# Patient Record
Sex: Female | Born: 1985 | Race: White | Hispanic: No | Smoking: Current every day smoker
Health system: Southern US, Community
[De-identification: ages and names within clinical notes are randomized; demographics above are authoritative.]

## PROBLEM LIST (undated history)

## (undated) ENCOUNTER — Inpatient Hospital Stay: Payer: Self-pay

## (undated) DIAGNOSIS — R51 Headache: Secondary | ICD-10-CM

## (undated) DIAGNOSIS — M797 Fibromyalgia: Secondary | ICD-10-CM

## (undated) DIAGNOSIS — M1990 Unspecified osteoarthritis, unspecified site: Secondary | ICD-10-CM

## (undated) DIAGNOSIS — F988 Other specified behavioral and emotional disorders with onset usually occurring in childhood and adolescence: Secondary | ICD-10-CM

## (undated) DIAGNOSIS — K219 Gastro-esophageal reflux disease without esophagitis: Secondary | ICD-10-CM

## (undated) DIAGNOSIS — J45909 Unspecified asthma, uncomplicated: Secondary | ICD-10-CM

## (undated) DIAGNOSIS — R519 Headache, unspecified: Secondary | ICD-10-CM

## (undated) DIAGNOSIS — F419 Anxiety disorder, unspecified: Secondary | ICD-10-CM

## (undated) DIAGNOSIS — T7840XA Allergy, unspecified, initial encounter: Secondary | ICD-10-CM

## (undated) HISTORY — PX: ESOPHAGOGASTRODUODENOSCOPY: SHX1529

## (undated) HISTORY — PX: WISDOM TOOTH EXTRACTION: SHX21

## (undated) HISTORY — DX: Unspecified asthma, uncomplicated: J45.909

## (undated) HISTORY — PX: SMALL INTESTINE SURGERY: SHX150

## (undated) HISTORY — PX: COLON SURGERY: SHX602

## (undated) HISTORY — DX: Anxiety disorder, unspecified: F41.9

## (undated) HISTORY — PX: TUBAL LIGATION: SHX77

## (undated) HISTORY — DX: Allergy, unspecified, initial encounter: T78.40XA

---

## 2017-06-10 NOTE — L&D Delivery Note (Addendum)
Date of delivery: 06/08/18 Estimated Date of Delivery: 06/01/18 Patient's last menstrual period was 08/25/2017. EGA: [redacted]w[redacted]d  Delivery Note At 12:14 AM a viable female was delivered via Vaginal, Spontaneous (Presentation: cephalic; right occiput transverse).  APGAR: 8, 9; weight pending while skin-to-skin.   Placenta status: spontaneous, intact.  Cord: 3vv with the following complications: none .  Cord pH: not collected  Anesthesia:  epidural Episiotomy: None Lacerations: 1st degree, hemostatic Suture Repair: none Est. Blood Loss (mL):  59cc  Mom presented to L&D with Induction of labor for past due date.  She was given cytotec, AROM'd for meconium, antibiotics for GBS+, then augmented with pitocin.  epidual placed.  Progressed to complete, second stage: 1hour labor down and then active 2nd stage 30 minutes.  Baby was delivered by RN while I was in another delivery.  Per her report, delivery of fetal head followed quickly by the rest of the body.  Baby placed on mom's chest, and attended to by peds. Cord was then clamped and cut by FOB.  I returned for the remainder of the procedure.  Placenta spontaneously delivered, intact.   IV pitocin given for hemorrhage prophylaxis.  Superficial 1st degree was not repaired as it was superficial and hemostatic.   We sang happy birthday to baby [place].   Mom to postpartum.  Baby to Couplet care / Skin to Skin.  Shina Wass C Dariah Mcsorley 06/08/2018, 1:06 AM

## 2017-10-27 LAB — OB RESULTS CONSOLE HIV ANTIBODY (ROUTINE TESTING): HIV: NONREACTIVE

## 2017-10-28 LAB — OB RESULTS CONSOLE RUBELLA ANTIBODY, IGM: Rubella: IMMUNE

## 2017-10-28 LAB — OB RESULTS CONSOLE VARICELLA ZOSTER ANTIBODY, IGG: Varicella: IMMUNE

## 2017-10-28 LAB — OB RESULTS CONSOLE ABO/RH: RH Type: POSITIVE

## 2017-10-28 LAB — OB RESULTS CONSOLE RPR: RPR: NONREACTIVE

## 2017-10-28 LAB — OB RESULTS CONSOLE HEPATITIS B SURFACE ANTIGEN: HEP B S AG: NEGATIVE

## 2017-10-28 LAB — OB RESULTS CONSOLE ANTIBODY SCREEN: Antibody Screen: NEGATIVE

## 2017-10-29 LAB — OB RESULTS CONSOLE GC/CHLAMYDIA
Chlamydia: NEGATIVE
GC PROBE AMP, GENITAL: NEGATIVE

## 2017-11-17 DIAGNOSIS — O3680X1 Pregnancy with inconclusive fetal viability, fetus 1: Secondary | ICD-10-CM | POA: Insufficient documentation

## 2018-03-02 LAB — OB RESULTS CONSOLE HIV ANTIBODY (ROUTINE TESTING): HIV: NONREACTIVE

## 2018-03-03 LAB — OB RESULTS CONSOLE RPR: RPR: NONREACTIVE

## 2018-05-12 LAB — OB RESULTS CONSOLE GC/CHLAMYDIA
Chlamydia: NEGATIVE
Gonorrhea: NEGATIVE

## 2018-05-13 LAB — OB RESULTS CONSOLE GBS: GBS: POSITIVE

## 2018-05-25 ENCOUNTER — Other Ambulatory Visit: Payer: Self-pay

## 2018-05-25 ENCOUNTER — Observation Stay
Admission: EM | Admit: 2018-05-25 | Discharge: 2018-05-25 | Disposition: A | Discharge status: Home / Self Care | Payer: Medicaid Other | Attending: Obstetrics and Gynecology | Admitting: Obstetrics and Gynecology

## 2018-05-25 DIAGNOSIS — Z3A39 39 weeks gestation of pregnancy: Secondary | ICD-10-CM | POA: Insufficient documentation

## 2018-05-25 DIAGNOSIS — O471 False labor at or after 37 completed weeks of gestation: Principal | ICD-10-CM | POA: Insufficient documentation

## 2018-05-25 MED ORDER — ACETAMINOPHEN 325 MG PO TABS: 650.0000 mg | ORAL_TABLET | ORAL | Status: DC | PRN

## 2018-05-25 MED ORDER — CALCIUM CARBONATE ANTACID 500 MG PO CHEW: 2.0000 | CHEWABLE_TABLET | ORAL | Status: DC | PRN

## 2018-05-25 MED ORDER — DOCUSATE SODIUM 100 MG PO CAPS: 100.0000 mg | ORAL_CAPSULE | Freq: Every day | ORAL | Status: DC

## 2018-05-25 MED ORDER — ZOLPIDEM TARTRATE 5 MG PO TABS: 5.0000 mg | ORAL_TABLET | Freq: Every evening | ORAL | Status: DC | PRN

## 2018-05-25 MED ORDER — PRENATAL MULTIVITAMIN CH: 1.0000 | ORAL_TABLET | Freq: Every day | ORAL | Status: DC

## 2018-05-25 NOTE — Final Progress Note (Signed)
TRIAGE VISIT with NST   Megan Bennett is a 32 y.o. G1P0. She is at 39+0 wks with EDC of 06/01/18 by LMP.12 wk ultrasound presenting with signs of labor.  Indication: Contractions  S: Resting comfortably. Intermittent CTX, no VB. Active fetal movement. Concerned about labor and discharge/fluid from vagina  O:  BP 125/81 (BP Location: Left Arm)   Pulse 98   Temp 98.3 F (36.8 C) (Oral)   Resp 16   Ht 5\' 7"  (1.702 m)   Wt 116.1 kg   BMI 40.10 kg/m  No results found for this or any previous visit (from the past 48 hour(s)).   Gen: NAD, AAOx3      Abd: FNTTP      Ext: Non-tender, Nonedmeatous    NST/FHT: 135, mod var, +accels, no decels TOCO: quiet EVO:JJKKXFGH: 1 Effacement (%): 40, 50 Cervical Position: Middle Station: -3 Exam by:: [employer] RN  NST: Reactive. See FHT above for particulars.  A/P:  32 y.o. G1P0  At   Labor: not present.   R/o ROM: SSE negative x 3. Wet prep neg for clue cells. Normal amount of white discharge.   Fetal Wellbeing: NST reactive Reassuring Cat 1 tracing.  D/c home stable, precautions reviewed, follow-up as scheduled.

## 2018-06-05 ENCOUNTER — Other Ambulatory Visit: Payer: Self-pay

## 2018-06-05 ENCOUNTER — Inpatient Hospital Stay
Admission: EM | Admit: 2018-06-05 | Discharge: 2018-06-05 | Disposition: A | Discharge status: Home / Self Care | Payer: Medicaid Other | Attending: Obstetrics and Gynecology | Admitting: Obstetrics and Gynecology

## 2018-06-05 DIAGNOSIS — O368130 Decreased fetal movements, third trimester, not applicable or unspecified: Secondary | ICD-10-CM | POA: Insufficient documentation

## 2018-06-05 DIAGNOSIS — Z3A41 41 weeks gestation of pregnancy: Secondary | ICD-10-CM | POA: Insufficient documentation

## 2018-06-05 DIAGNOSIS — O368190 Decreased fetal movements, unspecified trimester, not applicable or unspecified: Secondary | ICD-10-CM

## 2018-06-05 NOTE — OB Triage Note (Signed)
Pt presented to L/D with reported decreased fetal movement since the beginning of the week. No bleeding or leaking of fluid noted. Intermittent, sharp pelvic pain noted that has been present for two weeks. Last intercourse 05/30/18. Vitals stable. Will continue to monitor.

## 2018-06-05 NOTE — Final Progress Note (Signed)
Triage Visit with NST    SHAKIARA LUKIC is a 32 y.o. G1P0. She is at 41+0wks Indication: Decreased fetal movement  S: Resting comfortably. no CTX, no VB.  - Patient is now feeling baby  Move well but <10 movements in 1 hr Induction is scheduled for Sunday morning  :  BP 131/70   Pulse 78   Temp 98.4 F (36.9 C) (Oral)   Resp 18   Ht 5\' 7"  (1.702 m)   Wt 116.6 kg   LMP  (LMP Unknown)   BMI 40.25 kg/m  No results found for this or any previous visit (from the past 84 hour(s)).   Gen: NAD, AAOx3      Abd: FNTTP      Ext: Non-tender, Nonedmeatous    FHT: 135, moderate variability, +accels x2, no decels TOCO: quiet SVE: 3/60/-3, mid position and soft  Bedside AFI: 37.4MO, cephalic   A/P:  32 y.o. L0B8 at 41 wks with concerns for decreased fetal movement, now resolved.   Labor: not present.   Fetal Wellbeing: NST is Reassuring reactive tracing   D/c home stable, precautions reviewed, follow-up as scheduled.

## 2018-06-05 NOTE — Discharge Instructions (Signed)

## 2018-06-07 ENCOUNTER — Encounter: Payer: Self-pay | Admitting: *Deleted

## 2018-06-07 ENCOUNTER — Inpatient Hospital Stay
Admission: EM | Admit: 2018-06-07 | Discharge: 2018-06-09 | DRG: 807 | Disposition: A | Discharge status: Home / Self Care | Payer: Medicaid Other | Attending: Obstetrics & Gynecology | Admitting: Obstetrics & Gynecology

## 2018-06-07 ENCOUNTER — Inpatient Hospital Stay: Payer: Medicaid Other | Admitting: Anesthesiology

## 2018-06-07 ENCOUNTER — Other Ambulatory Visit: Payer: Self-pay

## 2018-06-07 DIAGNOSIS — O134 Gestational [pregnancy-induced] hypertension without significant proteinuria, complicating childbirth: Secondary | ICD-10-CM | POA: Diagnosis present

## 2018-06-07 DIAGNOSIS — O99334 Smoking (tobacco) complicating childbirth: Secondary | ICD-10-CM | POA: Diagnosis present

## 2018-06-07 DIAGNOSIS — O99214 Obesity complicating childbirth: Secondary | ICD-10-CM | POA: Diagnosis present

## 2018-06-07 DIAGNOSIS — F17210 Nicotine dependence, cigarettes, uncomplicated: Secondary | ICD-10-CM | POA: Diagnosis present

## 2018-06-07 DIAGNOSIS — Z3A40 40 weeks gestation of pregnancy: Secondary | ICD-10-CM | POA: Diagnosis not present

## 2018-06-07 DIAGNOSIS — O480 Post-term pregnancy: Secondary | ICD-10-CM | POA: Diagnosis present

## 2018-06-07 DIAGNOSIS — O99824 Streptococcus B carrier state complicating childbirth: Secondary | ICD-10-CM | POA: Diagnosis present

## 2018-06-07 HISTORY — DX: Fibromyalgia: M79.7

## 2018-06-07 LAB — COMPREHENSIVE METABOLIC PANEL
ALT: 15 U/L (ref 0–44)
AST: 18 U/L (ref 15–41)
Albumin: 2.9 g/dL — ABNORMAL LOW (ref 3.5–5.0)
Alkaline Phosphatase: 112 U/L (ref 38–126)
Anion gap: 8 (ref 5–15)
BUN: 8 mg/dL (ref 6–20)
CHLORIDE: 108 mmol/L (ref 98–111)
CO2: 18 mmol/L — AB (ref 22–32)
Calcium: 9.3 mg/dL (ref 8.9–10.3)
Creatinine, Ser: 0.49 mg/dL (ref 0.44–1.00)
GFR calc Af Amer: >60 mL/min (ref >60)
GFR calc non Af Amer: >60 mL/min (ref >60)
Glucose, Bld: 86 mg/dL (ref 70–99)
Potassium: 3.9 mmol/L (ref 3.5–5.1)
Sodium: 134 mmol/L — ABNORMAL LOW (ref 135–145)
Total Bilirubin: 0.7 mg/dL (ref 0.3–1.2)
Total Protein: 6.3 g/dL — ABNORMAL LOW (ref 6.5–8.1)

## 2018-06-07 LAB — PROTEIN / CREATININE RATIO, URINE
Creatinine, Urine: 16 mg/dL
Total Protein, Urine: <6 mg/dL

## 2018-06-07 LAB — CBC
HCT: 42.1 % (ref 36.0–46.0)
Hemoglobin: 14.5 g/dL (ref 12.0–15.0)
MCH: 33.5 pg (ref 26.0–34.0)
MCHC: 34.4 g/dL (ref 30.0–36.0)
MCV: 97.2 fL (ref 80.0–100.0)
PLATELETS: 289 10*3/uL (ref 150–400)
RBC: 4.33 MIL/uL (ref 3.87–5.11)
RDW: 12.7 % (ref 11.5–15.5)
WBC: 17.6 10*3/uL — AB (ref 4.0–10.5)
nRBC: 0 % (ref 0.0–0.2)

## 2018-06-07 LAB — TYPE AND SCREEN
ABO/RH(D): O POS
Antibody Screen: NEGATIVE

## 2018-06-07 MED ORDER — PHENYLEPHRINE 40 MCG/ML (10ML) SYRINGE FOR IV PUSH (FOR BLOOD PRESSURE SUPPORT): 80.0000 ug | PREFILLED_SYRINGE | INTRAVENOUS | Status: DC | PRN

## 2018-06-07 MED ORDER — ACETAMINOPHEN 325 MG PO TABS: 650.0000 mg | ORAL_TABLET | ORAL | Status: DC | PRN

## 2018-06-07 MED ORDER — MISOPROSTOL 25 MCG QUARTER TABLET
25.0000 ug | ORAL_TABLET | Freq: Once | ORAL | Status: AC
  Administered 2018-06-07: 25 ug via VAGINAL
  Filled 2018-06-07: qty 1

## 2018-06-07 MED ORDER — DIPHENHYDRAMINE HCL 50 MG/ML IJ SOLN: 12.5000 mg | INTRAMUSCULAR | Status: DC | PRN

## 2018-06-07 MED ORDER — LACTATED RINGERS IV SOLN
500.0000 mL | Freq: Once | INTRAVENOUS | Status: AC
  Administered 2018-06-07: 500 mL via INTRAVENOUS

## 2018-06-07 MED ORDER — OXYTOCIN 40 UNITS IN LACTATED RINGERS INFUSION - SIMPLE MED: 2.5000 [IU]/h | INTRAVENOUS | Status: DC

## 2018-06-07 MED ORDER — AMMONIA AROMATIC IN INHA
RESPIRATORY_TRACT | Status: AC
  Filled 2018-06-07: qty 10

## 2018-06-07 MED ORDER — OXYTOCIN BOLUS FROM INFUSION
500.0000 mL | Freq: Once | INTRAVENOUS | Status: AC
  Administered 2018-06-08: 500 mL via INTRAVENOUS

## 2018-06-07 MED ORDER — MISOPROSTOL 25 MCG QUARTER TABLET
25.0000 ug | ORAL_TABLET | Freq: Once | ORAL | Status: AC
  Administered 2018-06-07: 25 ug via BUCCAL
  Filled 2018-06-07: qty 1

## 2018-06-07 MED ORDER — ONDANSETRON HCL 4 MG/2ML IJ SOLN: 4.0000 mg | Freq: Four times a day (QID) | INTRAMUSCULAR | Status: DC | PRN

## 2018-06-07 MED ORDER — EPHEDRINE 5 MG/ML INJ: 10.0000 mg | INTRAVENOUS | Status: DC | PRN

## 2018-06-07 MED ORDER — LIDOCAINE HCL (PF) 1 % IJ SOLN
INTRAMUSCULAR | Status: AC
  Filled 2018-06-07: qty 30

## 2018-06-07 MED ORDER — OXYTOCIN 40 UNITS IN LACTATED RINGERS INFUSION - SIMPLE MED
1.0000 m[IU]/min | INTRAVENOUS | Status: DC
  Administered 2018-06-07: 1 m[IU]/min via INTRAVENOUS

## 2018-06-07 MED ORDER — MISOPROSTOL 200 MCG PO TABS
ORAL_TABLET | ORAL | Status: AC
  Administered 2018-06-07: 25 ug via BUCCAL
  Filled 2018-06-07: qty 4

## 2018-06-07 MED ORDER — LIDOCAINE HCL (PF) 1 % IJ SOLN: 30.0000 mL | INTRAMUSCULAR | Status: DC | PRN

## 2018-06-07 MED ORDER — FENTANYL 2.5 MCG/ML W/ROPIVACAINE 0.15% IN NS 100 ML EPIDURAL (ARMC)
EPIDURAL | Status: AC
  Filled 2018-06-07: qty 100

## 2018-06-07 MED ORDER — BUPIVACAINE HCL (PF) 0.25 % IJ SOLN
INTRAMUSCULAR | Status: DC | PRN
  Administered 2018-06-07: 5 mL via EPIDURAL

## 2018-06-07 MED ORDER — OXYTOCIN 10 UNIT/ML IJ SOLN
INTRAMUSCULAR | Status: AC
  Filled 2018-06-07: qty 2

## 2018-06-07 MED ORDER — CEFAZOLIN SODIUM-DEXTROSE 2-4 GM/100ML-% IV SOLN
2.0000 g | Freq: Four times a day (QID) | INTRAVENOUS | Status: DC
  Administered 2018-06-07 (×2): 2 g via INTRAVENOUS
  Filled 2018-06-07 (×5): qty 100

## 2018-06-07 MED ORDER — FENTANYL 2.5 MCG/ML W/ROPIVACAINE 0.15% IN NS 100 ML EPIDURAL (ARMC)
12.0000 mL/h | EPIDURAL | Status: DC
  Administered 2018-06-07: 12 mL/h via EPIDURAL

## 2018-06-07 MED ORDER — OXYTOCIN 40 UNITS IN LACTATED RINGERS INFUSION - SIMPLE MED
INTRAVENOUS | Status: AC
  Administered 2018-06-07: 1 m[IU]/min via INTRAVENOUS
  Filled 2018-06-07: qty 1000

## 2018-06-07 MED ORDER — LACTATED RINGERS IV SOLN: 500.0000 mL | INTRAVENOUS | Status: DC | PRN

## 2018-06-07 MED ORDER — TERBUTALINE SULFATE 1 MG/ML IJ SOLN: 0.2500 mg | Freq: Once | INTRAMUSCULAR | Status: DC | PRN

## 2018-06-07 MED ORDER — SOD CITRATE-CITRIC ACID 500-334 MG/5ML PO SOLN: 30.0000 mL | ORAL | Status: DC | PRN

## 2018-06-07 MED ORDER — LACTATED RINGERS IV SOLN
INTRAVENOUS | Status: DC
  Administered 2018-06-07 (×3): via INTRAVENOUS

## 2018-06-07 NOTE — Progress Notes (Signed)
Intrapartum progress note:  S: comfortable after epidural, not feeling pressure or contractions  O: BP 121/75 (BP Location: Right Arm)   Pulse 63   Temp 98.6 F (37 C) (Oral)   Resp 18   Ht 5\' 7"  (1.702 m)   Wt 116.6 kg   LMP 08/25/2017   SpO2 97%   BMI 40.25 kg/m   SVE: 10/100/0 FHT: 155 mod + accels no decels TOCO: q54min, pit at 10  A/P: 32yo G1P0 @ 40+6 with IOL for past due date  1. IUP: category 1 2. GHTN no s/sx preeclampsia 3. IOL: s/p cytotec, AROM and pitocin.  Complete.  Started pushing with minimal movement.  Will labor down with peanut ball.    ----- Larey Days, MD Attending Obstetrician and Gynecologist [facility], Department of [facility]

## 2018-06-07 NOTE — Anesthesia Procedure Notes (Signed)
Epidural Patient location during procedure: OB  Staffing Anesthesiologist: Bryna Razavi, MD Performed: anesthesiologist   Preanesthetic Checklist Completed: patient identified, site marked, surgical consent, pre-op evaluation, timeout performed, IV checked, risks and benefits discussed and monitors and equipment checked  Epidural Patient position: sitting Prep: ChloraPrep Patient monitoring: heart rate, continuous pulse ox and blood pressure Approach: midline Location: L4-L5 Injection technique: LOR saline  Needle:  Needle type: Tuohy  Needle gauge: 18 G Needle length: 9 cm and 9 Catheter type: closed end flexible Catheter size: 20 Guage Test dose: negative and 1.5% lidocaine with Epi 1:200 K  Assessment Sensory level: T10 Events: blood not aspirated, injection not painful, no injection resistance, negative IV test and no paresthesia  Additional Notes   Patient tolerated the insertion well without complications.Reason for block:procedure for pain     

## 2018-06-07 NOTE — H&P (Addendum)
OB History & Physical   History of Present Illness:  Chief Complaint:   HPI:  Megan Bennett is a 32 y.o. G1P0 female at [redacted]w[redacted]d dated by LMP consistent with 13wk [place]. Patient's last menstrual period was 08/25/2017. Estimated Date of Delivery: 06/01/18  She presents to L&D for scheduled induction of labor for past due date  +FM, rare CTX, no LOF, no VB  Pregnancy Issues: 1. Smoker 2. GBS+ 3. Obesity, BMI 36  Maternal Medical History:   Past Medical History:  Diagnosis Date  . Fibromyalgia     Past Surgical History:  Procedure Laterality Date  . WISDOM TOOTH EXTRACTION Bilateral     Allergies  Allergen Reactions  . Amoxicillin Rash  . Wellbutrin [Bupropion] Other (See Comments)    shakey    Prior to Admission medications   Medication Sig Start Date End Date Taking? Authorizing Provider  omeprazole (PRILOSEC) 10 MG capsule Take 10 mg by mouth daily.   Yes [provider]  Prenatal Vit-Fe Fumarate-FA (MULTIVITAMIN-PRENATAL) 27-0.8 MG TABS tablet Take 1 tablet by mouth daily at 12 noon.   Yes [provider]     Prenatal care site: [facility]   Social History: She  reports that she has been smoking cigarettes. She has been smoking about 0.25 packs per day. She has never used smokeless tobacco. She reports current alcohol use of about 1.0 standard drinks of alcohol per week. She reports previous drug use.  Family History: no GYN cancers  Review of Systems: A full review of systems was performed and negative except as noted in the HPI.     Physical Exam:  Vital Signs: BP (!) 151/67 (BP Location: Left Arm)   Pulse 65   Temp 98.1 F (36.7 C) (Oral)   Resp 20   LMP 08/25/2017  General: no acute distress.  HEENT: normocephalic, atraumatic Heart: regular rate & rhythm.  No murmurs/rubs/gallops Lungs: clear to auscultation bilaterally, normal respiratory effort Abdomen: soft, gravid, non-tender;  EFW: 7lb 12oz Pelvic:    External: Normal external female genitalia  Cervix: 3/60/-3 on admission   Extremities: non-tender, symmetric, mild edema bilaterally.  DTRs: 2+  Neurologic: Alert & oriented x 3.    Results for orders placed or performed during the hospital encounter of 06/07/18 (from the past 24 hour(s))  CBC     Status: Abnormal   Collection Time: 06/07/18  9:25 AM  Result Value Ref Range   WBC 17.6 (H) 4.0 - 10.5 K/uL   RBC 4.33 3.87 - 5.11 MIL/uL   Hemoglobin 14.5 12.0 - 15.0 g/dL   HCT 42.1 36.0 - 46.0 %   MCV 97.2 80.0 - 100.0 fL   MCH 33.5 26.0 - 34.0 pg   MCHC 34.4 30.0 - 36.0 g/dL   RDW 12.7 11.5 - 15.5 %   Platelets 289 150 - 400 K/uL   nRBC 0.0 0.0 - 0.2 %  Comprehensive metabolic panel     Status: Abnormal   Collection Time: 06/07/18  9:25 AM  Result Value Ref Range   Sodium 134 (L) 135 - 145 mmol/L   Potassium 3.9 3.5 - 5.1 mmol/L   Chloride 108 98 - 111 mmol/L   CO2 18 (L) 22 - 32 mmol/L   Glucose, Bld 86 70 - 99 mg/dL   BUN 8 6 - 20 mg/dL   Creatinine, Ser 0.49 0.44 - 1.00 mg/dL   Calcium 9.3 8.9 - 10.3 mg/dL   Total Protein 6.3 (L) 6.5 - 8.1  g/dL   Albumin 2.9 (L) 3.5 - 5.0 g/dL   AST 18 15 - 41 U/L   ALT 15 0 - 44 U/L   Alkaline Phosphatase 112 38 - 126 U/L   Total Bilirubin 0.7 0.3 - 1.2 mg/dL   GFR calc non Af Amer >60 >60 mL/min   GFR calc Af Amer >60 >60 mL/min   Anion gap 8 5 - 15  Protein / creatinine ratio, urine     Status: None   Collection Time: 06/07/18  9:28 AM  Result Value Ref Range   Creatinine, Urine 16 mg/dL   Total Protein, Urine <6 mg/dL   Protein Creatinine Ratio        0.00 - 0.15 mg/mg[Cre]  Type and screen     Status: None   Collection Time: 06/07/18 10:28 AM  Result Value Ref Range   ABO/RH(D) O POS    Antibody Screen NEG    Sample Expiration      06/10/2018 Performed at [facility], [address] [place], [place] [address]     Pertinent Results:  Prenatal Labs: Blood type/Rh O+  Antibody screen neg  Rubella Immune   Varicella Immune  RPR NR  HBsAg Neg  HIV NR  GC neg  Chlamydia neg  Genetic screening declined  1 hour GTT 65  3 hour GTT   GBS Positive 05/13/2018   FHT: 145 mod + accels no decels TOCO: occasional   Cephalic by leopolds   Assessment:  Megan Bennett is a 32 y.o. G1P0 female at [redacted]w[redacted]d with IOL for past due date.   Plan:  1. Admit to Labor & Delivery 2. CBC, T&S, Clrs, IVF 3. GBS  Positive, start ancef once recurrent contractions occur 4. Consents obtained. 5. Continuous efm/toco 6. Category 1 7. IOL: start with cytotec.  Will add pitocin , AROM prn. 8. Elevated BP: LFTs wnl, PLT wnl, P/C ratio unable to calculate.  Will trend BP for dx of gestational hypertension.  ----- Larey Days, MD Attending Obstetrician and Gynecologist [facility], Department of [facility]

## 2018-06-07 NOTE — Progress Notes (Signed)
Intrapartum progress note  S:  Starting to feel uncomfortable with contractions Denies: HA, visual changes, SOB, or RUQ/epigastric pain  O: BP (!) 151/67 (BP Location: Left Arm)   Pulse 65   Temp 98.1 F (36.7 C) (Oral)   Resp 20   LMP 08/25/2017   FHT: 140 mod + accels no decels TOCO: q2 min SVE: 4/80/-2 AROM for mec (thin)  A/P: 32yo G1P0 @ 40+6wks with IOL for past due dates and gestational hypertension  1. GHTN: no s/sx preeclampsia, has not met criteria.  Will monitor for worsening BPs or symptoms. 2. IUP: category 1 3. IOL: s/p cytotec x2, now with regular contractions. AROM'd for thin meconium. Will hold pitocin and see what her body does at this point.  If needed, will start pitocin for augmentation. 4. GBS+:  Allergic to amoxicillin, rash.  Will start ANCEF now.  ----- Larey Days, MD Attending Obstetrician and Gynecologist [facility], Department of [facility]

## 2018-06-07 NOTE — Anesthesia Preprocedure Evaluation (Signed)
Anesthesia Evaluation  Patient identified by MRN, date of birth, ID band Patient awake    Reviewed: Allergy & Precautions, NPO status , Patient's Chart, lab work & pertinent test results, reviewed documented beta blocker date and time   Airway Mallampati: III  TM Distance: >3 FB     Dental  (+) Chipped   Pulmonary Current Smoker,           Cardiovascular      Neuro/Psych  Neuromuscular disease    GI/Hepatic   Endo/Other  Morbid obesity  Renal/GU      Musculoskeletal  (+) Fibromyalgia -  Abdominal   Peds  Hematology   Anesthesia Other Findings   Reproductive/Obstetrics                             Anesthesia Physical Anesthesia Plan  ASA: III  Anesthesia Plan: Epidural   Post-op Pain Management:    Induction:   PONV Risk Score and Plan:   Airway Management Planned:   Additional Equipment:   Intra-op Plan:   Post-operative Plan:   Informed Consent: I have reviewed the patients History and Physical, chart, labs and discussed the procedure including the risks, benefits and alternatives for the proposed anesthesia with the patient or authorized representative who has indicated his/her understanding and acceptance.     Plan Discussed with: CRNA  Anesthesia Plan Comments:         Anesthesia Quick Evaluation

## 2018-06-08 LAB — CBC
HCT: 37.5 % (ref 36.0–46.0)
Hemoglobin: 12.6 g/dL (ref 12.0–15.0)
MCH: 33.4 pg (ref 26.0–34.0)
MCHC: 33.6 g/dL (ref 30.0–36.0)
MCV: 99.5 fL (ref 80.0–100.0)
NRBC: 0 % (ref 0.0–0.2)
Platelets: 258 10*3/uL (ref 150–400)
RBC: 3.77 MIL/uL — AB (ref 3.87–5.11)
RDW: 12.9 % (ref 11.5–15.5)
WBC: 26.4 10*3/uL — ABNORMAL HIGH (ref 4.0–10.5)

## 2018-06-08 MED ORDER — ONDANSETRON HCL 4 MG PO TABS: 4.0000 mg | ORAL_TABLET | ORAL | Status: DC | PRN

## 2018-06-08 MED ORDER — SIMETHICONE 80 MG PO CHEW: 80.0000 mg | CHEWABLE_TABLET | ORAL | Status: DC | PRN

## 2018-06-08 MED ORDER — DIPHENHYDRAMINE HCL 25 MG PO CAPS: 25.0000 mg | ORAL_CAPSULE | Freq: Four times a day (QID) | ORAL | Status: DC | PRN

## 2018-06-08 MED ORDER — WITCH HAZEL-GLYCERIN EX PADS
1.0000 "application " | MEDICATED_PAD | CUTANEOUS | Status: DC
  Administered 2018-06-08: 1 via TOPICAL
  Filled 2018-06-08: qty 100

## 2018-06-08 MED ORDER — ONDANSETRON HCL 4 MG/2ML IJ SOLN: 4.0000 mg | INTRAMUSCULAR | Status: DC | PRN

## 2018-06-08 MED ORDER — HYDROCORTISONE 2.5 % RE CREA
TOPICAL_CREAM | RECTAL | Status: DC
  Administered 2018-06-08: 23:00:00 via TOPICAL
  Filled 2018-06-08 (×3): qty 28.35

## 2018-06-08 MED ORDER — IBUPROFEN 600 MG PO TABS
600.0000 mg | ORAL_TABLET | Freq: Four times a day (QID) | ORAL | Status: DC
  Administered 2018-06-08 – 2018-06-09 (×5): 600 mg via ORAL
  Filled 2018-06-08 (×5): qty 1

## 2018-06-08 MED ORDER — BENZOCAINE-MENTHOL 20-0.5 % EX AERO
1.0000 "application " | INHALATION_SPRAY | CUTANEOUS | Status: DC | PRN
  Administered 2018-06-08: 1 via TOPICAL
  Filled 2018-06-08: qty 56

## 2018-06-08 MED ORDER — COCONUT OIL OIL
1.0000 "application " | TOPICAL_OIL | Status: DC | PRN
  Administered 2018-06-08: 1 via TOPICAL
  Filled 2018-06-08: qty 120

## 2018-06-08 MED ORDER — DOCUSATE SODIUM 100 MG PO CAPS
100.0000 mg | ORAL_CAPSULE | Freq: Two times a day (BID) | ORAL | Status: DC
  Administered 2018-06-08: 100 mg via ORAL
  Filled 2018-06-08: qty 1

## 2018-06-08 MED ORDER — PRENATAL MULTIVITAMIN CH
1.0000 | ORAL_TABLET | Freq: Every day | ORAL | Status: DC
  Administered 2018-06-08: 1 via ORAL
  Filled 2018-06-08: qty 1

## 2018-06-08 MED ORDER — ACETAMINOPHEN 500 MG PO TABS
1000.0000 mg | ORAL_TABLET | Freq: Four times a day (QID) | ORAL | Status: DC | PRN
  Administered 2018-06-08 (×2): 1000 mg via ORAL
  Filled 2018-06-08 (×2): qty 2

## 2018-06-08 NOTE — Lactation Note (Signed)
This note was copied from a baby's chart. Lactation Consultation Note  Patient Name: Megan Bennett CVELF'Y Date: 06/08/2018 Reason for consult: Initial assessment;Mother's request   Maternal Data Has patient been taught Hand Expression?: Yes Does the patient have breastfeeding experience prior to this delivery?: No  Feeding Feeding Type: Breast Fed  LATCH Score Latch: Repeated attempts needed to sustain latch, nipple held in mouth throughout feeding, stimulation needed to elicit sucking reflex.  Audible Swallowing: A few with stimulation  Type of Nipple: Everted at rest and after stimulation  Comfort (Breast/Nipple): Soft / non-tender  Hold (Positioning): Full assist, staff holds infant at breast  LATCH Score: 6  Interventions Interventions: Breast feeding basics reviewed;Assisted with latch;Skin to skin;Hand express;Support pillows;Position options  Lactation Tools Discussed/Used     Consult Status Consult Status: PRN First time parents are eager to breastfeed baby. Parents have lots of family support to assist with nursing. Grandmother of baby breastfeed mother and aunt for over 38mos and was encouraging MOB while LC was in the room. [place] assisted the parents with feeding "Arlan Organ" on mom's left breast which is "more difficult" for baby to latch on according to parents.   LC observed that baby wasn't quite ready to feed upon entering the room so LC placed baby skin to skin with mother and did education on: feeding cues, skin to skin, clusterfeeding, signs of fullness, and support/resources after d/c.   After approx. 69min of skin to skin, baby had a huge stool and void, and then baby immediately latched on in the cradle position with mom sandwiching her breasts and LC assisting with baby's body's position. "Arlan Organ" opened her mouth wide and began nursing. Mom complained that suction was too strong so LC pulled down on baby's chin with finger to loosen grip.  Mom said this felt better. Baby stayed latched onto mom's breast for 64mins with a few audible swallows.   LC will f/u with family in the afternoon, but they didn't have any breastfeeding questions or concerns at the current time.   Marnee Spring 06/08/2018, 11:10 AM

## 2018-06-08 NOTE — Discharge Summary (Signed)
Obstetrical Discharge Summary  Patient Name: Megan Bennett DOB: 1985-08-15 MRN: [id number]  Date of Admission: 06/07/2018 Date of Delivery: 06/08/18 Delivered by: Larey Days, MD, and Debbe Odea, RN Date of Discharge: 06/09/2018  Primary OB:  [facility]  [id number] last menstrual period was 08/25/2017. EDC Estimated Date of Delivery: 06/01/18 Gestational Age at Delivery: [redacted]w[redacted]d   Antepartum complications:  1. Smoker 2. Obesity 3. GBS+  Admitting Diagnosis: induction of labor for past due date Secondary Diagnosis: Patient Active Problem List   Diagnosis Date Noted  . Indication for care in labor or delivery 05/25/2018    Augmentation: AROM, Pitocin and Cytotec Complications: None Intrapartum complications/course: Mom presented to L&D with Induction of labor for past due date.  She was given cytotec, AROM'd for meconium, antibiotics for GBS+, then augmented with pitocin.  epidual placed.  Progressed to complete, second stage: 1hour labor down and then active 2nd stage 30 minutes.  Baby was delivered by RN while I was in another delivery.  Per her report, delivery of fetal head followed quickly by the rest of the body.  Baby placed on mom's chest, and attended to by peds. Cord was then clamped and cut by FOB.  I returned for the remainder of the procedure.  Placenta spontaneously delivered, intact.   IV pitocin given for hemorrhage prophylaxis.  Superficial 1st degree was not repaired as it was superficial and hemostatic. Date of Delivery: 06/08/18 Delivered By: Vikki Ports Ward Delivery Type: spontaneous vaginal delivery Anesthesia: epidural Placenta: spontaneous Laceration: 1st degree Episiotomy: none Newborn Data: Live born female "Bella" Birth Weight:  7#4 APGAR: 8, 9   Newborn Delivery   Birth date/time:  06/08/2018 00:14:00 Delivery type:  Vaginal, Spontaneous      Postpartum Procedures: none  Post partum course:  Patient had an uncomplicated postpartum  course.  By time of discharge on PPD#1, her pain was controlled on oral pain medications; she had appropriate lochia and was ambulating, voiding without difficulty and tolerating regular diet.  She was deemed stable for discharge to home.     Discharge Physical Exam: 06/09/18 at 0750 BP 113/73 (BP Location: Left Arm)   Pulse (!) 55 Comment: nurse notified  Temp 98 F (36.7 C) (Oral)   Resp 20   Ht 5\' 7"  (1.702 m)   Wt 116.6 kg   LMP 08/25/2017   SpO2 98%   Breastfeeding Unknown   BMI 40.25 kg/m   General: NAD CV: RRR Pulm: CTABL, nl effort ABD: s/nd/nt, fundus firm and below the umbilicus Lochia: moderate DVT Evaluation: LE non-ttp, no evidence of DVT on exam.  Hemoglobin  Date Value Ref Range Status  06/08/2018 12.6 12.0 - 15.0 g/dL Final   HCT  Date Value Ref Range Status  06/08/2018 37.5 36.0 - 46.0 % Final     Disposition: stable, discharge to home. Baby Feeding: breastmilk Baby Disposition: home with mom  Rh Immune globulin given: n/a Rubella vaccine given: n/a Tdap vaccine given in AP or PP setting: AP Flu vaccine given in AP or PP setting: AP  Contraception: interval tubal  Prenatal Labs:  Blood type/Rh O+  Antibody screen neg  Rubella Immune  Varicella Immune  RPR NR  HBsAg Neg  HIV NR  GC neg  Chlamydia neg  Genetic screening declined  1 hour GTT 65  3 hour GTT   GBS Positive 05/13/2018     Plan:  Megan Bennett was discharged to home in good condition. Follow-up appointment with Dr. Leonides Schanz in 3 weeks to  schedule tubal ligation.  Discharge Medications: Allergies as of 06/09/2018      Reactions   Amoxicillin Rash   Wellbutrin [bupropion] Other (See Comments)   shakey      Medication List    TAKE these medications   acetaminophen 500 MG tablet Commonly known as:  TYLENOL Take 2 tablets (1,000 mg total) by mouth every 6 (six) hours as needed (for pain scale < 4).   docusate sodium 100 MG capsule Commonly known as:   COLACE Take 1 capsule (100 mg total) by mouth 2 (two) times daily.   ibuprofen 600 MG tablet Commonly known as:  ADVIL,MOTRIN Take 1 tablet (600 mg total) by mouth every 6 (six) hours.   multivitamin-prenatal 27-0.8 MG Tabs tablet Take 1 tablet by mouth daily at 12 noon.   omeprazole 10 MG capsule Commonly known as:  PRILOSEC Take 10 mg by mouth daily.       Follow-up Information    Ward, Honor Loh, MD Follow up in 2 week(s).   Specialty:  Obstetrics and Gynecology Why:  pre-op BTL consult Contact information: [place]  [address] [phone]           Signed: Francetta Found, CNM 06/09/2018 7:57 AM

## 2018-06-09 LAB — RPR: RPR Ser Ql: NONREACTIVE

## 2018-06-09 MED ORDER — DOCUSATE SODIUM 100 MG PO CAPS: 100.0000 mg | ORAL_CAPSULE | Freq: Two times a day (BID) | ORAL | 0 refills | Status: DC

## 2018-06-09 MED ORDER — ACETAMINOPHEN 500 MG PO TABS: 1000.0000 mg | ORAL_TABLET | Freq: Four times a day (QID) | ORAL | 0 refills | Status: DC | PRN

## 2018-06-09 MED ORDER — IBUPROFEN 600 MG PO TABS: 600.0000 mg | ORAL_TABLET | Freq: Four times a day (QID) | ORAL | 0 refills | Status: DC

## 2018-06-09 NOTE — Anesthesia Postprocedure Evaluation (Signed)
Anesthesia Post Note  Patient: Megan Bennett  Procedure(s) Performed: AN AD HOC LABOR EPIDURAL  Patient location during evaluation: Mother Baby Anesthesia Type: Epidural Level of consciousness: awake, awake and alert, oriented and patient cooperative Pain management: pain level controlled Vital Signs Assessment: post-procedure vital signs reviewed and stable Respiratory status: spontaneous breathing, nonlabored ventilation and respiratory function stable Cardiovascular status: stable Postop Assessment: no headache, no backache, able to ambulate, adequate PO intake, no apparent nausea or vomiting and patient able to bend at knees Anesthetic complications: no     Last Vitals:  Vitals:   06/08/18 1926 06/08/18 2311  BP: 112/70 113/73  Pulse: 72 (!) 55  Resp: (!) 22 20  Temp: 36.6 C 36.7 C  SpO2: 95% 98%    Last Pain:  Vitals:   06/08/18 2330  TempSrc:   PainSc: 4                  Antrell Tipler,  Amarrah Meinhart R

## 2018-06-09 NOTE — Progress Notes (Signed)
Pt discharged with infant.  Discharge instructions, prescriptions and follow up appointment given to and reviewed with pt. Pt verbalized understanding. Escorted out by auxillary. 

## 2018-06-09 NOTE — Plan of Care (Signed)
Vs stable; up ad lib; taking motrin and tylenol for pain control; has ice packs, dermplast, tucks and hydrocortisone cream for perineum; tolerating regular diet

## 2018-07-01 ENCOUNTER — Other Ambulatory Visit: Payer: Self-pay

## 2018-07-01 ENCOUNTER — Encounter
Admission: RE | Admit: 2018-07-01 | Discharge: 2018-07-01 | Disposition: A | Discharge status: Home / Self Care | Payer: Medicaid Other | Source: Ambulatory Visit | Attending: Obstetrics & Gynecology | Admitting: Obstetrics & Gynecology

## 2018-07-01 HISTORY — DX: Gastro-esophageal reflux disease without esophagitis: K21.9

## 2018-07-01 HISTORY — DX: Other specified behavioral and emotional disorders with onset usually occurring in childhood and adolescence: F98.8

## 2018-07-01 HISTORY — DX: Unspecified osteoarthritis, unspecified site: M19.90

## 2018-07-01 HISTORY — DX: Headache, unspecified: R51.9

## 2018-07-01 HISTORY — DX: Headache: R51

## 2018-07-01 NOTE — Patient Instructions (Signed)
Your procedure is scheduled on: 07-06-18 Report to Same Day Surgery 2nd floor medical mall [facility] Entrance-take elevator on left to 2nd floor.  Check in with surgery information desk.) To find out your arrival time please call [phone] between 1PM - 3PM on 07-03-18  Remember: Instructions that are not followed completely may result in serious medical risk, up to and including death, or upon the discretion of your surgeon and anesthesiologist your surgery may need to be rescheduled.    _x___ 1. Do not eat food after midnight the night before your procedure. You may drink clear liquids up to 2 hours before you are scheduled to arrive at the hospital for your procedure.  Do not drink clear liquids within 2 hours of your scheduled arrival to the hospital.  Clear liquids include  --Water or Apple juice without pulp  --Clear carbohydrate beverage such as ClearFast or Gatorade  --Black Coffee or Clear Tea (No milk, no creamers, do not add anything to the coffee or Tea   ____Ensure clear carbohydrate drink on the way to the hospital for bariatric patients  ____Ensure clear carbohydrate drink 3 hours before surgery for Dr Dwyane Luo patients if physician instructed.   No gum chewing or hard candies.     __x__ 2. No Alcohol for 24 hours before or after surgery.   __x__3. No Smoking or e-cigarettes for 24 prior to surgery.  Do not use any chewable tobacco products for at least 6 hour prior to surgery   ____  4. Bring all medications with you on the day of surgery if instructed.    __x__ 5. Notify your doctor if there is any change in your medical condition     (cold, fever, infections).    x___6. On the morning of surgery brush your teeth with toothpaste and water.  You may rinse your mouth with mouth wash if you wish.  Do not swallow any toothpaste or mouthwash.   Do not wear jewelry, make-up, hairpins, clips or nail polish.  Do not wear lotions, powders, or perfumes. You may wear  deodorant.  Do not shave 48 hours prior to surgery. Men may shave face and neck.  Do not bring valuables to the hospital.    [facility] is not responsible for any belongings or valuables.               Contacts, dentures or bridgework may not be worn into surgery.  Leave your suitcase in the car. After surgery it may be brought to your room.  For patients admitted to the hospital, discharge time is determined by your treatment team.  _  Patients discharged the day of surgery will not be allowed to drive home.  You will need someone to drive you home and stay with you the night of your procedure.    Please read over the following fact sheets that you were given:   [facility] Preparing for Surgery  _x___ TAKE THE FOLLOWING MEDICATION THE MORNING OF SURGERY WITH A SMALL SIP OF WATER. These include:  1. PRILOSEC  2. TAKE AN EXTRA PRILOSEC THE NIGHT BEFORE SURGERY  3.  4.  5.  6.  ____Fleets enema or Magnesium Citrate as directed.   ____ Use CHG Soap or sage wipes as directed on instruction sheet   ____ Use inhalers on the day of surgery and bring to hospital day of surgery  ____ Stop Metformin and Janumet 2 days prior to surgery.    ____ Take 1/2 of  usual insulin dose the night before surgery and none on the morning surgery.   ____ Follow recommendations from Cardiologist, Pulmonologist or PCP regarding stopping Aspirin, Coumadin, Plavix ,Eliquis, Effient, or Pradaxa, and Pletal.  X____Stop Anti-inflammatories such as Advil, Aleve, Ibuprofen, Motrin, Naproxen, Naprosyn, Goodies powders or aspirin products NOW-OK to take Tylenol    ____ Stop supplements until after surgery.     ____ Bring C-Pap to the hospital.

## 2018-07-06 ENCOUNTER — Ambulatory Visit
Admission: RE | Admit: 2018-07-06 | Payer: Medicaid Other | Source: Home / Self Care | Admitting: Obstetrics & Gynecology

## 2018-07-06 ENCOUNTER — Encounter: Admission: RE | Payer: Self-pay | Source: Home / Self Care

## 2018-07-06 SURGERY — LIGATION, FALLOPIAN TUBE, LAPAROSCOPIC
Anesthesia: General | Laterality: Bilateral

## 2018-07-06 MED ORDER — LACTATED RINGERS IV SOLN: INTRAVENOUS | Status: DC

## 2018-07-13 ENCOUNTER — Encounter
Admission: RE | Disposition: A | Discharge status: Home / Self Care | Payer: Self-pay | Source: Home / Self Care | Attending: Obstetrics & Gynecology

## 2018-07-13 ENCOUNTER — Ambulatory Visit
Admission: RE | Admit: 2018-07-13 | Discharge: 2018-07-13 | Disposition: A | Discharge status: Home / Self Care | Payer: Medicaid Other | Attending: Obstetrics & Gynecology | Admitting: Obstetrics & Gynecology

## 2018-07-13 SURGERY — LIGATION, FALLOPIAN TUBE, LAPAROSCOPIC
Anesthesia: General | Laterality: Bilateral

## 2018-07-13 MED ORDER — LACTATED RINGERS IV SOLN: INTRAVENOUS | Status: DC

## 2018-07-13 SURGICAL SUPPLY — 36 items
BAG URINE DRAINAGE (UROLOGICAL SUPPLIES) ×3 IMPLANT
BLADE SURG SZ11 CARB STEEL (BLADE) ×3 IMPLANT
CATH FOLEY 2WAY  5CC 16FR (CATHETERS) ×2
CATH URTH 16FR FL 2W BLN LF (CATHETERS) ×1 IMPLANT
CHLORAPREP W/TINT 26ML (MISCELLANEOUS) ×3 IMPLANT
COVER WAND RF STERILE (DRAPES) ×3 IMPLANT
DERMABOND ADVANCED (GAUZE/BANDAGES/DRESSINGS) ×2
DERMABOND ADVANCED .7 DNX12 (GAUZE/BANDAGES/DRESSINGS) ×1 IMPLANT
DRAPE LEGGINS SURG 28X43 STRL (DRAPES) ×3 IMPLANT
DRAPE UNDER BUTTOCK W/FLU (DRAPES) ×3 IMPLANT
ELECT REM PT RETURN 9FT ADLT (ELECTROSURGICAL) ×3
ELECTRODE REM PT RTRN 9FT ADLT (ELECTROSURGICAL) ×1 IMPLANT
GLOVE PI ORTHOPRO 6.5 (GLOVE) ×2
GLOVE PI ORTHOPRO STRL 6.5 (GLOVE) ×1 IMPLANT
GLOVE SURG SYN 6.5 ES PF (GLOVE) ×3 IMPLANT
GOWN STRL REUS W/ TWL LRG LVL3 (GOWN DISPOSABLE) ×1 IMPLANT
GOWN STRL REUS W/TWL LRG LVL3 (GOWN DISPOSABLE) ×2
IRRIGATION STRYKERFLOW (MISCELLANEOUS) ×1 IMPLANT
IRRIGATOR STRYKERFLOW (MISCELLANEOUS) ×3
KIT PINK PAD W/HEAD ARE REST (MISCELLANEOUS) ×3
KIT PINK PAD W/HEAD ARM REST (MISCELLANEOUS) ×1 IMPLANT
KIT TURNOVER CYSTO (KITS) ×6 IMPLANT
LABEL OR SOLS (LABEL) ×3 IMPLANT
LIGASURE VESSEL 5MM BLUNT TIP (ELECTROSURGICAL) IMPLANT
NS IRRIG 500ML POUR BTL (IV SOLUTION) ×3 IMPLANT
PACK LAP CHOLECYSTECTOMY (MISCELLANEOUS) ×3 IMPLANT
PAD OB MATERNITY 4.3X12.25 (PERSONAL CARE ITEMS) ×3 IMPLANT
PAD PREP 24X41 OB/GYN DISP (PERSONAL CARE ITEMS) ×6 IMPLANT
SET TUBE SMOKE EVAC HIGH FLOW (TUBING) ×3 IMPLANT
SLEEVE ENDOPATH XCEL 5M (ENDOMECHANICALS) ×3 IMPLANT
SUT MNCRL 4-0 (SUTURE) ×2
SUT MNCRL 4-0 27XMFL (SUTURE) ×1
SUT VIC AB 2-0 UR6 27 (SUTURE) ×3 IMPLANT
SUTURE MNCRL 4-0 27XMF (SUTURE) ×1 IMPLANT
TROCAR 5M 150ML BLDLS (TROCAR) ×3 IMPLANT
TROCAR XCEL NON-BLD 5MMX100MML (ENDOMECHANICALS) ×3 IMPLANT

## 2018-08-03 ENCOUNTER — Ambulatory Visit: Payer: Medicaid Other | Admitting: Anesthesiology

## 2018-08-03 ENCOUNTER — Encounter
Admission: RE | Disposition: A | Discharge status: Home / Self Care | Payer: Self-pay | Source: Home / Self Care | Attending: Obstetrics & Gynecology

## 2018-08-03 ENCOUNTER — Ambulatory Visit
Admission: RE | Admit: 2018-08-03 | Discharge: 2018-08-03 | Disposition: A | Discharge status: Home / Self Care | Payer: Medicaid Other | Source: Home / Self Care | Attending: Obstetrics & Gynecology | Admitting: Obstetrics & Gynecology

## 2018-08-03 ENCOUNTER — Encounter: Payer: Self-pay | Admitting: *Deleted

## 2018-08-03 ENCOUNTER — Other Ambulatory Visit: Payer: Self-pay

## 2018-08-03 DIAGNOSIS — F17210 Nicotine dependence, cigarettes, uncomplicated: Secondary | ICD-10-CM | POA: Insufficient documentation

## 2018-08-03 DIAGNOSIS — K219 Gastro-esophageal reflux disease without esophagitis: Secondary | ICD-10-CM

## 2018-08-03 DIAGNOSIS — Z302 Encounter for sterilization: Secondary | ICD-10-CM

## 2018-08-03 HISTORY — PX: LAPAROSCOPIC TUBAL LIGATION: SHX1937

## 2018-08-03 LAB — POCT PREGNANCY, URINE: Preg Test, Ur: NEGATIVE

## 2018-08-03 LAB — BASIC METABOLIC PANEL
Anion gap: 7 (ref 5–15)
BUN: 8 mg/dL (ref 6–20)
CO2: 19 mmol/L — ABNORMAL LOW (ref 22–32)
Calcium: 9.1 mg/dL (ref 8.9–10.3)
Chloride: 112 mmol/L — ABNORMAL HIGH (ref 98–111)
Creatinine, Ser: 0.63 mg/dL (ref 0.44–1.00)
GFR calc Af Amer: >60 mL/min (ref >60)
GFR calc non Af Amer: >60 mL/min (ref >60)
Glucose, Bld: 89 mg/dL (ref 70–99)
POTASSIUM: 3.6 mmol/L (ref 3.5–5.1)
Sodium: 138 mmol/L (ref 135–145)

## 2018-08-03 LAB — TYPE AND SCREEN
ABO/RH(D): O POS
ANTIBODY SCREEN: NEGATIVE

## 2018-08-03 LAB — CBC
HCT: 41.4 % (ref 36.0–46.0)
HEMOGLOBIN: 14.3 g/dL (ref 12.0–15.0)
MCH: 32.8 pg (ref 26.0–34.0)
MCHC: 34.5 g/dL (ref 30.0–36.0)
MCV: 95 fL (ref 80.0–100.0)
NRBC: 0 % (ref 0.0–0.2)
Platelets: 307 10*3/uL (ref 150–400)
RBC: 4.36 MIL/uL (ref 3.87–5.11)
RDW: 12 % (ref 11.5–15.5)
WBC: 9.5 10*3/uL (ref 4.0–10.5)

## 2018-08-03 SURGERY — LIGATION, FALLOPIAN TUBE, LAPAROSCOPIC
Anesthesia: General | Site: Abdomen | Laterality: Bilateral

## 2018-08-03 MED ORDER — IBUPROFEN 600 MG PO TABS
ORAL_TABLET | ORAL | Status: AC
  Administered 2018-08-03: 600 mg via ORAL
  Filled 2018-08-03: qty 1

## 2018-08-03 MED ORDER — SILVER NITRATE-POT NITRATE 75-25 % EX MISC
CUTANEOUS | Status: DC | PRN
  Administered 2018-08-03: 1

## 2018-08-03 MED ORDER — ACETAMINOPHEN 10 MG/ML IV SOLN
INTRAVENOUS | Status: DC | PRN
  Administered 2018-08-03: 1000 mg via INTRAVENOUS

## 2018-08-03 MED ORDER — FENTANYL CITRATE (PF) 100 MCG/2ML IJ SOLN: INTRAMUSCULAR | Status: DC | PRN

## 2018-08-03 MED ORDER — FENTANYL CITRATE (PF) 100 MCG/2ML IJ SOLN
25.0000 ug | INTRAMUSCULAR | Status: DC | PRN
  Administered 2018-08-03: 25 ug via INTRAVENOUS

## 2018-08-03 MED ORDER — KETOROLAC TROMETHAMINE 30 MG/ML IJ SOLN
INTRAMUSCULAR | Status: DC | PRN
  Administered 2018-08-03: 30 mg via INTRAVENOUS

## 2018-08-03 MED ORDER — LACTATED RINGERS IV SOLN
INTRAVENOUS | Status: DC
  Administered 2018-08-03 (×2): via INTRAVENOUS

## 2018-08-03 MED ORDER — MIDAZOLAM HCL 2 MG/2ML IJ SOLN
INTRAMUSCULAR | Status: DC | PRN
  Administered 2018-08-03: 2 mg via INTRAVENOUS

## 2018-08-03 MED ORDER — FENTANYL CITRATE (PF) 100 MCG/2ML IJ SOLN
INTRAMUSCULAR | Status: AC
  Filled 2018-08-03: qty 2

## 2018-08-03 MED ORDER — PROPOFOL 10 MG/ML IV BOLUS
INTRAVENOUS | Status: DC | PRN
  Administered 2018-08-03: 150 mg via INTRAVENOUS

## 2018-08-03 MED ORDER — PROPOFOL 10 MG/ML IV BOLUS
INTRAVENOUS | Status: AC
  Filled 2018-08-03: qty 20

## 2018-08-03 MED ORDER — IBUPROFEN 600 MG PO TABS: 600.0000 mg | ORAL_TABLET | Freq: Four times a day (QID) | ORAL | 0 refills | Status: DC

## 2018-08-03 MED ORDER — OXYCODONE-ACETAMINOPHEN 7.5-325 MG PO TABS
1.0000 | ORAL_TABLET | Freq: Once | ORAL | Status: AC
  Administered 2018-08-03: 1 via ORAL

## 2018-08-03 MED ORDER — ACETAMINOPHEN 10 MG/ML IV SOLN
INTRAVENOUS | Status: AC
  Filled 2018-08-03: qty 100

## 2018-08-03 MED ORDER — MIDAZOLAM HCL 2 MG/2ML IJ SOLN
INTRAMUSCULAR | Status: AC
  Filled 2018-08-03: qty 2

## 2018-08-03 MED ORDER — SUGAMMADEX SODIUM 500 MG/5ML IV SOLN
INTRAVENOUS | Status: DC | PRN
  Administered 2018-08-03: 200 mg via INTRAVENOUS

## 2018-08-03 MED ORDER — ONDANSETRON HCL 4 MG/2ML IJ SOLN: 4.0000 mg | Freq: Once | INTRAMUSCULAR | Status: DC | PRN

## 2018-08-03 MED ORDER — FENTANYL CITRATE (PF) 100 MCG/2ML IJ SOLN
INTRAMUSCULAR | Status: DC | PRN
  Administered 2018-08-03 (×2): 50 ug via INTRAVENOUS

## 2018-08-03 MED ORDER — SUCCINYLCHOLINE CHLORIDE 20 MG/ML IJ SOLN
INTRAMUSCULAR | Status: DC | PRN
  Administered 2018-08-03: 100 mg via INTRAVENOUS

## 2018-08-03 MED ORDER — BUPIVACAINE HCL (PF) 0.5 % IJ SOLN
INTRAMUSCULAR | Status: AC
  Filled 2018-08-03: qty 30

## 2018-08-03 MED ORDER — OXYCODONE-ACETAMINOPHEN 7.5-325 MG PO TABS
ORAL_TABLET | ORAL | Status: AC
  Filled 2018-08-03: qty 1

## 2018-08-03 MED ORDER — ROCURONIUM BROMIDE 100 MG/10ML IV SOLN
INTRAVENOUS | Status: DC | PRN
  Administered 2018-08-03: 30 mg via INTRAVENOUS

## 2018-08-03 MED ORDER — ONDANSETRON HCL 4 MG/2ML IJ SOLN
INTRAMUSCULAR | Status: DC | PRN
  Administered 2018-08-03: 4 mg via INTRAVENOUS

## 2018-08-03 MED ORDER — DEXAMETHASONE SODIUM PHOSPHATE 10 MG/ML IJ SOLN
INTRAMUSCULAR | Status: DC | PRN
  Administered 2018-08-03: 4 mg via INTRAVENOUS

## 2018-08-03 MED ORDER — OXYCODONE HCL 5 MG PO TABS: 5.0000 mg | ORAL_TABLET | ORAL | 0 refills | Status: DC | PRN

## 2018-08-03 MED ORDER — IBUPROFEN 600 MG PO TABS
600.0000 mg | ORAL_TABLET | Freq: Once | ORAL | Status: AC
  Administered 2018-08-03: 600 mg via ORAL
  Filled 2018-08-03: qty 1

## 2018-08-03 MED ORDER — LIDOCAINE HCL (CARDIAC) PF 100 MG/5ML IV SOSY: PREFILLED_SYRINGE | INTRAVENOUS | Status: DC | PRN

## 2018-08-03 SURGICAL SUPPLY — 37 items
BAG URINE DRAINAGE (UROLOGICAL SUPPLIES) ×3 IMPLANT
BLADE SURG SZ11 CARB STEEL (BLADE) ×3 IMPLANT
CATH FOLEY 2WAY  5CC 16FR (CATHETERS) ×2
CATH URTH 16FR FL 2W BLN LF (CATHETERS) ×1 IMPLANT
CHLORAPREP W/TINT 26ML (MISCELLANEOUS) ×3 IMPLANT
COVER WAND RF STERILE (DRAPES) ×1 IMPLANT
DERMABOND ADVANCED (GAUZE/BANDAGES/DRESSINGS) ×2
DERMABOND ADVANCED .7 DNX12 (GAUZE/BANDAGES/DRESSINGS) ×1 IMPLANT
DRAPE LEGGINS SURG 28X43 STRL (DRAPES) ×3 IMPLANT
DRAPE UNDER BUTTOCK W/FLU (DRAPES) ×3 IMPLANT
ELECT REM PT RETURN 9FT ADLT (ELECTROSURGICAL) ×3
ELECTRODE REM PT RTRN 9FT ADLT (ELECTROSURGICAL) ×1 IMPLANT
GLOVE PI ORTHOPRO 6.5 (GLOVE) ×2
GLOVE PI ORTHOPRO STRL 6.5 (GLOVE) ×1 IMPLANT
GLOVE SURG SYN 6.5 ES PF (GLOVE) ×3 IMPLANT
GLOVE SURG SYN 6.5 PF PI (GLOVE) ×1 IMPLANT
GOWN STRL REUS W/ TWL LRG LVL3 (GOWN DISPOSABLE) ×1 IMPLANT
GOWN STRL REUS W/TWL LRG LVL3 (GOWN DISPOSABLE) ×2
IRRIGATION STRYKERFLOW (MISCELLANEOUS) ×1 IMPLANT
IRRIGATOR STRYKERFLOW (MISCELLANEOUS)
KIT PINK PAD W/HEAD ARE REST (MISCELLANEOUS) ×3
KIT PINK PAD W/HEAD ARM REST (MISCELLANEOUS) ×1 IMPLANT
KIT TURNOVER CYSTO (KITS) ×6 IMPLANT
LABEL OR SOLS (LABEL) ×3 IMPLANT
LIGASURE VESSEL 5MM BLUNT TIP (ELECTROSURGICAL) IMPLANT
NS IRRIG 500ML POUR BTL (IV SOLUTION) ×3 IMPLANT
PACK LAP CHOLECYSTECTOMY (MISCELLANEOUS) ×3 IMPLANT
PAD OB MATERNITY 4.3X12.25 (PERSONAL CARE ITEMS) ×3 IMPLANT
PAD PREP 24X41 OB/GYN DISP (PERSONAL CARE ITEMS) ×6 IMPLANT
SET TUBE SMOKE EVAC HIGH FLOW (TUBING) ×1 IMPLANT
SLEEVE ENDOPATH XCEL 5M (ENDOMECHANICALS) ×3 IMPLANT
SUT MNCRL 4-0 (SUTURE) ×2
SUT MNCRL 4-0 27XMFL (SUTURE) ×1
SUT VIC AB 2-0 UR6 27 (SUTURE) ×3 IMPLANT
SUTURE MNCRL 4-0 27XMF (SUTURE) ×1 IMPLANT
TROCAR 5M 150ML BLDLS (TROCAR) ×1 IMPLANT
TROCAR XCEL NON-BLD 5MMX100MML (ENDOMECHANICALS) ×3 IMPLANT

## 2018-08-03 NOTE — Anesthesia Procedure Notes (Signed)
Procedure Name: Intubation Date/Time: 08/03/2018 2:01 PM Performed by: Justus Memory, CRNA Pre-anesthesia Checklist: Patient identified, Patient being monitored, Timeout performed, Emergency Drugs available and Suction available Patient Re-evaluated:Patient Re-evaluated prior to induction Oxygen Delivery Method: Circle system utilized Preoxygenation: Pre-oxygenation with 100% oxygen Induction Type: IV induction Ventilation: Mask ventilation without difficulty Laryngoscope Size: Mac and 3 Grade View: Grade I Tube type: Oral Tube size: 7.0 mm Number of attempts: 1 Airway Equipment and Method: Stylet Placement Confirmation: ETT inserted through vocal cords under direct vision,  positive ETCO2 and breath sounds checked- equal and bilateral Secured at: 21 cm Tube secured with: Tape Dental Injury: Teeth and Oropharynx as per pre-operative assessment

## 2018-08-03 NOTE — Anesthesia Post-op Follow-up Note (Signed)
Anesthesia QCDR form completed.        

## 2018-08-03 NOTE — Discharge Instructions (Signed)
Discharge instructions:  Call office if you have any of the following: fever >101 F, chills, shortness of breath, excessive vaginal bleeding, incision drainage or problems, leg pain or redness, or any other concerns.   No driving on narcotics, or until you are sure you can slam on the brakes.   You may feel some pain in your upper right abdomen/rib and right shoulder.  This is from the gas in the abdomen for surgery. This will subside over time, please be patient!  Take 600mg  Ibuprofen and 1000mg  Tylenol around the clock, every 6 hours for at least the first 3-5 days.  After this you can take as needed.  This will help decrease inflammation and promote healing.  The narcotics you'll take just as needed, as they just trick your brain into thinking its not in pain.    Please don't limit yourself in terms of routine activity.  You will be able to do most things, although they may take longer to do or be a little painful.  You can do it!  Don't be a hero, but don't be a wimp either!    Laparoscopic Tubal Ligation, Care After Refer to this sheet in the next few weeks. These instructions provide you with information about caring for yourself after your procedure. Your health care provider may also give you more specific instructions. Your treatment has been planned according to current medical practices, but problems sometimes occur. Call your health care provider if you have any problems or questions after your procedure. What can I expect after the procedure? After the procedure, it is common to have:  A sore throat.  Discomfort in your shoulder.  Mild discomfort or cramping in your abdomen.  Gas pains.  Pain or soreness in the area where the surgical cut (incision) was made.  A bloated feeling.  Tiredness.  Nausea.  Vomiting. Follow these instructions at home: Medicines  Take over-the-counter and prescription medicines only as told by your health care provider.  Do not take  aspirin because it can cause bleeding.  Do not drive or operate heavy machinery while taking prescription pain medicine. Activity  Rest for the rest of the day.  Return to your normal activities as told by your health care provider. Ask your health care provider what activities are safe for you. Incision care      Follow instructions from your health care provider about how to take care of your incision. Make sure you: ? Wash your hands with soap and water before you change your bandage (dressing). If soap and water are not available, use hand sanitizer. ? Change your dressing as told by your health care provider. ? Leave stitches (sutures) in place. They may need to stay in place for 2 weeks or longer.  Check your incision area every day for signs of infection. Check for: ? More redness, swelling, or pain. ? More fluid or blood. ? Warmth. ? Pus or a bad smell. Other Instructions  Do not take baths, swim, or use a hot tub until your health care provider approves. You may take showers.  Keep all follow-up visits as told by your health care provider. This is important.  Have someone help you with your daily household tasks for the first few days. Contact a health care provider if:  You have more redness, swelling, or pain around your incision.  Your incision feels warm to the touch.  You have pus or a bad smell coming from your incision.  The edges  of your incision break open after the sutures have been removed.  Your pain does not improve after 2-3 days.  You have a rash.  You repeatedly become dizzy or light-headed.  Your pain medicine is not helping.  You are constipated. Get help right away if:  You have a fever.  You faint.  You have increasing pain in your abdomen.  You have severe pain in one or both of your shoulders.  You have fluid or blood coming from your sutures or from your vagina.  You have shortness of breath or difficulty breathing.  You  have chest pain or leg pain.  You have ongoing nausea, vomiting, or diarrhea. This information is not intended to replace advice given to you by your health care provider. Make sure you discuss any questions you have with your health care provider. Document Released: 12/14/2004 Document Revised: 01/21/2017 Document Reviewed: 05/07/2015 Elsevier Interactive Patient Education  2019 [place]   1) The drugs that you were given will stay in your system until tomorrow so for the next 24 hours you should not:  A) Drive an automobile B) Make any legal decisions C) Drink any alcoholic beverage   2) You may resume regular meals tomorrow.  Today it is better to start with liquids and gradually work up to solid foods.  You may eat anything you prefer, but it is better to start with liquids, then soup and crackers, and gradually work up to solid foods.   3) Please notify your doctor immediately if you have any unusual bleeding, trouble breathing, redness and pain at the surgery site, drainage, fever, or pain not relieved by medication.    4) Additional Instructions:        Please contact your physician with any problems or Same Day Surgery at [phone], Monday through Friday 6 am to 4 pm, or [place] at [facility] number at [phone].

## 2018-08-03 NOTE — Anesthesia Postprocedure Evaluation (Signed)
Anesthesia Post Note  Patient: Megan Bennett  Procedure(s) Performed: LAPAROSCOPIC TUBAL LIGATION (Bilateral Abdomen)  Patient location during evaluation: PACU Anesthesia Type: General Level of consciousness: awake and alert Pain management: pain level controlled Vital Signs Assessment: post-procedure vital signs reviewed and stable Respiratory status: spontaneous breathing, nonlabored ventilation, respiratory function stable and patient connected to nasal cannula oxygen Cardiovascular status: blood pressure returned to baseline and stable Postop Assessment: no apparent nausea or vomiting Anesthetic complications: no     Last Vitals:  Vitals:   08/03/18 1645 08/03/18 1722  BP: 126/70 (!) 141/71  Pulse: (!) 47   Resp: 18 19  Temp: (!) 36.4 C   SpO2: 99% 96%    Last Pain:  Vitals:   08/03/18 1722  TempSrc:   PainSc: [place]

## 2018-08-03 NOTE — Transfer of Care (Signed)
Immediate Anesthesia Transfer of Care Note  Patient: Megan Bennett  Procedure(s) Performed: LAPAROSCOPIC TUBAL LIGATION (Bilateral Abdomen)  Patient Location: PACU  Anesthesia Type:General  Level of Consciousness: sedated  Airway & Oxygen Therapy: Patient Spontanous Breathing and Patient connected to face mask oxygen  Post-op Assessment: Report given to RN and Post -op Vital signs reviewed and stable  Post vital signs: Reviewed and stable  Last Vitals:  Vitals Value Taken Time  BP 126/74 08/03/2018  3:04 PM  Temp    Pulse 65 08/03/2018  3:06 PM  Resp 15 08/03/2018  3:06 PM  SpO2 97 % 08/03/2018  3:06 PM  Vitals shown include unvalidated device data.  Last Pain:  Vitals:   08/03/18 1152  TempSrc: Tympanic  PainSc: 0-No pain         Complications: No apparent anesthesia complications

## 2018-08-03 NOTE — Anesthesia Preprocedure Evaluation (Addendum)
Anesthesia Evaluation  Patient identified by MRN, date of birth, ID band Patient awake    Reviewed: Allergy & Precautions, NPO status , Patient's Chart, lab work & pertinent test results, reviewed documented beta blocker date and time   Airway Mallampati: III  TM Distance: >3 FB     Dental  (+) Chipped   Pulmonary Current Smoker,           Cardiovascular      Neuro/Psych  Headaches, PSYCHIATRIC DISORDERS Anxiety  Neuromuscular disease    GI/Hepatic GERD  Controlled,  Endo/Other    Renal/GU      Musculoskeletal  (+) Arthritis , Fibromyalgia -  Abdominal   Peds  Hematology   Anesthesia Other Findings Obese. ADD.  Reproductive/Obstetrics                            Anesthesia Physical Anesthesia Plan  ASA: III  Anesthesia Plan: General   Post-op Pain Management:    Induction: Intravenous  PONV Risk Score and Plan:   Airway Management Planned: Oral ETT  Additional Equipment:   Intra-op Plan:   Post-operative Plan:   Informed Consent: I have reviewed the patients History and Physical, chart, labs and discussed the procedure including the risks, benefits and alternatives for the proposed anesthesia with the patient or authorized representative who has indicated his/her understanding and acceptance.       Plan Discussed with: CRNA  Anesthesia Plan Comments:         Anesthesia Quick Evaluation

## 2018-08-03 NOTE — H&P (Signed)
Preoperative History and Physical  Megan Bennett is a 33 y.o. G1P1001 here for surgical management of sterilization.   No significant preoperative concerns.  Proposed surgery: laparoscopic bilateral tubal ligation  Past Medical History:  Diagnosis Date  . ADD (attention deficit disorder)   . Arthritis   . Fibromyalgia   . GERD (gastroesophageal reflux disease)   . Headache    MIGRAINES   Past Surgical History:  Procedure Laterality Date  . ESOPHAGOGASTRODUODENOSCOPY    . WISDOM TOOTH EXTRACTION Bilateral    OB History  Gravida Para Term Preterm AB Living  1 1 1     1   SAB TAB Ectopic Multiple Live Births        0 1    # Outcome Date GA Lbr Len/2nd Weight Sex Delivery Anes PTL Lv  1 Term 06/08/18 [redacted]w[redacted]d 07:45 / 02:29 3340 g F Vag-Spont EPI  LIV  Patient denies any other pertinent gynecologic issues.   No current facility-administered medications on file prior to encounter.    Current Outpatient Medications on File Prior to Encounter  Medication Sig Dispense Refill  . ibuprofen (ADVIL,MOTRIN) 200 MG tablet Take 400-600 mg by mouth every 6 (six) hours as needed for moderate pain.    . Prenatal Vit-Fe Fumarate-FA (PREPLUS) 27-1 MG TABS Take 1 tablet by mouth daily.    Marland Kitchen acetaminophen (TYLENOL) 500 MG tablet Take 2 tablets (1,000 mg total) by mouth every 6 (six) hours as needed (for pain scale < 4). (Patient not taking: Reported on 06/24/2018) 30 tablet 0  . docusate sodium (COLACE) 100 MG capsule Take 1 capsule (100 mg total) by mouth 2 (two) times daily. (Patient not taking: Reported on 07/27/2018) 10 capsule 0  . ibuprofen (ADVIL,MOTRIN) 600 MG tablet Take 1 tablet (600 mg total) by mouth every 6 (six) hours. (Patient not taking: Reported on 07/27/2018) 30 tablet 0  . omeprazole (PRILOSEC) 20 MG capsule Take 20 mg by mouth daily before breakfast.     Allergies  Allergen Reactions  . Amoxicillin Rash    Did it involve swelling of the face/tongue/throat, SOB, or low BP?  No Did it involve sudden or severe rash/hives, skin peeling, or any reaction on the inside of your mouth or nose? No Did you need to seek medical attention at a hospital or doctor's office? No When did it last happen?14 + years If all above answers are "NO", may proceed with cephalosporin use.,   . Wellbutrin [Bupropion] Other (See Comments)    shakey    Social History:   reports that she has been smoking cigarettes. She has a 5.00 pack-year smoking history. She has never used smokeless tobacco. She reports current alcohol use of about 1.0 standard drinks of alcohol per week. She reports that she does not use drugs.  History reviewed. No pertinent family history.  Review of Systems: Noncontributory  PHYSICAL EXAM: Blood pressure 111/68, pulse 83, temperature 97.7 F (36.5 C), temperature source Tympanic, resp. rate 18, height 5\' 7"  (1.702 m), weight 103.6 kg, SpO2 99 %, currently breastfeeding. General appearance - alert, well appearing, and in no distress Chest - clear to auscultation, no wheezes, rales or rhonchi, symmetric air entry Heart - normal rate and regular rhythm Abdomen - soft, nontender, nondistended, no masses or organomegaly Pelvic - examination not indicated Extremities - peripheral pulses normal, no pedal edema, no clubbing or cyanosis  Labs: Results for orders placed or performed during the hospital encounter of 08/03/18 (from the past 336 hour(s))  Pregnancy,  urine POC   Collection Time: 08/03/18 11:48 AM  Result Value Ref Range   Preg Test, Ur NEGATIVE NEGATIVE  CBC   Collection Time: 08/03/18 11:49 AM  Result Value Ref Range   WBC 9.5 4.0 - 10.5 K/uL   RBC 4.36 3.87 - 5.11 MIL/uL   Hemoglobin 14.3 12.0 - 15.0 g/dL   HCT 41.4 36.0 - 46.0 %   MCV 95.0 80.0 - 100.0 fL   MCH 32.8 26.0 - 34.0 pg   MCHC 34.5 30.0 - 36.0 g/dL   RDW 12.0 11.5 - 15.5 %   Platelets 307 150 - 400 K/uL   nRBC 0.0 0.0 - 0.2 %  Basic metabolic panel   Collection Time:  08/03/18 11:49 AM  Result Value Ref Range   Sodium 138 135 - 145 mmol/L   Potassium 3.6 3.5 - 5.1 mmol/L   Chloride 112 (H) 98 - 111 mmol/L   CO2 19 (L) 22 - 32 mmol/L   Glucose, Bld 89 70 - 99 mg/dL   BUN 8 6 - 20 mg/dL   Creatinine, Ser 0.63 0.44 - 1.00 mg/dL   Calcium 9.1 8.9 - 10.3 mg/dL   GFR calc non Af Amer >60 >60 mL/min   GFR calc Af Amer >60 >60 mL/min   Anion gap 7 5 - 15  Type and screen [place]   Collection Time: 08/03/18 11:49 AM  Result Value Ref Range   ABO/RH(D) O POS    Antibody Screen NEG    Sample Expiration      08/06/2018 Performed at [facility], [address], [place], [place] [address]     Imaging Studies: No results found.  Assessment: Encounter for permanent sterilization  Plan: Patient will undergo surgical management with laparoscopic bilateral tubal ligation   The risks of surgery were discussed in detail with the patient including but not limited to: bleeding which may require transfusion or reoperation; infection which may require antibiotics; injury to surrounding organs which may involve bowel, bladder, ureters ; need for additional procedures including laparoscopy or laparotomy; thromboembolic phenomenon, surgical site problems and other postoperative/anesthesia complications. Likelihood of success in alleviating the patient's condition was discussed. Routine postoperative instructions will be reviewed with the patient and her family in detail after surgery.  The patient concurred with the proposed plan, giving informed written consent for the surgery.  Patient has been NPO since last night she will remain NPO for procedure.  Anesthesia and OR aware.  Preoperative prophylactic antibiotics and SCDs ordered on call to the OR.  To OR when ready.  ----- Larey Days, MD Attending Obstetrician and Gynecologist [facility], Department of [facility]  08/03/2018 1:25 PM

## 2018-08-03 NOTE — Op Note (Signed)
08/03/2018  PATIENT:  Megan Bennett  33 y.o. female  PRE-OPERATIVE DIAGNOSIS:  desired permanent sterilization  POST-OPERATIVE DIAGNOSIS:  desired permanent sterilization  PROCEDURE:  Procedure(s): LAPAROSCOPIC TUBAL LIGATION (Bilateral)  SURGEON:  Surgeon(s) and Role:    * Annamaria Salah, Honor Loh, MD - Primary  ANESTHESIA: GET  EBL:  Total I/O In: 1280 [P.O.:280; I.V.:1000] Out: 605 [Urine:600; Blood:5]  DRAINS: foley to gravity (removed postop)  SPECIMEN: portion of left tube and portion of right tube  FINDINGS: normal appearing pelvic organs, sigmoid bowel adherent to left pelvic side wall.  DISPOSITION OF SPECIMEN:  To pathology  COUNTS: correct x2  COMPLICATIONS: none apparent  PATIENT DISPOSITION:  VS stable to PACU   Indication for surgery: Patient had presented with request for permanent sterilization.  Long term hormonal and non-hormonal options were discussed, and patient requested surgical intervention.  Discussion of surgical options were also reviewed, including clips, fulguration, and partial salpingectomy, as well as their limitations of possible failure, ectopic pregnancy, retention of fimbriated ends which could become cancerous, and post-tubal pain syndrome. She understands that reversal would not be possible after this surgery, and requests to proceed.   Procedure: The patient was brought to the OR and identified as Megan Bennett.  She was given general anesthesia via endotracheal route, positioned in the dorsal lithotomy position and prepped and draped in the usual sterile fashion.  A surgical time-out was called. A foley catheter was placed.  A speculum was placed in the vagina and the cervix was visualized, grasped with a single tooth tenaculum and a uterine manipulator was placed in the uterus.  After a change of gloves, the attention was turned to the abdomen. A periumbilical incision was made, and using the visiport method, a 60mm trochar was  inserted. The opening pressure was 43mmHg. Pneumoperitoneum was created to 27mmHg.  Brief survey of the abdomen was performed and appeared normal.  A 56mm trochar was placed suprapubic area under visualization.    The Ligasure was inserted and used to seal and divide the mesosalpinx from the fimbriated end to near the cornua of both the left and right fallopian tubes.  The tubes were each grasped and retracted from the abdomen.  The pneumoperitoneum was deflated then recreated and all operative sites were hemostatic.    The pneumoperitoneum was deflated and trochars removed.  The skin incisions were reapproximated with 4-0 monocryl.  The skin was then closed with sugical glue.    The patient tolerated the procedure, the sponge, needle, and instrument counts were correct x2, and the patient was brought to PACU extubated, and in a stable condition.  I was present for, and performed this procedure in its entirety.  ----- Larey Days, MD Attending Obstetrician and Gynecologist [facility], Department of [facility]

## 2018-08-04 ENCOUNTER — Emergency Department: Payer: Medicaid Other

## 2018-08-04 ENCOUNTER — Encounter
Admission: EM | Disposition: A | Discharge status: Home / Self Care | Payer: Self-pay | Source: Home / Self Care | Attending: Obstetrics & Gynecology

## 2018-08-04 ENCOUNTER — Encounter: Payer: Self-pay | Admitting: Emergency Medicine

## 2018-08-04 ENCOUNTER — Other Ambulatory Visit: Payer: Self-pay

## 2018-08-04 ENCOUNTER — Inpatient Hospital Stay
Admission: EM | Admit: 2018-08-04 | Discharge: 2018-08-12 | DRG: 907 | Disposition: A | Discharge status: Home / Self Care | Payer: Medicaid Other | Attending: Obstetrics & Gynecology | Admitting: Obstetrics & Gynecology

## 2018-08-04 ENCOUNTER — Observation Stay: Payer: Medicaid Other | Admitting: Certified Registered"

## 2018-08-04 DIAGNOSIS — K567 Ileus, unspecified: Secondary | ICD-10-CM | POA: Diagnosis present

## 2018-08-04 DIAGNOSIS — E669 Obesity, unspecified: Secondary | ICD-10-CM | POA: Diagnosis present

## 2018-08-04 DIAGNOSIS — R109 Unspecified abdominal pain: Secondary | ICD-10-CM

## 2018-08-04 DIAGNOSIS — K631 Perforation of intestine (nontraumatic): Secondary | ICD-10-CM | POA: Diagnosis present

## 2018-08-04 DIAGNOSIS — R6510 Systemic inflammatory response syndrome (SIRS) of non-infectious origin without acute organ dysfunction: Secondary | ICD-10-CM | POA: Diagnosis present

## 2018-08-04 DIAGNOSIS — Z6833 Body mass index (BMI) 33.0-33.9, adult: Secondary | ICD-10-CM

## 2018-08-04 DIAGNOSIS — K219 Gastro-esophageal reflux disease without esophagitis: Secondary | ICD-10-CM | POA: Diagnosis present

## 2018-08-04 DIAGNOSIS — R0609 Other forms of dyspnea: Secondary | ICD-10-CM | POA: Diagnosis not present

## 2018-08-04 DIAGNOSIS — E876 Hypokalemia: Secondary | ICD-10-CM | POA: Diagnosis not present

## 2018-08-04 DIAGNOSIS — R001 Bradycardia, unspecified: Secondary | ICD-10-CM | POA: Diagnosis not present

## 2018-08-04 DIAGNOSIS — I10 Essential (primary) hypertension: Secondary | ICD-10-CM | POA: Diagnosis present

## 2018-08-04 DIAGNOSIS — Z5331 Laparoscopic surgical procedure converted to open procedure: Secondary | ICD-10-CM

## 2018-08-04 DIAGNOSIS — A0472 Enterocolitis due to Clostridium difficile, not specified as recurrent: Secondary | ICD-10-CM | POA: Diagnosis present

## 2018-08-04 DIAGNOSIS — M797 Fibromyalgia: Secondary | ICD-10-CM | POA: Diagnosis present

## 2018-08-04 DIAGNOSIS — F17210 Nicotine dependence, cigarettes, uncomplicated: Secondary | ICD-10-CM | POA: Diagnosis present

## 2018-08-04 DIAGNOSIS — K9171 Accidental puncture and laceration of a digestive system organ or structure during a digestive system procedure: Principal | ICD-10-CM | POA: Diagnosis present

## 2018-08-04 DIAGNOSIS — R0602 Shortness of breath: Secondary | ICD-10-CM

## 2018-08-04 DIAGNOSIS — R05 Cough: Secondary | ICD-10-CM | POA: Diagnosis not present

## 2018-08-04 DIAGNOSIS — Z302 Encounter for sterilization: Secondary | ICD-10-CM | POA: Diagnosis not present

## 2018-08-04 DIAGNOSIS — IMO0002 Reserved for concepts with insufficient information to code with codable children: Secondary | ICD-10-CM | POA: Diagnosis present

## 2018-08-04 DIAGNOSIS — K9172 Accidental puncture and laceration of a digestive system organ or structure during other procedure: Secondary | ICD-10-CM | POA: Diagnosis present

## 2018-08-04 DIAGNOSIS — T819XXA Unspecified complication of procedure, initial encounter: Secondary | ICD-10-CM | POA: Diagnosis present

## 2018-08-04 DIAGNOSIS — Z9889 Other specified postprocedural states: Secondary | ICD-10-CM

## 2018-08-04 HISTORY — PX: LAPAROSCOPY: SHX197

## 2018-08-04 HISTORY — PX: SMALL BOWEL REPAIR: SHX6447

## 2018-08-04 LAB — CBC
HCT: 47.7 % — ABNORMAL HIGH (ref 36.0–46.0)
Hemoglobin: 16.2 g/dL — ABNORMAL HIGH (ref 12.0–15.0)
MCH: 33.1 pg (ref 26.0–34.0)
MCHC: 34 g/dL (ref 30.0–36.0)
MCV: 97.5 fL (ref 80.0–100.0)
Platelets: 330 10*3/uL (ref 150–400)
RBC: 4.89 MIL/uL (ref 3.87–5.11)
RDW: 12.3 % (ref 11.5–15.5)
WBC: 17.9 10*3/uL — ABNORMAL HIGH (ref 4.0–10.5)
nRBC: 0 % (ref 0.0–0.2)

## 2018-08-04 LAB — COMPREHENSIVE METABOLIC PANEL
ALT: 34 U/L (ref 0–44)
AST: 30 U/L (ref 15–41)
Albumin: 3.6 g/dL (ref 3.5–5.0)
Alkaline Phosphatase: 72 U/L (ref 38–126)
Anion gap: 12 (ref 5–15)
BUN: 13 mg/dL (ref 6–20)
CHLORIDE: 104 mmol/L (ref 98–111)
CO2: 18 mmol/L — ABNORMAL LOW (ref 22–32)
Calcium: 8.5 mg/dL — ABNORMAL LOW (ref 8.9–10.3)
Creatinine, Ser: 0.87 mg/dL (ref 0.44–1.00)
GFR calc Af Amer: >60 mL/min (ref >60)
Glucose, Bld: 330 mg/dL — ABNORMAL HIGH (ref 70–99)
Potassium: 3.4 mmol/L — ABNORMAL LOW (ref 3.5–5.1)
Sodium: 134 mmol/L — ABNORMAL LOW (ref 135–145)
Total Bilirubin: 1.2 mg/dL (ref 0.3–1.2)
Total Protein: 6.7 g/dL (ref 6.5–8.1)

## 2018-08-04 LAB — LIPASE, BLOOD: Lipase: 19 U/L (ref 11–51)

## 2018-08-04 SURGERY — LAPAROSCOPY, DIAGNOSTIC
Anesthesia: General | Site: Abdomen

## 2018-08-04 MED ORDER — SUGAMMADEX SODIUM 500 MG/5ML IV SOLN
INTRAVENOUS | Status: DC | PRN
  Administered 2018-08-04: 200 mg via INTRAVENOUS

## 2018-08-04 MED ORDER — HYDROMORPHONE HCL 1 MG/ML IJ SOLN
INTRAMUSCULAR | Status: DC | PRN
  Administered 2018-08-04 (×2): 0.5 mg via INTRAVENOUS

## 2018-08-04 MED ORDER — HYDROMORPHONE HCL 1 MG/ML IJ SOLN
INTRAMUSCULAR | Status: AC
  Filled 2018-08-04: qty 1

## 2018-08-04 MED ORDER — KETOROLAC TROMETHAMINE 15 MG/ML IJ SOLN
15.0000 mg | Freq: Four times a day (QID) | INTRAMUSCULAR | Status: DC
  Administered 2018-08-05 – 2018-08-06 (×8): 15 mg via INTRAVENOUS
  Filled 2018-08-04 (×10): qty 1

## 2018-08-04 MED ORDER — MORPHINE SULFATE (PF) 4 MG/ML IV SOLN: 1.0000 mg | INTRAVENOUS | Status: DC | PRN

## 2018-08-04 MED ORDER — SODIUM CHLORIDE 0.9 % IV SOLN
INTRAVENOUS | Status: DC
  Administered 2018-08-04 – 2018-08-09 (×5): via INTRAVENOUS

## 2018-08-04 MED ORDER — ACETAMINOPHEN 650 MG RE SUPP
650.0000 mg | RECTAL | Status: DC | PRN
  Filled 2018-08-04: qty 1

## 2018-08-04 MED ORDER — ONDANSETRON HCL 4 MG/2ML IJ SOLN
4.0000 mg | Freq: Four times a day (QID) | INTRAMUSCULAR | Status: DC | PRN
  Administered 2018-08-07 – 2018-08-08 (×5): 4 mg via INTRAVENOUS
  Filled 2018-08-04 (×5): qty 2

## 2018-08-04 MED ORDER — BUPIVACAINE LIPOSOME 1.3 % IJ SUSP
INTRAMUSCULAR | Status: DC | PRN
  Administered 2018-08-04: 50 mL

## 2018-08-04 MED ORDER — BUPIVACAINE HCL (PF) 0.5 % IJ SOLN
INTRAMUSCULAR | Status: AC
  Filled 2018-08-04: qty 30

## 2018-08-04 MED ORDER — MORPHINE SULFATE (PF) 4 MG/ML IV SOLN: 4.0000 mg | Freq: Once | INTRAVENOUS | Status: DC

## 2018-08-04 MED ORDER — ESMOLOL HCL 100 MG/10ML IV SOLN
INTRAVENOUS | Status: DC | PRN
  Administered 2018-08-04: 20 mg via INTRAVENOUS
  Administered 2018-08-04: 10 mg via INTRAVENOUS

## 2018-08-04 MED ORDER — KETOROLAC TROMETHAMINE 15 MG/ML IJ SOLN
15.0000 mg | Freq: Four times a day (QID) | INTRAMUSCULAR | Status: DC
  Filled 2018-08-04: qty 1

## 2018-08-04 MED ORDER — ACETAMINOPHEN 10 MG/ML IV SOLN
INTRAVENOUS | Status: DC | PRN
  Administered 2018-08-04: 1000 mg via INTRAVENOUS

## 2018-08-04 MED ORDER — SODIUM CHLORIDE 0.9 % IV SOLN
1.0000 g | Freq: Once | INTRAVENOUS | Status: AC
  Administered 2018-08-04: 1 g via INTRAVENOUS
  Filled 2018-08-04: qty 10

## 2018-08-04 MED ORDER — SODIUM CHLORIDE 0.9 % IV BOLUS
1000.0000 mL | Freq: Once | INTRAVENOUS | Status: AC
  Administered 2018-08-04: 1000 mL via INTRAVENOUS

## 2018-08-04 MED ORDER — FENTANYL CITRATE (PF) 100 MCG/2ML IJ SOLN
INTRAMUSCULAR | Status: DC | PRN
  Administered 2018-08-04 (×4): 50 ug via INTRAVENOUS

## 2018-08-04 MED ORDER — GLYCOPYRROLATE 0.2 MG/ML IJ SOLN
INTRAMUSCULAR | Status: DC | PRN
  Administered 2018-08-04: 0.2 mg via INTRAVENOUS

## 2018-08-04 MED ORDER — SODIUM CHLORIDE 0.9 % IV SOLN
INTRAVENOUS | Status: DC | PRN
  Administered 2018-08-04: 15:00:00 via INTRAVENOUS

## 2018-08-04 MED ORDER — KETOROLAC TROMETHAMINE 30 MG/ML IJ SOLN
INTRAMUSCULAR | Status: AC
  Administered 2018-08-04: 15 mg via INTRAVENOUS
  Filled 2018-08-04: qty 1

## 2018-08-04 MED ORDER — MIDAZOLAM HCL 2 MG/2ML IJ SOLN
INTRAMUSCULAR | Status: AC
  Filled 2018-08-04: qty 2

## 2018-08-04 MED ORDER — MIDAZOLAM HCL 2 MG/2ML IJ SOLN
INTRAMUSCULAR | Status: DC | PRN
  Administered 2018-08-04: 1 mg via INTRAVENOUS

## 2018-08-04 MED ORDER — ENOXAPARIN SODIUM 40 MG/0.4ML ~~LOC~~ SOLN
40.0000 mg | SUBCUTANEOUS | Status: DC
  Administered 2018-08-05 – 2018-08-11 (×7): 40 mg via SUBCUTANEOUS
  Filled 2018-08-04 (×8): qty 0.4

## 2018-08-04 MED ORDER — SODIUM CHLORIDE 0.9 % IV SOLN
2.0000 g | INTRAVENOUS | Status: DC
  Administered 2018-08-05 – 2018-08-08 (×4): 2 g via INTRAVENOUS
  Filled 2018-08-04: qty 2
  Filled 2018-08-04 (×3): qty 20
  Filled 2018-08-04 (×2): qty 2

## 2018-08-04 MED ORDER — METOPROLOL TARTRATE 5 MG/5ML IV SOLN
INTRAVENOUS | Status: DC | PRN
  Administered 2018-08-04: 1 mg via INTRAVENOUS
  Administered 2018-08-04: 2 mg via INTRAVENOUS

## 2018-08-04 MED ORDER — METRONIDAZOLE IN NACL 5-0.79 MG/ML-% IV SOLN
500.0000 mg | Freq: Three times a day (TID) | INTRAVENOUS | Status: DC
  Administered 2018-08-04 – 2018-08-09 (×14): 500 mg via INTRAVENOUS
  Filled 2018-08-04 (×15): qty 100

## 2018-08-04 MED ORDER — ACETAMINOPHEN 325 MG PO TABS: 650.0000 mg | ORAL_TABLET | ORAL | Status: DC | PRN

## 2018-08-04 MED ORDER — FENTANYL CITRATE (PF) 250 MCG/5ML IJ SOLN
INTRAMUSCULAR | Status: AC
  Filled 2018-08-04: qty 5

## 2018-08-04 MED ORDER — PROPOFOL 10 MG/ML IV BOLUS
INTRAVENOUS | Status: AC
  Filled 2018-08-04: qty 20

## 2018-08-04 MED ORDER — LIDOCAINE HCL (CARDIAC) PF 100 MG/5ML IV SOSY
PREFILLED_SYRINGE | INTRAVENOUS | Status: DC | PRN
  Administered 2018-08-04: 100 mg via INTRAVENOUS

## 2018-08-04 MED ORDER — FENTANYL CITRATE (PF) 100 MCG/2ML IJ SOLN: 25.0000 ug | INTRAMUSCULAR | Status: DC | PRN

## 2018-08-04 MED ORDER — LACTATED RINGERS IV SOLN
INTRAVENOUS | Status: DC
  Administered 2018-08-04 – 2018-08-06 (×3): via INTRAVENOUS

## 2018-08-04 MED ORDER — SUCCINYLCHOLINE CHLORIDE 20 MG/ML IJ SOLN
INTRAMUSCULAR | Status: DC | PRN
  Administered 2018-08-04: 120 mg via INTRAVENOUS

## 2018-08-04 MED ORDER — MORPHINE SULFATE (PF) 4 MG/ML IV SOLN
4.0000 mg | INTRAVENOUS | Status: DC | PRN
  Administered 2018-08-04: 4 mg via INTRAVENOUS
  Filled 2018-08-04: qty 1

## 2018-08-04 MED ORDER — ONDANSETRON HCL 4 MG/2ML IJ SOLN
INTRAMUSCULAR | Status: DC | PRN
  Administered 2018-08-04: 4 mg via INTRAVENOUS

## 2018-08-04 MED ORDER — ONDANSETRON HCL 4 MG/2ML IJ SOLN
4.0000 mg | Freq: Once | INTRAMUSCULAR | Status: AC
  Administered 2018-08-04: 4 mg via INTRAVENOUS
  Filled 2018-08-04: qty 2

## 2018-08-04 MED ORDER — DEXMEDETOMIDINE HCL 200 MCG/2ML IV SOLN
INTRAVENOUS | Status: DC | PRN
  Administered 2018-08-04: 8 ug via INTRAVENOUS

## 2018-08-04 MED ORDER — PROPOFOL 10 MG/ML IV BOLUS
INTRAVENOUS | Status: DC | PRN
  Administered 2018-08-04: 200 mg via INTRAVENOUS

## 2018-08-04 MED ORDER — KETOROLAC TROMETHAMINE 30 MG/ML IJ SOLN
30.0000 mg | Freq: Four times a day (QID) | INTRAMUSCULAR | Status: DC
  Administered 2018-08-04: 15 mg via INTRAVENOUS
  Filled 2018-08-04: qty 1

## 2018-08-04 MED ORDER — SODIUM CHLORIDE 0.9 % IV SOLN
INTRAVENOUS | Status: DC | PRN
  Administered 2018-08-04: 30 ug/min via INTRAVENOUS

## 2018-08-04 MED ORDER — SODIUM CHLORIDE 0.9% FLUSH: 3.0000 mL | Freq: Once | INTRAVENOUS | Status: DC

## 2018-08-04 MED ORDER — PHENYLEPHRINE HCL 10 MG/ML IJ SOLN
INTRAMUSCULAR | Status: DC | PRN
  Administered 2018-08-04 (×4): 100 ug via INTRAVENOUS
  Administered 2018-08-04: 200 ug via INTRAVENOUS
  Administered 2018-08-04 (×3): 100 ug via INTRAVENOUS

## 2018-08-04 MED ORDER — ACETAMINOPHEN 10 MG/ML IV SOLN
INTRAVENOUS | Status: AC
  Filled 2018-08-04: qty 100

## 2018-08-04 MED ORDER — HYDROMORPHONE HCL 1 MG/ML IJ SOLN
0.2000 mg | INTRAMUSCULAR | Status: DC | PRN
  Administered 2018-08-04: 0.6 mg via INTRAVENOUS
  Filled 2018-08-04: qty 1

## 2018-08-04 MED ORDER — DEXAMETHASONE SODIUM PHOSPHATE 10 MG/ML IJ SOLN
INTRAMUSCULAR | Status: DC | PRN
  Administered 2018-08-04: 10 mg via INTRAVENOUS

## 2018-08-04 MED ORDER — METRONIDAZOLE IN NACL 5-0.79 MG/ML-% IV SOLN
500.0000 mg | Freq: Once | INTRAVENOUS | Status: AC
  Administered 2018-08-04: 500 mg via INTRAVENOUS
  Filled 2018-08-04: qty 100

## 2018-08-04 MED ORDER — ROCURONIUM BROMIDE 100 MG/10ML IV SOLN
INTRAVENOUS | Status: DC | PRN
  Administered 2018-08-04: 50 mg via INTRAVENOUS
  Administered 2018-08-04: 10 mg via INTRAVENOUS

## 2018-08-04 MED ORDER — BUPIVACAINE LIPOSOME 1.3 % IJ SUSP
INTRAMUSCULAR | Status: AC
  Filled 2018-08-04: qty 20

## 2018-08-04 MED ORDER — ONDANSETRON HCL 4 MG/2ML IJ SOLN: 4.0000 mg | Freq: Once | INTRAMUSCULAR | Status: DC | PRN

## 2018-08-04 SURGICAL SUPPLY — 64 items
BAG URINE DRAINAGE (UROLOGICAL SUPPLIES) ×4 IMPLANT
BLADE SURG SZ11 CARB STEEL (BLADE) ×4 IMPLANT
CANISTER SUCT 1200ML W/VALVE (MISCELLANEOUS) ×4 IMPLANT
CATH FOLEY 2WAY  5CC 16FR (CATHETERS) ×2
CATH URTH 16FR FL 2W BLN LF (CATHETERS) ×2 IMPLANT
CHLORAPREP W/TINT 26ML (MISCELLANEOUS) ×4 IMPLANT
CNTNR SPEC 2.5X3XGRAD LEK (MISCELLANEOUS) ×2
CONT SPEC 4OZ STER OR WHT (MISCELLANEOUS) ×2
CONTAINER SPEC 2.5X3XGRAD LEK (MISCELLANEOUS) IMPLANT
COVER WAND RF STERILE (DRAPES) ×4 IMPLANT
DEFOGGER SCOPE WARMER CLEARIFY (MISCELLANEOUS) ×4 IMPLANT
DERMABOND ADVANCED (GAUZE/BANDAGES/DRESSINGS) ×4
DERMABOND ADVANCED .7 DNX12 (GAUZE/BANDAGES/DRESSINGS) ×2 IMPLANT
DRAPE LEGGINS SURG 28X43 STRL (DRAPES) ×4 IMPLANT
DRAPE UNDER BUTTOCK W/FLU (DRAPES) ×4 IMPLANT
GLOVE BIO SURGEON STRL SZ 6.5 (GLOVE) ×2 IMPLANT
GLOVE BIO SURGEONS STRL SZ 6.5 (GLOVE) ×2
GLOVE INDICATOR 7.0 STRL GRN (GLOVE) ×2 IMPLANT
GLOVE PI ORTHOPRO 6.5 (GLOVE) ×4
GLOVE PI ORTHOPRO STRL 6.5 (GLOVE) ×2 IMPLANT
GLOVE SURG SYN 6.5 ES PF (GLOVE) ×12 IMPLANT
GLOVE SURG SYN 6.5 PF PI (GLOVE) ×4 IMPLANT
GOWN STRL REUS W/ TWL LRG LVL3 (GOWN DISPOSABLE) ×4 IMPLANT
GOWN STRL REUS W/TWL LRG LVL3 (GOWN DISPOSABLE) ×4
GRADUATE 1200CC STRL 31836 (MISCELLANEOUS) ×2 IMPLANT
GRASPER SUT TROCAR 14GX15 (MISCELLANEOUS) ×4 IMPLANT
HANDLE YANKAUER SUCT BULB TIP (MISCELLANEOUS) ×2 IMPLANT
IRRIGATION STRYKERFLOW (MISCELLANEOUS) IMPLANT
IRRIGATOR STRYKERFLOW (MISCELLANEOUS)
IV NS 1000ML (IV SOLUTION)
IV NS 1000ML BAXH (IV SOLUTION) IMPLANT
KIT PINK PAD W/HEAD ARE REST (MISCELLANEOUS) ×4
KIT PINK PAD W/HEAD ARM REST (MISCELLANEOUS) ×2 IMPLANT
KIT TURNOVER CYSTO (KITS) ×4 IMPLANT
L-HOOK LAP DISP 36CM (ELECTROSURGICAL) ×4
LABEL OR SOLS (LABEL) IMPLANT
LHOOK LAP DISP 36CM (ELECTROSURGICAL) ×2 IMPLANT
LIGASURE VESSEL 5MM BLUNT TIP (ELECTROSURGICAL) IMPLANT
MANIPULATOR UTERINE 4.5 ZUMI (MISCELLANEOUS) ×4 IMPLANT
NEEDLE HYPO 22GX1.5 SAFETY (NEEDLE) ×4 IMPLANT
NS IRRIG 500ML POUR BTL (IV SOLUTION) ×4 IMPLANT
PACK LAP CHOLECYSTECTOMY (MISCELLANEOUS) ×4 IMPLANT
PAD OB MATERNITY 4.3X12.25 (PERSONAL CARE ITEMS) ×4 IMPLANT
PAD PREP 24X41 OB/GYN DISP (PERSONAL CARE ITEMS) ×4 IMPLANT
PENCIL ELECTRO HAND CTR (MISCELLANEOUS) ×4 IMPLANT
POUCH SPECIMEN RETRIEVAL 10MM (ENDOMECHANICALS) IMPLANT
RELOAD LINEAR CUT PROX 55 BLUE (ENDOMECHANICALS) ×20 IMPLANT
RELOAD STAPLE 55 3.8 BLU REG (ENDOMECHANICALS) IMPLANT
RETRACTOR WOUND ALXS 18CM MED (MISCELLANEOUS) IMPLANT
RTRCTR WOUND ALEXIS O 18CM MED (MISCELLANEOUS) ×4
SCISSORS METZENBAUM CVD 33 (INSTRUMENTS) ×4 IMPLANT
SET TUBE SMOKE EVAC HIGH FLOW (TUBING) ×4 IMPLANT
SLEEVE ENDOPATH XCEL 5M (ENDOMECHANICALS) ×10 IMPLANT
SPONGE LAP 18X18 RF (DISPOSABLE) ×2 IMPLANT
STAPLER PROXIMATE 55 BLUE (STAPLE) ×4 IMPLANT
SUT MNCRL AB 4-0 PS2 18 (SUTURE) ×4 IMPLANT
SUT SILK 3-0 (SUTURE) ×2 IMPLANT
SUT VIC AB 2-0 UR6 27 (SUTURE) ×4 IMPLANT
SUT VIC AB 3-0 SH 27 (SUTURE) ×4
SUT VIC AB 3-0 SH 27X BRD (SUTURE) IMPLANT
SUT VICRYL+ 3-0 36IN CT-1 (SUTURE) ×4 IMPLANT
SYR 10ML LL (SYRINGE) ×4 IMPLANT
TOWEL OR 17X26 4PK STRL BLUE (TOWEL DISPOSABLE) ×2 IMPLANT
TROCAR XCEL NON-BLD 5MMX100MML (ENDOMECHANICALS) ×4 IMPLANT

## 2018-08-04 NOTE — Anesthesia Preprocedure Evaluation (Signed)
Anesthesia Evaluation  Patient identified by MRN, date of birth, ID band Patient awake    Reviewed: Allergy & Precautions, NPO status , Patient's Chart, lab work & pertinent test results, reviewed documented beta blocker date and time   Airway Mallampati: III  TM Distance: >3 FB     Dental  (+) Chipped   Pulmonary Current Smoker,    Pulmonary exam normal        Cardiovascular Normal cardiovascular exam     Neuro/Psych  Headaches, PSYCHIATRIC DISORDERS Anxiety  Neuromuscular disease    GI/Hepatic GERD  Controlled,  Endo/Other    Renal/GU      Musculoskeletal  (+) Arthritis , Fibromyalgia -  Abdominal Normal abdominal exam  (+)   Peds  Hematology   Anesthesia Other Findings Obese. ADD.  Reproductive/Obstetrics                             Anesthesia Physical  Anesthesia Plan  ASA: III  Anesthesia Plan: General   Post-op Pain Management:    Induction: Intravenous, Rapid sequence and Cricoid pressure planned  PONV Risk Score and Plan:   Airway Management Planned: Oral ETT  Additional Equipment:   Intra-op Plan:   Post-operative Plan: Extubation in OR and Possible Post-op intubation/ventilation  Informed Consent: I have reviewed the patients History and Physical, chart, labs and discussed the procedure including the risks, benefits and alternatives for the proposed anesthesia with the patient or authorized representative who has indicated his/her understanding and acceptance.       Plan Discussed with: CRNA and Surgeon  Anesthesia Plan Comments:         Anesthesia Quick Evaluation

## 2018-08-04 NOTE — Op Note (Addendum)
[facility] PROCEDURE DATE: 08/04/2018  PATIENT:  Megan Bennett  33 y.o. female  PRE-OPERATIVE DIAGNOSIS:  Post op pain, suspicion for perforated bowel  POST-OPERATIVE DIAGNOSIS:  perforated bowel  PROCEDURE:  Procedure(s): LAPAROSCOPY DIAGNOSTIC (N/A) EXPLORATORY LAPAROTOMY SMALL BOWEL RESECTION (N/A)  SURGEON:  Surgeon(s) and Role:    * Ward, Honor Loh, MD - Primary    Fredirick Maudlin, MD  ANESTHESIA:  General via ET  I/O  Total I/O  - see flow sheet In: - 1200 crystalloid,  Out: 50 [Urine:50], 25cc blood.   FINDINGS:   1. No fecal matter from sigmoid colon 2. inflammed but intact appendix 3. Purulent serosanguinous fluid in pelvis 4. Un-injured uterus, ovaries, sigmoid, and other pelvic structures 5. Succus fluid in the upper abdomen with inflammatory rind on liver surfaces 6. Normal appearing gall bladder 7. 49mm through-and-through trochar injury to mid-ileum. 8. No other injury to length of small bowel. 9. Air-filled cecum without perforation   SPECIMEN:  1. Intraabdominal fluid for culture 2. Small bowel segment  COMPLICATIONS: none apparent  DISPOSITION: vital signs stable to PACU   Indication for Surgery: 33 y.o. G1P1001 who had a seemingly uncomplicated laparoscopic bilateral tubal ligation yesterday presented back to the ED this morning with worsening abdominal pain and distension, diaphoresis, shortness of breath, tachycardia, and leukocytosis.  She had a larger-than-expected amount of free air beneath the diaphragm on chest x-ray and suspicion was high for bowel injury from surgery.  After discussion with general surgery, we decided to forgo a CT scan and bring the patient to the OR for a diagnostic laparoscopy.    Risks of surgery were discussed with the patient including but not limited to: bleeding which may require transfusion or reoperation; infection which may require antibiotics; injury to bowel, bladder, ureters or other  surrounding organs; need for additional procedures including laparotomy, blood clot, incisional problems and other postoperative/anesthesia complications. Written informed consent was obtained.    She was given antibiotics in the ED: cephalosporin and metronidazole   PROCEDURE IN DETAIL:  The patient had sequential compression devices applied to her lower extremities while in the preoperative area.  She was then taken to the operating room where general anesthesia was administered in rapid sequence.  She was placed in the dorsal lithotomy position, and was prepped and draped in a sterile manner.  A Foley catheter was inserted into her bladder and attached to constant drainage. An OG was placed and over 300cc fluid was extracted from the stomach.  The abdomen was soft, minimally tympanic, and non-distended at time of surgery.  After a surgical timeout was performed, attention was turned to the abdomen where an umbilical incision was made with the scalpel. A 35mm trochar was inserted in the umbilical incision using a visiport method. Opening pressure was 52mmHg, and the abdomen was insufflated to 2mmHg carbon dioxide gas and adequate pneumoperitoneum was obtained.  A survey of the patient's pelvis revealed the findings as mentioned above. Due to the initial concern of a ruptured appendix,  Two 1mm ports were inserted on the left under visualization.  When it was determined that the appendix was intact and reactionary, further exploration was performed, and in trendelenberg was when the pooling of green fluid was seen around the liver, confirming a small bowel injury.    At this time, conversion to an exploratory laparotomy was done, with a midline incision to and through the fascia and peritoneum with the bovie, and an alexis retractor was placed. Suction  of intraabdominal fluid was performed and then the small bowel was run from the terminal ileum to the ligament of Treitz.  The defect was noted in the  mid-ileum.  The mesentery was divided and GIA stapler with standard blue load was used to ligate and transect the adjacent bowel; the injured segment was removed with cautery.  The bowel loops were aligned, with correct orientation of the mesentery, and a  side-by-side, functional end-to-end anastamosis was performed, creating a patent lumen, again with the GIA stapler.   3-0 silk was used to secure bleeding ends and to reinforce the anastomosis.    4 liters of warm saline was used for irrigation until the fluid was clear.    The surgeons changed their gowns and the operative field was changed to a clean closure.  With new instruments, the fascia was closed with looped PDS in running stiches from opposing apices and secured in the midline.  20cc Exparel and 30cc marcaine were injected into the fascia.  The subcutaneous tissues were copiously irrigated and reapproximated with 3-0 vicryl.  4-0 monocryl was used to close the skin at the laparotomy and laparoscopic incisions. The incisions were covered in glue.  complications.    The patient tolerated the procedures well.  All instruments, needles, and sponge counts were correct x 2. The patient was taken to the recovery room in stable condition.   ---- Larey Days, MD Attending Obstetrician and [facility]

## 2018-08-04 NOTE — ED Notes (Signed)
Report given to stephen from OR in same day surgery. Annie Main to contact this RN when OR is ready for pt to be transported.

## 2018-08-04 NOTE — ED Triage Notes (Signed)
PT had tubal ligation yesterday. States vomiting, pain, and SOB that started last night. PT called surgeon this am and was told to follow up in ED. PT appears, HR 150 in triage.

## 2018-08-04 NOTE — ED Provider Notes (Signed)
[facility] Emergency Department Provider Note    First MD Initiated Contact with Patient 08/04/18 1153     (approximate)  I have reviewed the triage vital signs and the nursing notes.   HISTORY  Chief Complaint No chief complaint on file.    HPI SULY VUKELICH is a 33 y.o. female below listed past medical history presents the ER 1 day post outpatient laparoscopic tubal ligation presenting for evaluation of chest pain shortness of breath nausea vomiting and abdominal pain.  States she denies any keep anything down.  Does feel weak and short of breath.  Denies any history of blood clots.  Has been diagnosed with bronchitis previously denies any cough.  Is the pain is mild to moderate.    Past Medical History:  Diagnosis Date  . ADD (attention deficit disorder)   . Arthritis   . Fibromyalgia   . GERD (gastroesophageal reflux disease)   . Headache    MIGRAINES   No family history on file. Past Surgical History:  Procedure Laterality Date  . ESOPHAGOGASTRODUODENOSCOPY    . LAPAROSCOPIC TUBAL LIGATION Bilateral 08/03/2018   Procedure: LAPAROSCOPIC TUBAL LIGATION;  Surgeon: Ward, Honor Loh, MD;  Location: [facility] ORS;  Service: Gynecology;  Laterality: Bilateral;  . WISDOM TOOTH EXTRACTION Bilateral    Patient Active Problem List   Diagnosis Date Noted  . Indication for care in labor or delivery 05/25/2018      Prior to Admission medications   Medication Sig Start Date End Date Taking? Authorizing Provider  Prenatal Vit-Fe Fumarate-FA (PREPLUS) 27-1 MG TABS Take 1 tablet by mouth daily. 06/11/18  Yes [provider]  acetaminophen (TYLENOL) 500 MG tablet Take 2 tablets (1,000 mg total) by mouth every 6 (six) hours as needed (for pain scale < 4). Patient not taking: Reported on 06/24/2018 06/09/18   McVey, Murray Hodgkins, CNM  docusate sodium (COLACE) 100 MG capsule Take 1 capsule (100 mg total) by mouth 2 (two) times daily. Patient not  taking: Reported on 07/27/2018 06/09/18   McVey, Murray Hodgkins, CNM  ibuprofen (ADVIL,MOTRIN) 600 MG tablet Take 1 tablet (600 mg total) by mouth every 6 (six) hours. 08/03/18   Ward, Honor Loh, MD  omeprazole (PRILOSEC) 20 MG capsule Take 20 mg by mouth daily before breakfast.    [provider]  oxyCODONE (ROXICODONE) 5 MG immediate release tablet Take 1 tablet (5 mg total) by mouth every 4 (four) hours as needed for moderate pain. Patient not taking: Reported on 08/04/2018 08/03/18 08/03/19  Ward, Honor Loh, MD    Allergies Amoxicillin and Wellbutrin [bupropion]    Social History Social History   Tobacco Use  . Smoking status: Current Every Day Smoker    Packs/day: 0.25    Years: 20.00    Pack years: 5.00    Types: Cigarettes  . Smokeless tobacco: Never Used  Substance Use Topics  . Alcohol use: Yes    Alcohol/week: 1.0 standard drinks    Types: 1 Glasses of wine per week    Comment: OCC  . Drug use: Never    Review of Systems Patient denies headaches, rhinorrhea, blurry vision, numbness, shortness of breath, chest pain, edema, cough, abdominal pain, nausea, vomiting, diarrhea, dysuria, fevers, rashes or hallucinations unless otherwise stated above in HPI. ____________________________________________   PHYSICAL EXAM:  VITAL SIGNS: Vitals:   08/04/18 1200 08/04/18 1215  BP: 106/74   Pulse: (!) 110 (!) 131  Resp:  (!) 51  Temp:    SpO2:  94% 94%    Constitutional: Alert and oriented. Uncomfortable appearing Eyes: Conjunctivae are normal.  Head: Atraumatic. Nose: No congestion/rhinnorhea. Mouth/Throat: Mucous membranes are moist.   Neck: No stridor. Painless ROM.  Cardiovascular: Normal rate, regular rhythm. Grossly normal heart sounds.  Good peripheral circulation. Respiratory: Normal respiratory effort.  No retractions. Lungs CTAB. Gastrointestinal: Soft with mild ttp throughout. No distention. No abdominal bruits. No CVA tenderness. Genitourinary:    Musculoskeletal: No lower extremity tenderness nor edema.  No joint effusions. Neurologic:  Normal speech and language. No gross focal neurologic deficits are appreciated. No facial droop Skin:  Skin is warm, dry and intact. No rash noted. Psychiatric: Mood and affect are anxious. Speech and behavior are normal.  ____________________________________________   LABS (all labs ordered are listed, but only abnormal results are displayed)  Results for orders placed or performed during the hospital encounter of 08/04/18 (from the past 24 hour(s))  Lipase, blood     Status: None   Collection Time: 08/04/18 11:41 AM  Result Value Ref Range   Lipase 19 11 - 51 U/L  Comprehensive metabolic panel     Status: Abnormal   Collection Time: 08/04/18 11:41 AM  Result Value Ref Range   Sodium 134 (L) 135 - 145 mmol/L   Potassium 3.4 (L) 3.5 - 5.1 mmol/L   Chloride 104 98 - 111 mmol/L   CO2 18 (L) 22 - 32 mmol/L   Glucose, Bld 330 (H) 70 - 99 mg/dL   BUN 13 6 - 20 mg/dL   Creatinine, Ser 0.87 0.44 - 1.00 mg/dL   Calcium 8.5 (L) 8.9 - 10.3 mg/dL   Total Protein 6.7 6.5 - 8.1 g/dL   Albumin 3.6 3.5 - 5.0 g/dL   AST 30 15 - 41 U/L   ALT 34 0 - 44 U/L   Alkaline Phosphatase 72 38 - 126 U/L   Total Bilirubin 1.2 0.3 - 1.2 mg/dL   GFR calc non Af Amer >60 >60 mL/min   GFR calc Af Amer >60 >60 mL/min   Anion gap 12 5 - 15  CBC     Status: Abnormal   Collection Time: 08/04/18 11:41 AM  Result Value Ref Range   WBC 17.9 (H) 4.0 - 10.5 K/uL   RBC 4.89 3.87 - 5.11 MIL/uL   Hemoglobin 16.2 (H) 12.0 - 15.0 g/dL   HCT 47.7 (H) 36.0 - 46.0 %   MCV 97.5 80.0 - 100.0 fL   MCH 33.1 26.0 - 34.0 pg   MCHC 34.0 30.0 - 36.0 g/dL   RDW 12.3 11.5 - 15.5 %   Platelets 330 150 - 400 K/uL   nRBC 0.0 0.0 - 0.2 %   ____________________________________________  EKG My review and personal interpretation at Time: 11:27 Indication: abd pain  Rate: 140  Rhythm: sinus Axis: limb lead reversal Other: nonspecific st  abn, no stemi, sinus tachycardia, abnml ekg ____________________________________________  RADIOLOGY  I personally reviewed all radiographic images ordered to evaluate for the above acute complaints and reviewed radiology reports and findings.  These findings were personally discussed with the patient.  Please see medical record for radiology report.  ____________________________________________   PROCEDURES  Procedure(s) performed:  .Critical Care Performed by: Merlyn Lot, MD Authorized by: Merlyn Lot, MD   Critical care provider statement:    Critical care time (minutes):  30   Critical care time was exclusive of:  Separately billable procedures and treating other patients   Critical care was necessary to treat or prevent  imminent or life-threatening deterioration of the following conditions: sIRS, bowel perforation, acute abdomen.   Critical care was time spent personally by me on the following activities:  Development of treatment plan with patient or surrogate, discussions with consultants, evaluation of patient's response to treatment, examination of patient, obtaining history from patient or surrogate, ordering and performing treatments and interventions, ordering and review of laboratory studies, ordering and review of radiographic studies, pulse oximetry, re-evaluation of patient's condition and review of old charts      Critical Care performed: yes ____________________________________________   INITIAL IMPRESSION / [place] / ED COURSE  Pertinent labs & imaging results that were available during my care of the patient were reviewed by me and considered in my medical decision making (see chart for details).   DDX: Perforation, SBO, PE, pneumonia, sepsis, colitis  [place] B Therrien is a 33 y.o. who presents to the ED with acute abdominal pain shortness of breath tachycardia and symptoms as described above.  Patient very uncomfortable  appearing.  Will start IV fluids.  Blood will be sent for the by differential.  Will order stat chest x-ray.  Clinical Course as of Aug 05 1311  Tue Aug 04, 2018  1225 There is quite a fair amount of free air under the diaphragm.  Would expect some postoperatively but patient is tachycardic with white count.  Have lower suspicion for PE.  We will continue with IV fluids pain medication will consult with OB/GYN and gen surg.   [PR]  1660 Will give dose of IV antibiotics due her tachycardia abdominal pain and concern for possible perforation.  May simply be postop pain however I have consulted with Dr. Leonides Schanz who agrees to evaluate patient at bedside.   [PR]  1308 Patient evaluated by Dr. Leonides Schanz.  Plan to take to the OR for diagnostic lap due to concern for possible perforation.  Patient received IV antibiotics.  Will give additional IV fluids and IV pain medication.   [PR]    Clinical Course User Index [PR] Merlyn Lot, MD     As part of my medical decision making, I reviewed the following data within the [facility] notes reviewed and incorporated, Labs reviewed, notes from prior ED visits and Person Controlled Substance Database   ____________________________________________   FINAL CLINICAL IMPRESSION(S) / ED DIAGNOSES  Final diagnoses:  Acute abdominal pain  SIRS (systemic inflammatory response syndrome) ([place])      NEW MEDICATIONS STARTED DURING THIS VISIT:  New Prescriptions   No medications on file     Note:  This document was prepared using Dragon voice recognition software and may include unintentional dictation errors.    Merlyn Lot, MD 08/04/18 [id number]

## 2018-08-04 NOTE — Progress Notes (Signed)
Postop check:  S:  Patient awake, alert, feels "so much better", asking for PO drink and when she can eat.  No flatus, no pain currently.  Just finished pumping.    O: BP 106/76 (BP Location: Right Arm)   Pulse 88   Temp 97.7 F (36.5 C) (Oral)   Resp 18   Ht 5\' 8"  (1.727 m)   Wt 100 kg   SpO2 95%   BMI 33.51 kg/m     Intake/Output Summary (Last 24 hours) at 08/04/2018 2128 Last data filed at 08/04/2018 1930 Gross per 24 hour  Intake 2701.19 ml  Output 600 ml  Net 2101.19 ml    Respiratory normal, no effort, nasal cannula in place, O2 @ 2L Abdomen:  Appropriately tender, incision intact, covered in surgical glue.  Soft, No tympany, no distention. Foley: in place, amber urine within.  A/P: 33yo s/p ex lap with small bowel resection POD 0  1. Keep foley until lighter yellow and greater output.  Hopefully will remove in AM. 2. IV fluids turned off during pumping due to position of tubing- restart @ 175 and keep overnight.   3. Check CBC and CMP now, replete as needed, repeat in AM. 4. Continue antibiotics: rocephin and flagyl.   5. Incentive spirometer 6. Advance diet slowly.  May have SIPs of clears now - but if any fullness or belching, must stop.   7. Continue IV pain meds as needed, scheduled toradol and tylenol.  ----- Larey Days, MD Attending Obstetrician and Gynecologist [facility], Department of [facility]

## 2018-08-04 NOTE — Transfer of Care (Signed)
Immediate Anesthesia Transfer of Care Note  Patient: Megan Bennett  Procedure(s) Performed: LAPAROSCOPY DIAGNOSTIC (N/A ) SMALL BOWEL REPAIR (N/A Abdomen)  Patient Location: PACU  Anesthesia Type:General  Level of Consciousness: sedated  Airway & Oxygen Therapy: Patient Spontanous Breathing and Patient connected to face mask oxygen  Post-op Assessment: Report given to RN and Post -op Vital signs reviewed and stable  Post vital signs: Reviewed and stable  Last Vitals:  Vitals Value Taken Time  BP 112/72 08/04/2018  5:23 PM  Temp 36.6 C 08/04/2018  5:23 PM  Pulse 115 08/04/2018  5:26 PM  Resp 11 08/04/2018  5:26 PM  SpO2 91 % 08/04/2018  5:26 PM  Vitals shown include unvalidated device data.  Last Pain:  Vitals:   08/04/18 1723  TempSrc:   PainSc: 0-No pain         Complications: No apparent anesthesia complications

## 2018-08-04 NOTE — Consult Note (Signed)
Preoperative History and Physical  Megan Bennett is a 33 y.o. G1P1001 here for surgical management of postop complication.     She had an uneventful laparoscopic bilateral tubal ligation yesterday and overnight developed severe pain, abdominal distention, nausea and persistent vomiting, tachycardia, tachypnea, and sweating with chills.  No fever. She called the office this morning and was instructed to proceed to ED.    Proposed surgery: diagnostic / exploratory laparoscopy  Past Medical History:  Diagnosis Date  . ADD (attention deficit disorder)   . Arthritis   . Fibromyalgia   . GERD (gastroesophageal reflux disease)   . Headache    MIGRAINES   Past Surgical History:  Procedure Laterality Date  . ESOPHAGOGASTRODUODENOSCOPY    . LAPAROSCOPIC TUBAL LIGATION Bilateral 08/03/2018   Procedure: LAPAROSCOPIC TUBAL LIGATION;  Surgeon: Dannis Deroche, Honor Loh, MD;  Location: [facility] ORS;  Service: Gynecology;  Laterality: Bilateral;  . WISDOM TOOTH EXTRACTION Bilateral    OB History  Gravida Para Term Preterm AB Living  1 1 1     1   SAB TAB Ectopic Multiple Live Births        0 1    # Outcome Date GA Lbr Len/2nd Weight Sex Delivery Anes PTL Lv  1 Term 06/08/18 [redacted]w[redacted]d 07:45 / 02:29 3340 g F Vag-Spont EPI  LIV  Patient denies any other pertinent gynecologic issues.   No current facility-administered medications on file prior to encounter.    Current Outpatient Medications on File Prior to Encounter  Medication Sig Dispense Refill  . Prenatal Vit-Fe Fumarate-FA (PREPLUS) 27-1 MG TABS Take 1 tablet by mouth daily.    Marland Kitchen acetaminophen (TYLENOL) 500 MG tablet Take 2 tablets (1,000 mg total) by mouth every 6 (six) hours as needed (for pain scale < 4). (Patient not taking: Reported on 06/24/2018) 30 tablet 0  . docusate sodium (COLACE) 100 MG capsule Take 1 capsule (100 mg total) by mouth 2 (two) times daily. (Patient not taking: Reported on 07/27/2018) 10 capsule 0  . ibuprofen (ADVIL,MOTRIN)  600 MG tablet Take 1 tablet (600 mg total) by mouth every 6 (six) hours. 31 tablet 0  . omeprazole (PRILOSEC) 20 MG capsule Take 20 mg by mouth daily before breakfast.    . oxyCODONE (ROXICODONE) 5 MG immediate release tablet Take 1 tablet (5 mg total) by mouth every 4 (four) hours as needed for moderate pain. (Patient not taking: Reported on 08/04/2018) 20 tablet 0   Allergies  Allergen Reactions  . Amoxicillin Rash    Did it involve swelling of the face/tongue/throat, SOB, or low BP? No Did it involve sudden or severe rash/hives, skin peeling, or any reaction on the inside of your mouth or nose? No Did you need to seek medical attention at a hospital or doctor's office? No When did it last happen?14 + years If all above answers are "NO", may proceed with cephalosporin use.,   . Wellbutrin [Bupropion] Other (See Comments)    shakey    Social History:   reports that she has been smoking cigarettes. She has a 5.00 pack-year smoking history. She has never used smokeless tobacco. She reports current alcohol use of about 1.0 standard drinks of alcohol per week. She reports that she does not use drugs.  No family history on file.  Review of Systems: Noncontributory  PHYSICAL EXAM: Blood pressure 106/74, pulse (!) 131, temperature 98.3 F (36.8 C), temperature source Oral, resp. rate (!) 51, height 5\' 8"  (1.727 m), weight 100 kg, SpO2 94 %,  currently breastfeeding. General appearance - alert, well appearing, and in no distress Chest - clear to auscultation, no wheezes, rales or rhonchi, symmetric air entry Heart - normal rate and regular rhythm Abdomen - soft, nontender, nondistended, no masses or organomegaly Pelvic - examination not indicated Extremities - peripheral pulses normal, no pedal edema, no clubbing or cyanosis  Labs: Results for orders placed or performed during the hospital encounter of 08/04/18 (from the past 336 hour(s))  Lipase, blood   Collection Time: 08/04/18  11:41 AM  Result Value Ref Range   Lipase 19 11 - 51 U/L  Comprehensive metabolic panel   Collection Time: 08/04/18 11:41 AM  Result Value Ref Range   Sodium 134 (L) 135 - 145 mmol/L   Potassium 3.4 (L) 3.5 - 5.1 mmol/L   Chloride 104 98 - 111 mmol/L   CO2 18 (L) 22 - 32 mmol/L   Glucose, Bld 330 (H) 70 - 99 mg/dL   BUN 13 6 - 20 mg/dL   Creatinine, Ser 0.87 0.44 - 1.00 mg/dL   Calcium 8.5 (L) 8.9 - 10.3 mg/dL   Total Protein 6.7 6.5 - 8.1 g/dL   Albumin 3.6 3.5 - 5.0 g/dL   AST 30 15 - 41 U/L   ALT 34 0 - 44 U/L   Alkaline Phosphatase 72 38 - 126 U/L   Total Bilirubin 1.2 0.3 - 1.2 mg/dL   GFR calc non Af Amer >60 >60 mL/min   GFR calc Af Amer >60 >60 mL/min   Anion gap 12 5 - 15  CBC   Collection Time: 08/04/18 11:41 AM  Result Value Ref Range   WBC 17.9 (H) 4.0 - 10.5 K/uL   RBC 4.89 3.87 - 5.11 MIL/uL   Hemoglobin 16.2 (H) 12.0 - 15.0 g/dL   HCT 47.7 (H) 36.0 - 46.0 %   MCV 97.5 80.0 - 100.0 fL   MCH 33.1 26.0 - 34.0 pg   MCHC 34.0 30.0 - 36.0 g/dL   RDW 12.3 11.5 - 15.5 %   Platelets 330 150 - 400 K/uL   nRBC 0.0 0.0 - 0.2 %  Results for orders placed or performed during the hospital encounter of 08/03/18 (from the past 336 hour(s))  Pregnancy, urine POC   Collection Time: 08/03/18 11:48 AM  Result Value Ref Range   Preg Test, Ur NEGATIVE NEGATIVE  CBC   Collection Time: 08/03/18 11:49 AM  Result Value Ref Range   WBC 9.5 4.0 - 10.5 K/uL   RBC 4.36 3.87 - 5.11 MIL/uL   Hemoglobin 14.3 12.0 - 15.0 g/dL   HCT 41.4 36.0 - 46.0 %   MCV 95.0 80.0 - 100.0 fL   MCH 32.8 26.0 - 34.0 pg   MCHC 34.5 30.0 - 36.0 g/dL   RDW 12.0 11.5 - 15.5 %   Platelets 307 150 - 400 K/uL   nRBC 0.0 0.0 - 0.2 %  Basic metabolic panel   Collection Time: 08/03/18 11:49 AM  Result Value Ref Range   Sodium 138 135 - 145 mmol/L   Potassium 3.6 3.5 - 5.1 mmol/L   Chloride 112 (H) 98 - 111 mmol/L   CO2 19 (L) 22 - 32 mmol/L   Glucose, Bld 89 70 - 99 mg/dL   BUN 8 6 - 20 mg/dL    Creatinine, Ser 0.63 0.44 - 1.00 mg/dL   Calcium 9.1 8.9 - 10.3 mg/dL   GFR calc non Af Amer >60 >60 mL/min   GFR calc Af Amer >60 >  60 mL/min   Anion gap 7 5 - 15  Type and screen [place]   Collection Time: 08/03/18 11:49 AM  Result Value Ref Range   ABO/RH(D) O POS    Antibody Screen NEG    Sample Expiration      08/06/2018 Performed at [facility], [address] [place], [place] [address]     Imaging Studies: Dg Chest Portable 1 View  Result Date: 08/04/2018 CLINICAL DATA:  Tubal ligation yesterday. Vomiting. Pain. Shortness of breath. EXAM: PORTABLE CHEST 1 VIEW COMPARISON:  No prior. FINDINGS: Mediastinum and hilar structures normal. Bibasilar atelectasis. Free intraperitoneal air consistent with recent surgery. Clinical correlation suggested. Degenerative change thoracic spine. IMPRESSION: 1.  Bibasilar atelectasis. 2. Free intraperitoneal air consistent with recent surgery. Clinical correlation suggested. Electronically Signed   By: Marcello Moores  Register   On: 08/04/2018 12:38    Assessment: Patient Active Problem List   Diagnosis Date Noted  . Indication for care in labor or delivery 05/25/2018    Plan:  Discussed with Dr. Celine Ahr, from general surgery who is on call.  She is available to explore with me and to help with bowel perforation should one be identified.   Patient will undergo surgical management with diagnostic laparoscopy.   The risks of surgery were discussed in detail with the patient including but not limited to: bleeding which may require transfusion or reoperation; infection which may require antibiotics; injury to surrounding organs which may involve bowel, bladder, ureters ; need for additional procedures including laparoscopy or laparotomy; thromboembolic phenomenon, surgical site problems and other postoperative/anesthesia complications. Likelihood of success in alleviating the patient's condition was discussed. Routine  postoperative instructions will be reviewed with the patient and her family in detail after surgery.  The patient concurred with the proposed plan, giving informed written consent for the surgery.  Patient has been NPO since last night she will remain NPO for procedure.  Anesthesia and OR aware.  Preoperative prophylactic antibiotics and SCDs ordered on call to the OR.  To OR when ready.  ----- Larey Days, MD Attending Obstetrician and Gynecologist [facility], Department of [facility]  08/04/2018 1:34 PM

## 2018-08-04 NOTE — Progress Notes (Signed)
I strongly suggested that the pt call Dr. Guido Sander office immediately concerning her pain and the abnormal bleeding. She stated she would.

## 2018-08-04 NOTE — Anesthesia Post-op Follow-up Note (Signed)
Anesthesia QCDR form completed.        

## 2018-08-04 NOTE — Anesthesia Procedure Notes (Signed)
Procedure Name: Intubation Date/Time: 08/04/2018 2:43 PM Performed by: Kelton Pillar, CRNA Pre-anesthesia Checklist: Patient identified, Emergency Drugs available, Suction available and Patient being monitored Patient Re-evaluated:Patient Re-evaluated prior to induction Oxygen Delivery Method: Circle system utilized Preoxygenation: Pre-oxygenation with 100% oxygen Induction Type: IV induction, Rapid sequence and Cricoid Pressure applied Laryngoscope Size: McGraph and 3 Grade View: Grade I Tube type: Oral Tube size: 6.5 mm Number of attempts: 1 Airway Equipment and Method: Stylet Placement Confirmation: ETT inserted through vocal cords under direct vision,  positive ETCO2,  CO2 detector and breath sounds checked- equal and bilateral Secured at: 22 cm Tube secured with: Tape Dental Injury: Teeth and Oropharynx as per pre-operative assessment

## 2018-08-04 NOTE — ED Notes (Signed)
Pt clothing removed, all personal items sent with husband. Pt transported to OR by Same day surgery staff member

## 2018-08-05 ENCOUNTER — Encounter: Payer: Self-pay | Admitting: Obstetrics & Gynecology

## 2018-08-05 ENCOUNTER — Inpatient Hospital Stay: Payer: Medicaid Other

## 2018-08-05 LAB — COMPREHENSIVE METABOLIC PANEL
ALT: 24 U/L (ref 0–44)
AST: 20 U/L (ref 15–41)
Albumin: 2.6 g/dL — ABNORMAL LOW (ref 3.5–5.0)
Alkaline Phosphatase: 59 U/L (ref 38–126)
Anion gap: 6 (ref 5–15)
BUN: 16 mg/dL (ref 6–20)
CO2: 21 mmol/L — ABNORMAL LOW (ref 22–32)
Calcium: 7.4 mg/dL — ABNORMAL LOW (ref 8.9–10.3)
Chloride: 112 mmol/L — ABNORMAL HIGH (ref 98–111)
Creatinine, Ser: 0.84 mg/dL (ref 0.44–1.00)
GFR calc Af Amer: >60 mL/min (ref >60)
GFR calc non Af Amer: >60 mL/min (ref >60)
Glucose, Bld: 177 mg/dL — ABNORMAL HIGH (ref 70–99)
Potassium: 3.9 mmol/L (ref 3.5–5.1)
Sodium: 139 mmol/L (ref 135–145)
TOTAL PROTEIN: 5.1 g/dL — AB (ref 6.5–8.1)
Total Bilirubin: 0.3 mg/dL (ref 0.3–1.2)

## 2018-08-05 LAB — CBC
HEMATOCRIT: 38.3 % (ref 36.0–46.0)
Hemoglobin: 12.7 g/dL (ref 12.0–15.0)
MCH: 32.6 pg (ref 26.0–34.0)
MCHC: 33.2 g/dL (ref 30.0–36.0)
MCV: 98.2 fL (ref 80.0–100.0)
Platelets: 246 10*3/uL (ref 150–400)
RBC: 3.9 MIL/uL (ref 3.87–5.11)
RDW: 12.7 % (ref 11.5–15.5)
WBC: 19.4 10*3/uL — ABNORMAL HIGH (ref 4.0–10.5)
nRBC: 0 % (ref 0.0–0.2)

## 2018-08-05 LAB — SURGICAL PATHOLOGY

## 2018-08-05 MED ORDER — OXYCODONE HCL 5 MG PO TABS
5.0000 mg | ORAL_TABLET | ORAL | Status: DC | PRN
  Administered 2018-08-05 – 2018-08-12 (×5): 5 mg via ORAL
  Filled 2018-08-05 (×4): qty 1

## 2018-08-05 MED ORDER — BISACODYL 10 MG RE SUPP
10.0000 mg | Freq: Once | RECTAL | Status: AC
  Administered 2018-08-05: 10 mg via RECTAL
  Filled 2018-08-05: qty 1

## 2018-08-05 MED ORDER — ACETAMINOPHEN 500 MG PO TABS
1000.0000 mg | ORAL_TABLET | Freq: Four times a day (QID) | ORAL | Status: DC
  Administered 2018-08-05 – 2018-08-11 (×16): 1000 mg via ORAL
  Filled 2018-08-05 (×21): qty 2

## 2018-08-05 MED ORDER — OXYCODONE HCL 5 MG PO TABS
10.0000 mg | ORAL_TABLET | ORAL | Status: DC | PRN
  Administered 2018-08-05 – 2018-08-12 (×7): 10 mg via ORAL
  Filled 2018-08-05 (×8): qty 2

## 2018-08-05 MED ORDER — SODIUM CHLORIDE 0.9 % IV BOLUS
500.0000 mL | Freq: Once | INTRAVENOUS | Status: AC
  Administered 2018-08-05: 500 mL via INTRAVENOUS

## 2018-08-05 NOTE — Progress Notes (Deleted)
The only things I would change are "small bowel resection" rather than repair, and add the word "functional" to the anastomosis--"side-to-side, functional end-to-end" anastomosis.  Glad she's doing well!

## 2018-08-05 NOTE — Progress Notes (Addendum)
[place] Hospital Day(s): 1.   Post op day(s): 1 Day Post-Op.   Interval History: Patient seen and examined, no acute events or new complaints overnight. Patient reports incisional abdominal pain worse with movement and inspiration. No complaints of fever, chills, nausea, or emesis. She feels short of breath because pain makes it difficult to take a good breath. Tolerating sips of clears. No reports of flatus. Has not tried ambulating.     Review of Systems:  Constitutional: denies fever, chills  Respiratory: + shortness of breath  Gastrointestinal: + abdominal pain (incisional), denied N/V, or diarrhea/and bowel function as per interval history Integumentary: denies any other rashes or skin discolorations except surgical incisions   Vital signs in last 24 hours: [min-max] current  Temp:  [97.5 F (36.4 C)-98.5 F (36.9 C)] 97.5 F (36.4 C) (02/26 0319) Pulse Rate:  [82-143] 82 (02/26 0319) Resp:  [10-51] 20 (02/26 0319) BP: (104-121)/(68-85) 105/68 (02/26 0319) SpO2:  [91 %-100 %] 95 % (02/26 0319) Weight:  [100 kg] 100 kg (02/25 1412)     Height: 5\' 8"  (172.7 cm) Weight: 100 kg BMI (Calculated): 33.52   Intake/Output this shift:  No intake/output data recorded.     Physical Exam:  Constitutional: alert, cooperative and no distress  Respiratory: Taking short shallow breath, CTAB, on 3L [place] Cardiovascular: regular rate and sinus rhythm  Gastrointestinal: soft, incisional tenderness, and non-distended. No rebound/guarding Integumentary: Midline mini-laparotomy incision is CDI, there is surrounding ecchymosis, no erythema or drainage. Laparoscopic incisions are CDI, no erythema or drainage.   Labs:  CBC Latest Ref Rng & Units 08/05/2018 08/04/2018 08/03/2018  WBC 4.0 - 10.5 K/uL 19.4(H) 17.9(H) 9.5  Hemoglobin 12.0 - 15.0 g/dL 12.7 16.2(H) 14.3  Hematocrit 36.0 - 46.0 % 38.3 47.7(H) 41.4  Platelets 150 - 400 K/uL 246 330 307   CMP  Latest Ref Rng & Units 08/05/2018 08/04/2018 08/03/2018  Glucose 70 - 99 mg/dL 177(H) 330(H) 89  BUN 6 - 20 mg/dL 16 13 8   Creatinine 0.44 - 1.00 mg/dL 0.84 0.87 0.63  Sodium 135 - 145 mmol/L 139 134(L) 138  Potassium 3.5 - 5.1 mmol/L 3.9 3.4(L) 3.6  Chloride 98 - 111 mmol/L 112(H) 104 112(H)  CO2 22 - 32 mmol/L 21(L) 18(L) 19(L)  Calcium 8.9 - 10.3 mg/dL 7.4(L) 8.5(L) 9.1  Total Protein 6.5 - 8.1 g/dL 5.1(L) 6.7 -  Total Bilirubin 0.3 - 1.2 mg/dL 0.3 1.2 -  Alkaline Phos 38 - 126 U/L 59 72 -  AST 15 - 41 U/L 20 30 -  ALT 0 - 44 U/L 24 34 -     Imaging studies: No new pertinent imaging studies   Assessment/Plan:  33 y.o. female without significant return of bowel function otherwise going well 1 Day Post-Op s/p exploratory laparotomy, small bowel resection, and EEA for perforated small bowel 2 days s/p laparoscopic tubal ligation, complicated by pertinent comorbidities including history of fibromyalgia, GERD, obesity, and current tobacco abuse.   -  Continue clear liquid diet until significant bowel function returns.   -  IVF (LR) until tolerating full liquids,   - Discontinue foley as U/O and color improves  - Monitor abdominal exam, leukocytosis  - Ambulation encouraged  - Encouraged IS use hourly  - Further management per primary team  All of the above findings and recommendations were discussed with the patient, and the medical team, and all of patient's questions were answered to her expressed satisfaction.  -- Edison Simon,  PA-C [facility] 08/05/2018, 7:50 AM [phone] M-F: 7am - 4pm  I saw and evaluated the patient.  I agree with the above documentation, exam, and plan, which I have edited where appropriate. Fredirick Maudlin  5:03 PM

## 2018-08-06 LAB — CBC
HCT: 36.7 % (ref 36.0–46.0)
Hemoglobin: 11.5 g/dL — ABNORMAL LOW (ref 12.0–15.0)
MCH: 32.9 pg (ref 26.0–34.0)
MCHC: 31.3 g/dL (ref 30.0–36.0)
MCV: 104.9 fL — ABNORMAL HIGH (ref 80.0–100.0)
Platelets: 153 10*3/uL (ref 150–400)
RBC: 3.5 MIL/uL — ABNORMAL LOW (ref 3.87–5.11)
RDW: 12.7 % (ref 11.5–15.5)
WBC: 12.8 10*3/uL — ABNORMAL HIGH (ref 4.0–10.5)
nRBC: 0 % (ref 0.0–0.2)

## 2018-08-06 LAB — COMPREHENSIVE METABOLIC PANEL
ALT: 20 U/L (ref 0–44)
AST: 14 U/L — ABNORMAL LOW (ref 15–41)
Albumin: 2.5 g/dL — ABNORMAL LOW (ref 3.5–5.0)
Alkaline Phosphatase: 72 U/L (ref 38–126)
Anion gap: 6 (ref 5–15)
BUN: 9 mg/dL (ref 6–20)
CO2: 24 mmol/L (ref 22–32)
Calcium: 7.9 mg/dL — ABNORMAL LOW (ref 8.9–10.3)
Chloride: 108 mmol/L (ref 98–111)
Creatinine, Ser: 0.49 mg/dL (ref 0.44–1.00)
GFR calc Af Amer: >60 mL/min (ref >60)
GFR calc non Af Amer: >60 mL/min (ref >60)
Glucose, Bld: 101 mg/dL — ABNORMAL HIGH (ref 70–99)
Potassium: 3.2 mmol/L — ABNORMAL LOW (ref 3.5–5.1)
Sodium: 138 mmol/L (ref 135–145)
TOTAL PROTEIN: 5.3 g/dL — AB (ref 6.5–8.1)
Total Bilirubin: 0.5 mg/dL (ref 0.3–1.2)

## 2018-08-06 LAB — SURGICAL PATHOLOGY

## 2018-08-06 MED ORDER — IBUPROFEN 600 MG PO TABS
600.0000 mg | ORAL_TABLET | Freq: Four times a day (QID) | ORAL | Status: DC
  Administered 2018-08-08: 600 mg via ORAL
  Filled 2018-08-06 (×3): qty 1

## 2018-08-06 MED ORDER — PRENATAL MULTIVITAMIN CH
1.0000 | ORAL_TABLET | Freq: Every day | ORAL | Status: DC
  Administered 2018-08-06 – 2018-08-12 (×3): 1 via ORAL
  Filled 2018-08-06 (×5): qty 1

## 2018-08-06 NOTE — Progress Notes (Signed)
Obstetric and Gynecology  POD 1  Subjective  Patient doing well, complaints of pain, some belching, no flatus.  Foley still in, not been OOB yet.  Complains of needing to cough but doesn't want to try due to abdominal pain, says "hard to breathe" but admits she doesn't have lung pain, rib pain, but is purposely taking shallower breaths because of abdominal pain.  Denies CP, SOB, F/C, N/V/D, or leg pain.   Objective  Objective:  98.2 132/88  93  20  94% on 3L [place] General: NAD Cardiovascular: RRR, no murmurs Pulmonary: CTAB, normal respiratory effort Abdomen: appropriately tender, +BS, no guarding. Incision: developing ecchymosis surrounding, intact, no discharge/drainage, no bleeding. Extremities: No erythema or cords, no calf tenderness, with normal peripheral pulses.  Labs: Results for orders placed or performed during the hospital encounter of 08/04/18 (from the past 48 hour(s))  CBC     Status: Abnormal   Collection Time: 08/05/18  1:58 AM  Result Value Ref Range   WBC 19.4 (H) 4.0 - 10.5 K/uL   RBC 3.90 3.87 - 5.11 MIL/uL   Hemoglobin 12.7 12.0 - 15.0 g/dL   HCT 38.3 36.0 - 46.0 %   MCV 98.2 80.0 - 100.0 fL   MCH 32.6 26.0 - 34.0 pg   MCHC 33.2 30.0 - 36.0 g/dL   RDW 12.7 11.5 - 15.5 %   Platelets 246 150 - 400 K/uL   nRBC 0.0 0.0 - 0.2 %    Comment: Performed at [facility], [address] [place], [place] [address]  Comprehensive metabolic panel     Status: Abnormal   Collection Time: 08/05/18  1:58 AM  Result Value Ref Range   Sodium 139 135 - 145 mmol/L   Potassium 3.9 3.5 - 5.1 mmol/L   Chloride 112 (H) 98 - 111 mmol/L   CO2 21 (L) 22 - 32 mmol/L   Glucose, Bld 177 (H) 70 - 99 mg/dL   BUN 16 6 - 20 mg/dL   Creatinine, Ser 0.84 0.44 - 1.00 mg/dL   Calcium 7.4 (L) 8.9 - 10.3 mg/dL   Total Protein 5.1 (L) 6.5 - 8.1 g/dL   Albumin 2.6 (L) 3.5 - 5.0 g/dL   AST 20 15 - 41 U/L   ALT 24 0 - 44 U/L   Alkaline Phosphatase 59 38 - 126 U/L   Total  Bilirubin 0.3 0.3 - 1.2 mg/dL   GFR calc non Af Amer >60 >60 mL/min   GFR calc Af Amer >60 >60 mL/min   Anion gap 6 5 - 15    Comment: Performed at [facility], [address], [place],  [address]     Cultures: Still pending   Assessment   33 y.o. [place] Hospital Day: 2, POD 1 from dx lap, ex lap and small bowel resection.  Plan   1. Continue NS @ 125cc/hr 2. NPO, sips of clears.  If belching increases go back to NPO strictly. 3. Encourage ambulation 4. Continue abx, rising leukocytosis likely reactionary, recheck tomorrow. 5. Lactation support - breast pump at bedside 6. Incentive spriometry hourly while awake 7. Pain control - continue IV toradol, change to PO tylenol, oxycodone  ----- Larey Days, MD Attending Obstetrician and Gynecologist [facility], Department of [facility]

## 2018-08-06 NOTE — Progress Notes (Signed)
On morning assessment, pt complained that no one is helping her with the breastpump.  When asked last time she pumped, she stated yesterday with her husband.  Night shift RN reported helping patient pump last night during morning report.  Pt does not report any discomfort in her breast.  This RN offered to help her pump and pt declined, stating that she was too tired to ambulate or pump at this time.  RN encouraged her to let her know when she woke up, at that time we will pump than ambulate around the nurses station.  Pt verbally agreed to this plan.

## 2018-08-06 NOTE — Progress Notes (Signed)
Obstetric and Gynecology  POD 2  Subjective  AM: Patient doing well, complaints of exhaustion, tolerating PO intake, tolerating pain with PO meds, ambulating only to bathroom, voiding spontaneously.     PM: patient ambulated in hallways and had BM   Denies CP, SOB, F/C, N/V/D, or leg pain.   Objective  Objective:   Vitals:   08/06/18 1150 08/06/18 1309 08/06/18 1642 08/06/18 1956  BP: 121/78 124/68 121/68 131/89  Pulse: 70 80 77 85  Resp: 18 20 18 20   Temp: 98.3 F (36.8 C) 98.5 F (36.9 C) 98.3 F (36.8 C) 99.6 F (37.6 C)  TempSrc:  Oral Oral Oral  SpO2: 96% 98% 97% 95%  Weight:      Height:         Intake/Output Summary (Last 24 hours) at 08/06/2018 2058 Last data filed at 08/06/2018 1853 Gross per 24 hour  Intake 2883.44 ml  Output 927 ml  Net 1956.44 ml    General: NAD Cardiovascular: RRR, no murmurs Pulmonary: CTAB, normal respiratory effort Abdomen: Benign. Appropriately tender, +BS, no guarding.  Incision: marked, no spread of erythema or ecchymosis Extremities: No erythema or cords, no calf tenderness, with normal peripheral pulses.  Labs: Results for orders placed or performed during the hospital encounter of 08/04/18 (from the past 24 hour(s))  CBC     Status: Abnormal   Collection Time: 08/06/18  6:07 PM  Result Value Ref Range   WBC 12.8 (H) 4.0 - 10.5 K/uL   RBC 3.50 (L) 3.87 - 5.11 MIL/uL   Hemoglobin 11.5 (L) 12.0 - 15.0 g/dL   HCT 36.7 36.0 - 46.0 %   MCV 104.9 (H) 80.0 - 100.0 fL   MCH 32.9 26.0 - 34.0 pg   MCHC 31.3 30.0 - 36.0 g/dL   RDW 12.7 11.5 - 15.5 %   Platelets 153 150 - 400 K/uL   nRBC 0.0 0.0 - 0.2 %  Comprehensive metabolic panel     Status: Abnormal   Collection Time: 08/06/18  6:07 PM  Result Value Ref Range   Sodium 138 135 - 145 mmol/L   Potassium 3.2 (L) 3.5 - 5.1 mmol/L   Chloride 108 98 - 111 mmol/L   CO2 24 22 - 32 mmol/L   Glucose, Bld 101 (H) 70 - 99 mg/dL   BUN 9 6 - 20 mg/dL   Creatinine, Ser 0.49 0.44 -  1.00 mg/dL   Calcium 7.9 (L) 8.9 - 10.3 mg/dL   Total Protein 5.3 (L) 6.5 - 8.1 g/dL   Albumin 2.5 (L) 3.5 - 5.0 g/dL   AST 14 (L) 15 - 41 U/L   ALT 20 0 - 44 U/L   Alkaline Phosphatase 72 38 - 126 U/L   Total Bilirubin 0.5 0.3 - 1.2 mg/dL   GFR calc non Af Amer >60 >60 mL/min   GFR calc Af Amer >60 >60 mL/min   Anion gap 6 5 - 15    Cultures: Results for orders placed or performed during the hospital encounter of 08/04/18  Body fluid culture     Status: None (Preliminary result)   Collection Time: 08/04/18  3:11 PM  Result Value Ref Range Status   Specimen Description   Final    FLUID Performed at [facility], [address], [place], [place] [address]    Special Requests   Final    FLUID Performed at [facility], [address] [place], [place] [address]    Gram Stain  Final    ABUNDANT WBC PRESENT, PREDOMINANTLY PMN NO ORGANISMS SEEN    Culture   Final    NO GROWTH 2 DAYS Performed at [facility] [address] [place], [place] [address]    Report Status PENDING  Incomplete    Imaging: Dg Chest Port 1 View  Result Date: 08/05/2018 CLINICAL DATA:  Initial evaluation for acute shortness of breath. EXAM: PORTABLE CHEST 1 VIEW COMPARISON:  Prior radiograph from 08/04/2018. FINDINGS: Cardiac and mediastinal silhouette stable in size and contour, and remain within normal limits. Lungs hypoinflated. Veiling opacity overlying the right hemidiaphragm compatible with layering pleural effusion. Probable small left pleural effusion present as well. Mild diffuse pulmonary vascular congestion without frank pulmonary edema. Superimposed bibasilar opacities favored to reflect atelectasis, although infiltrate could be considered in the correct clinical setting. No pneumothorax. Osseous structures unchanged. Probable small amount of residual free intraperitoneal air suspected, although decreased and not as well visualized as on prior exam. IMPRESSION: 1. Low  lung volumes with small to moderate layering right pleural effusion, with additional small left effusion. Associated bibasilar opacities favored to reflect atelectasis, although superimposed infiltrate could be considered in the correct clinical setting, particularly at the right lung base. 2. Underlying mild pulmonary vascular congestion without overt pulmonary edema. Electronically Signed   By: Jeannine Boga M.D.   On: 08/05/2018 17:04      Assessment   33 y.o. [place] Hospital Day: 3 POD 2 from dx lap, ex lap and small bowel resection  Plan   1. Continue PO pain control 2. Continue frequent amblation 3. Advance diet as tolerated 4. Continue lactation support 5. IV fluids to TKO 6. Continue incentive spirometry  ----- Larey Days, MD Attending Obstetrician and Gynecologist [facility], Department of [facility]

## 2018-08-06 NOTE — Progress Notes (Addendum)
[facility] Day(s): 2.   Post op day(s): 2 Days Post-Op.   Interval History: Patient seen and examined, no acute events or new complaints overnight. Patient reports she feels a little more comfortable this morning but is anxious about getting food. Still reports abdominal soreness near her incisions. No complaints of fever, chills, nausea, or emesis. She denied any flatus. Still reporting significant cough with phlegm production. Has not mobilized further than to the bathroom.   Review of Systems:  Constitutional: denies fever, chills  HEENT: + Cough Gastrointestinal: + abdominal pain (Incisional), denied N/V, or diarrhea/and bowel function as per interval history Integumentary: denies any other rashes or skin discolorations except surgical incisions   Vital signs in last 24 hours: [min-max] current  Temp:  [98 F (36.7 C)-98.6 F (37 C)] 98 F (36.7 C) (02/27 0807) Pulse Rate:  [70-93] 70 (02/27 0807) Resp:  [18-22] 18 (02/27 0807) BP: (113-135)/(64-88) 131/87 (02/27 0807) SpO2:  [93 %-97 %] 96 % (02/27 0807)     Height: 5\' 8"  (172.7 cm) Weight: 100 kg BMI (Calculated): 33.52   Intake/Output this shift:  No intake/output data recorded.    Physical Exam:  Constitutional: alert, cooperative and no distress  Respiratory: Taking short shallow breath, CTAB, on 3L [place] Cardiovascular: regular rate and sinus rhythm  Gastrointestinal: soft, incisional tenderness, and non-distended. No rebound/guarding Integumentary: Midline mini-laparotomy incision is CDI, there is surrounding ecchymosis, no erythema or drainage. Laparoscopic incisions are CDI, no erythema or drainage.   Labs:  CBC Latest Ref Rng & Units 08/05/2018 08/04/2018 08/03/2018  WBC 4.0 - 10.5 K/uL 19.4(H) 17.9(H) 9.5  Hemoglobin 12.0 - 15.0 g/dL 12.7 16.2(H) 14.3  Hematocrit 36.0 - 46.0 % 38.3 47.7(H) 41.4  Platelets 150 - 400 K/uL 246 330 307   CMP Latest Ref Rng & Units  08/05/2018 08/04/2018 08/03/2018  Glucose 70 - 99 mg/dL 177(H) 330(H) 89  BUN 6 - 20 mg/dL 16 13 8   Creatinine 0.44 - 1.00 mg/dL 0.84 0.87 0.63  Sodium 135 - 145 mmol/L 139 134(L) 138  Potassium 3.5 - 5.1 mmol/L 3.9 3.4(L) 3.6  Chloride 98 - 111 mmol/L 112(H) 104 112(H)  CO2 22 - 32 mmol/L 21(L) 18(L) 19(L)  Calcium 8.9 - 10.3 mg/dL 7.4(L) 8.5(L) 9.1  Total Protein 6.5 - 8.1 g/dL 5.1(L) 6.7 -  Total Bilirubin 0.3 - 1.2 mg/dL 0.3 1.2 -  Alkaline Phos 38 - 126 U/L 59 72 -  AST 15 - 41 U/L 20 30 -  ALT 0 - 44 U/L 24 34 -     Imaging studies: No new pertinent imaging studies   Assessment/Plan:  33 y.o. female with post-surgical ileus still without significant return of bowel function 2 Days Post-Op s/p exploratory laparotomy, small bowel resection, and EEA for perforated small bowel 3 days s/p laparoscopic tubal ligation, complicated by pertinent comorbidities including history of fibromyalgia, GERD, obesity, and current tobacco abuse.   - Okay for clear liquid diet, would not advance until bowel function return   - Continue IVF, IV ABx, pain control prn (minimize narcotics)   - Monitor leukocytosis, abdominal exam, on-going bowel function   - Encouraged ambulation, would not hesitate to engage PT  - Continue IS use (pulling 750 ccs), encouraged hourly use  - Consider Mucinex for cough/phlegm  - Further management per primary team   All of the above findings and recommendations were discussed with the patient, and the medical team, and all of patient's questions were  answered to her expressed satisfaction.  -- Edison Simon, PA-C [facility] 08/06/2018, 8:20 AM [phone] M-F: 7am - 4pm  I saw and evaluated the patient.  I agree with the above documentation, exam, and plan, which I have edited where appropriate. Fredirick Maudlin  10:51 AM

## 2018-08-07 ENCOUNTER — Inpatient Hospital Stay: Payer: Medicaid Other

## 2018-08-07 LAB — BODY FLUID CULTURE: Culture: NO GROWTH

## 2018-08-07 MED ORDER — DEXTROMETHORPHAN POLISTIREX ER 30 MG/5ML PO SUER
30.0000 mg | Freq: Two times a day (BID) | ORAL | Status: DC
  Administered 2018-08-07 – 2018-08-12 (×10): 30 mg via ORAL
  Filled 2018-08-07 (×15): qty 5

## 2018-08-07 MED ORDER — BISACODYL 10 MG RE SUPP
10.0000 mg | Freq: Once | RECTAL | Status: AC
  Administered 2018-08-07: 10 mg via RECTAL
  Filled 2018-08-07: qty 1

## 2018-08-07 MED ORDER — DM-GUAIFENESIN ER 30-600 MG PO TB12: 1.0000 | ORAL_TABLET | Freq: Two times a day (BID) | ORAL | Status: DC

## 2018-08-07 MED ORDER — KCL-LACTATED RINGERS 20 MEQ/L IV SOLN
INTRAVENOUS | Status: DC
  Administered 2018-08-07 – 2018-08-08 (×2): via INTRAVENOUS
  Filled 2018-08-07 (×6): qty 1000

## 2018-08-07 MED ORDER — IOPAMIDOL (ISOVUE-370) INJECTION 76%
100.0000 mL | Freq: Once | INTRAVENOUS | Status: AC | PRN
  Administered 2018-08-07: 100 mL via INTRAVENOUS

## 2018-08-07 MED ORDER — ONDANSETRON HCL 4 MG/2ML IJ SOLN
4.0000 mg | Freq: Once | INTRAMUSCULAR | Status: AC
  Administered 2018-08-07: 4 mg via INTRAVENOUS
  Filled 2018-08-07: qty 2

## 2018-08-07 MED ORDER — GUAIFENESIN ER 600 MG PO TB12
600.0000 mg | ORAL_TABLET | Freq: Two times a day (BID) | ORAL | Status: DC
  Administered 2018-08-07 – 2018-08-12 (×10): 600 mg via ORAL
  Filled 2018-08-07 (×12): qty 1

## 2018-08-07 NOTE — Evaluation (Signed)
Physical Therapy Evaluation Patient Details Name: Megan Bennett MRN: [id number] DOB: [birth year] Today's Date: 08/07/2018   History of Present Illness  33 y/o female 8 weeks post partum, had tubal ligation 2/58 with complications and is now post lapro with bowel resection and post surgical ulius.  History of fibromyalgia, GERD, obesity, and current tobacco abuse.  Clinical Impression  Pt anxious about getting up with PT, but did ultimately show good effort with getting to EOB and then standing and walking.  She was very slow, hesitant with heavy UE reliance but did manage to walk ~135 ft.  At this point pt would benefit from RW for ambulation but we discussed that she should be transitioning off this relatively soon.  She should not need HHPT on d/c, but needs to stay active while here, encouraged her to walk out of room BID with RNs.      Follow Up Recommendations No PT follow up(will maintain on caseload while in house)    Equipment Recommendations  Rolling walker with 5" wheels    Recommendations for Other Services       Precautions / Restrictions Precautions Precautions: None Restrictions Weight Bearing Restrictions: No      Mobility  Bed Mobility Overal bed mobility: Needs Assistance Bed Mobility: Supine to Sit     Supine to sit: Min assist     General bed mobility comments: Pt with abdominal pain limiting mobility, needed light assist to attain upright sitting at EOB  Transfers Overall transfer level: Modified independent Equipment used: Rolling walker (2 wheeled)             General transfer comment: Pt was able to shift weight forward and with heavy UE assist and effort she was able to attain near upright (stayed stooped t/o standing secondary to abdominal pain)  Ambulation/Gait Ambulation/Gait assistance: Min guard Gait Distance (Feet): 135 Feet Assistive device: Rolling walker (2 wheeled);1 person hand held assist     Gait velocity  interpretation: <1.31 ft/sec, indicative of household ambulator General Gait Details: Pt with very slow, labored ambulation with heavy reliance on the walker.  She c/o pain t/o the effort, but did show increased comfort, speed and confidence with increased distance despite c/o pain t/o.  She was very anxious but did manage to push herself though with encouragement from PT, RN and family.  Pt was able to take a few steps with 1 hand on hallway rail and PT HHA, however she had heavy UE reliance.  Stairs            Wheelchair Mobility    Modified Rankin (Stroke Patients Only)       Balance Overall balance assessment: Needs assistance Sitting-balance support: Bilateral upper extremity supported Sitting balance-Leahy Scale: Good       Standing balance-Leahy Scale: Fair Standing balance comment: very heavy use of UEs during all standing/WBing                             Pertinent Vitals/Pain Pain Assessment: 0-10 Pain Score: 6  Pain Location: abdominal     Home Living Family/patient expects to be discharged to:: Private residence Living Arrangements: Spouse/significant other Available Help at Discharge: Family           Home Equipment: None      Prior Function Level of Independence: Independent         Comments: prior to this week she was independent and active  Hand Dominance        Extremity/Trunk Assessment   Upper Extremity Assessment Upper Extremity Assessment: Generalized weakness    Lower Extremity Assessment Lower Extremity Assessment: Generalized weakness       Communication   Communication: No difficulties  Cognition Arousal/Alertness: Awake/alert Behavior During Therapy: Anxious Overall Cognitive Status: Within Functional Limits for tasks assessed                                        General Comments      Exercises     Assessment/Plan    PT Assessment Patient needs continued PT services  PT  Problem List Decreased strength;Decreased activity tolerance;Decreased balance;Decreased mobility;Decreased knowledge of use of DME;Decreased safety awareness;Pain       PT Treatment Interventions DME instruction;Gait training;Stair training;Functional mobility training;Therapeutic activities;Therapeutic exercise;Balance training;Cognitive remediation    PT Goals (Current goals can be found in the Care Plan section)  Acute Rehab PT Goals Patient Stated Goal: go home and see her baby PT Goal Formulation: With patient Time For Goal Achievement: 08/21/18 Potential to Achieve Goals: Good    Frequency Min 2X/week   Barriers to discharge        Co-evaluation               AM-PAC PT "6 Clicks" Mobility  Outcome Measure Help needed turning from your back to your side while in a flat bed without using bedrails?: A Little Help needed moving from lying on your back to sitting on the side of a flat bed without using bedrails?: A Little Help needed moving to and from a bed to a chair (including a wheelchair)?: None Help needed standing up from a chair using your arms (e.g., wheelchair or bedside chair)?: A Little Help needed to walk in hospital room?: A Little Help needed climbing 3-5 steps with a railing? : A Little 6 Click Score: 19    End of Session Equipment Utilized During Treatment: Gait belt Activity Tolerance: Patient limited by pain;Patient limited by fatigue Patient left: in chair;with family/visitor present Nurse Communication: Mobility status(drop in O2 (on room air) from low 90s to 88% with amb) PT Visit Diagnosis: Difficulty in walking, not elsewhere classified (R26.2);Pain;Muscle weakness (generalized) (M62.81) Pain - part of body: (abdominal)    Time: 1530-1605 PT Time Calculation (min) (ACUTE ONLY): 35 min   Charges:   PT Evaluation $PT Eval Low Complexity: 1 Low PT Treatments $Gait Training: 8-22 mins        Kreg Shropshire, DPT 08/07/2018, 6:10 PM

## 2018-08-07 NOTE — Progress Notes (Signed)
Dr. Leonides Schanz contacted to report results of CT scans.  No further orders received at this time.

## 2018-08-07 NOTE — Progress Notes (Signed)
Contacted Dr. Leonides Schanz to report that patient refuses to ambulate, utilize IS, and TCDB due to complaints of nausea, fatigue, dizziness, SOB, and abdominal pain.  Patient stating "Something is wrong with me."  Dr. Leonides Schanz and RN discussed lab values, vital signs, and clinical signs/symptoms.  MD states to contact surgical team to request recommendations for further imaging.  Dr. Celine Ahr contacted, and orders discussed.  Orders for PT consult, clear liquid diet, and  CT of Abdomen, Pelvis, and Chest received.

## 2018-08-07 NOTE — Progress Notes (Signed)
PT Cancellation Note  Patient Details Name: Megan Bennett MRN: [id number] DOB: [birth year]   Cancelled Treatment:    Reason Eval/Treat Not Completed: Other (comment).  PT consult received.  Chart reviewed.  Pt noted to be pending CT angio chest to r/o PE.  Discussed this with pt's nurse.   Will hold PT at this time until results are back (and pt is medically appropriate to participate).  Leitha Bleak, PT 08/07/18, 2:12 PM [phone]

## 2018-08-07 NOTE — Lactation Note (Signed)
Lactation Consultation Note  Patient Name: Megan Bennett Date: 08/07/2018    I asked pt if she had pumped her breasts today and she stated she hadn't, offered to assist but pt stated she wasn't going to pump as she was nauseated and her IV was hurting her, her nurse was made aware and IV was d/c'd per Baltazar Najjar, pt appears distressed and is going to be having testing this afternoon.      Maternal Data    Feeding    LATCH Score                   Interventions    Lactation Tools Discussed/Used     Consult Status      [name] 08/07/2018, 2:01 PM

## 2018-08-07 NOTE — Progress Notes (Signed)
Patient rested throughout shift. Complaints of nausea, fatigue, weakness, and dizziness. Patient vital signs stable. Patient weaned to 0.5 L oxygen with adequate saturations. Low grade fever, patient refusing tylenol and motrin due to nausea at this time. IV fluids to [facility] per Dr. Leonides Schanz. Poor oral intake. Urine dark/amber. Output adequate, incontinent at times. Patient had small bowel movement and burping frequently. Incisions/port sites dry and intact. Patient requesting assistance for all activities at this time. Will continue to monitor.

## 2018-08-07 NOTE — Progress Notes (Signed)
PT Cancellation Note  Patient Details Name: Megan Bennett MRN: [id number] DOB: [birth year]   Cancelled Treatment:    Reason Eval/Treat Not Completed: Patient declined, no reason specified.  Imaging results back.  Nurse spoke with MD Ward and MD cleared pt for participation in PT.  Upon PT arrival, pt reporting feeling too nauseas to participate; nurse present and preparing to give pt nausea meds.  Will re-attempt PT evaluation.  Leitha Bleak, PT 08/07/18, 4:35 PM [phone]

## 2018-08-07 NOTE — Progress Notes (Addendum)
Pt returned to room via bed by orderly at 1545 on 08/07/18.

## 2018-08-07 NOTE — Progress Notes (Addendum)
[facility] Day(s): 3.   Post op day(s): 3 Days Post-Op.   Interval History: Patient seen and examined, no acute events or new complaints overnight. Patient reports she continues to have incisional soreness. She noted that she is passing flatus and having bowel movements but she is reporting nausea/emesis with eating. Is up sitting in the chair but still requiring assistance with all activities.   Review of Systems:  Constitutional: denies fever, chills  HEENT: + Cough Gastrointestinal: + abdominal pain (Incisional), + N/V, denied diarrhea/and bowel function as per interval history Integumentary: denies any other rashes or skin discolorations except surgical incisions  Vital signs in last 24 hours: [min-max] current  Temp:  [98.3 F (36.8 C)-99.8 F (37.7 C)] 98.7 F (37.1 C) (02/28 0826) Pulse Rate:  [70-85] 78 (02/28 0957) Resp:  [18-20] 18 (02/28 0826) BP: (121-148)/(68-89) 131/85 (02/28 0957) SpO2:  [93 %-98 %] 95 % (02/28 0826)     Height: 5\' 8"  (172.7 cm) Weight: 100 kg BMI (Calculated): 33.52   Intake/Output this shift:  Total I/O In: 192.1 [IV Piggyback:192.1] Out: 251 [Urine:250; Stool:1]     Physical Exam:  Constitutional: alert, cooperative and no distress  Respiratory:Taking short shallow breath, CTAB, on 3L [place] Cardiovascular: regular rate and sinus rhythm  Gastrointestinal: soft,incisional tenderness, and non-distended. No rebound/guarding Integumentary:Midline mini-laparotomy incision is CDI, there is surrounding ecchymosis, no erythema or drainage. Laparoscopic incisions are CDI, no erythema or drainage.  Labs:  CBC Latest Ref Rng & Units 08/06/2018 08/05/2018 08/04/2018  WBC 4.0 - 10.5 K/uL 12.8(H) 19.4(H) 17.9(H)  Hemoglobin 12.0 - 15.0 g/dL 11.5(L) 12.7 16.2(H)  Hematocrit 36.0 - 46.0 % 36.7 38.3 47.7(H)  Platelets 150 - 400 K/uL 153 246 330   CMP Latest Ref Rng & Units 08/06/2018 08/05/2018 08/04/2018   Glucose 70 - 99 mg/dL 101(H) 177(H) 330(H)  BUN 6 - 20 mg/dL 9 16 13   Creatinine 0.44 - 1.00 mg/dL 0.49 0.84 0.87  Sodium 135 - 145 mmol/L 138 139 134(L)  Potassium 3.5 - 5.1 mmol/L 3.2(L) 3.9 3.4(L)  Chloride 98 - 111 mmol/L 108 112(H) 104  CO2 22 - 32 mmol/L 24 21(L) 18(L)  Calcium 8.9 - 10.3 mg/dL 7.9(L) 7.4(L) 8.5(L)  Total Protein 6.5 - 8.1 g/dL 5.3(L) 5.1(L) 6.7  Total Bilirubin 0.3 - 1.2 mg/dL 0.5 0.3 1.2  Alkaline Phos 38 - 126 U/L 72 59 72  AST 15 - 41 U/L 14(L) 20 30  ALT 0 - 44 U/L 20 24 34     Imaging studies: No new pertinent imaging studies   Assessment/Plan:  33 y.o. female with improving but persistent post-surgical ileus 3 Days Post-Op s/p exploratory laparotomy, small bowel resection, and EEAfor perforated small bowel 4 days s/p laparoscopic tubal ligation, complicated by pertinent comorbidities includinghistory of fibromyalgia, GERD, obesity, and current tobacco abuse.   - Continue clear diet, if emesis/nausea become a problem back down   - If nausea/emesis concern remains, consider re-imaging to evaluate for possible abscess   - Continue IVF, IV ABx, pain control prn (minimize narcotics)              - Monitor leukocytosis, abdominal exam, on-going bowel function              - Encouraged ambulation, would not hesitate to engage PT             - Continue IS use (pulling 750 ccs), encouraged hourly use             -  Consider Mucinex for cough/phlegm             - Further management per primary team    - General surgery will sign off, please re-consult if new issues arise.   All of the above findings and recommendations were discussed with the patient, , and the medical team, and all of patient's questions were answered to her expressed satisfaction  -- Edison Simon, PA-C [facility] 08/07/2018, 10:53 AM [phone] M-F: 7am - 4pm   I saw and evaluated the patient.  I agree with the above documentation, exam, and plan, which I have  edited where appropriate. Fredirick Maudlin  9:43 AM

## 2018-08-07 NOTE — Progress Notes (Signed)
Dr. Leonides Schanz contacted to report that pt's husband is requesting to talk with MD and to clarify IVF orders.  Orders for LR with 20Kcl at 125/hr received.  MD states she will come in to talk with pt and sig other.

## 2018-08-07 NOTE — Progress Notes (Signed)
Pt transported to CT via bed by orderly at 1449 on 08/07/18.

## 2018-08-08 LAB — COMPREHENSIVE METABOLIC PANEL
ALT: 17 U/L (ref 0–44)
AST: 10 U/L — ABNORMAL LOW (ref 15–41)
Albumin: 2.6 g/dL — ABNORMAL LOW (ref 3.5–5.0)
Alkaline Phosphatase: 73 U/L (ref 38–126)
Anion gap: 9 (ref 5–15)
BUN: 11 mg/dL (ref 6–20)
CHLORIDE: 106 mmol/L (ref 98–111)
CO2: 22 mmol/L (ref 22–32)
Calcium: 7.8 mg/dL — ABNORMAL LOW (ref 8.9–10.3)
Creatinine, Ser: 0.45 mg/dL (ref 0.44–1.00)
GFR calc Af Amer: >60 mL/min (ref >60)
GFR calc non Af Amer: >60 mL/min (ref >60)
Glucose, Bld: 116 mg/dL — ABNORMAL HIGH (ref 70–99)
Potassium: 3.3 mmol/L — ABNORMAL LOW (ref 3.5–5.1)
Sodium: 137 mmol/L (ref 135–145)
TOTAL PROTEIN: 5.9 g/dL — AB (ref 6.5–8.1)
Total Bilirubin: 0.8 mg/dL (ref 0.3–1.2)

## 2018-08-08 LAB — CBC
HCT: 34.9 % — ABNORMAL LOW (ref 36.0–46.0)
Hemoglobin: 11.8 g/dL — ABNORMAL LOW (ref 12.0–15.0)
MCH: 32.3 pg (ref 26.0–34.0)
MCHC: 33.8 g/dL (ref 30.0–36.0)
MCV: 95.6 fL (ref 80.0–100.0)
Platelets: 252 10*3/uL (ref 150–400)
RBC: 3.65 MIL/uL — ABNORMAL LOW (ref 3.87–5.11)
RDW: 12.8 % (ref 11.5–15.5)
WBC: 10.2 10*3/uL (ref 4.0–10.5)
nRBC: 0 % (ref 0.0–0.2)

## 2018-08-08 LAB — TSH: TSH: 1.589 u[IU]/mL (ref 0.350–4.500)

## 2018-08-08 MED ORDER — ONDANSETRON HCL 4 MG/2ML IJ SOLN
4.0000 mg | INTRAMUSCULAR | Status: DC | PRN
  Administered 2018-08-08 – 2018-08-12 (×13): 4 mg via INTRAVENOUS
  Filled 2018-08-08 (×14): qty 2

## 2018-08-08 MED ORDER — POTASSIUM CHLORIDE 2 MEQ/ML IV SOLN
INTRAVENOUS | Status: DC
  Administered 2018-08-08 – 2018-08-09 (×2): via INTRAVENOUS
  Filled 2018-08-08 (×3): qty 1000

## 2018-08-08 MED ORDER — METOCLOPRAMIDE HCL 5 MG/ML IJ SOLN
10.0000 mg | Freq: Four times a day (QID) | INTRAMUSCULAR | Status: DC | PRN
  Administered 2018-08-08 – 2018-08-11 (×7): 10 mg via INTRAVENOUS
  Filled 2018-08-08 (×7): qty 2

## 2018-08-08 MED ORDER — KCL-LACTATED RINGERS 20 MEQ/L IV SOLN: INTRAVENOUS | Status: DC

## 2018-08-08 NOTE — Progress Notes (Signed)
Obstetric and Gynecology  POD # 4  Subjective  Patient states "I am so tired and hurt", declining PO pain meds, ambulating with assistance to bathroom only, voiding spontaneously. Passing flatus minimally, belching frequently. Occasionally coughing up small amts of sputum and emesis.       Objective  Objective:   Vitals:   08/08/18 0047 08/08/18 0100 08/08/18 0450 08/08/18 0757  BP: 124/76  128/83 129/79  Pulse: (!) 124 64 (!) 57 (!) 52  Resp: 18  20 18   Temp: 99.4 F (37.4 C)  99.4 F (37.4 C)   TempSrc: Oral  Oral   SpO2: 94%  94% 95%  Weight:      Height:       Temp:  [97.7 F (36.5 C)-99.4 F (37.4 C)] 99.4 F (37.4 C) (02/29 0450) Pulse Rate:  [52-124] 52 (02/29 0757) Resp:  [18-20] 18 (02/29 0757) BP: (124-131)/(76-89) 129/79 (02/29 0757) SpO2:  [94 %-96 %] 95 % (02/29 0757) I/O last 3 completed shifts: In: 1536.2 [I.V.:929.8; IV Piggyback:606.4] Out: 1376 [Urine:1275; Emesis/NG output:100; Stool:1] Total I/O In: -  Out: 500 [Urine:500]  Intake/Output Summary (Last 24 hours) at 08/08/2018 1007 Last data filed at 08/08/2018 0929 Gross per 24 hour  Intake 1344.09 ml  Output 1325 ml  Net 19.09 ml     Current Vital Signs 24h Vital Sign Ranges  T 99.4 F (37.4 C) Temp  Avg: 98.6 F (37 C)  Min: 97.7 F (36.5 C)  Max: 99.4 F (37.4 C)  BP 129/79 BP  Min: 124/76  Max: 131/80  HR (!) 52 Pulse  Avg: 68.7  Min: 52  Max: 124  RR 18 Resp  Avg: 18.3  Min: 18  Max: 20  SaO2 95 % Room Air SpO2  Avg: 94.8 %  Min: 94 %  Max: 96 %           24 Hour I/O Current Shift I/O  Time Ins Outs 02/28 0701 - 02/29 0700 In: 1536.2 [I.V.:929.8] Out: 5993 [Urine:975] 02/29 0701 - 02/29 1900 In: -  Out: 500 [Urine:500]   General: NAD Cardiovascular: RRR, no murmurs Pulmonary: CTAB, normal respiratory effort Abdomen: Benign. Non-tender, +BS x 4, hypoactive but present.   Incision: dermabond over multiple sites clean and intact.  Extremities: No erythema or cords, no calf  tenderness, with normal peripheral pulses.  Labs: No results found for this or any previous visit (from the past 24 hour(s)).  Cultures: Results for orders placed or performed during the hospital encounter of 08/04/18  Body fluid culture     Status: None   Collection Time: 08/04/18  3:11 PM  Result Value Ref Range Status   Specimen Description   Final    FLUID Performed at [facility], [address] [place], [place] [address]    Special Requests   Final    FLUID Performed at [facility], [address] [place], [place] [address]    Gram Stain   Final    ABUNDANT WBC PRESENT, PREDOMINANTLY PMN NO ORGANISMS SEEN Performed at [facility], [place] [address], [place], [place] [address]    Culture NO GROWTH  Final   Report Status 08/07/2018 FINAL  Final    Imaging: Ct Angio Chest Pe W Or Wo Contrast  Result Date: 08/07/2018 CLINICAL DATA:  33 y.o. female with improving but persistent post-surgical ileus 3 Days Post-Op s/p exploratory laparotomy, small bowel resection, and EEA for perforated small bowel 4 days s/p laparoscopic tubal ligation,  complicated by pertinent comorbidities including history of fibromyalgia, GERD, obesity, and current tobacco abuse. EXAM: CT ANGIOGRAPHY CHEST CT ABDOMEN AND PELVIS WITH CONTRAST TECHNIQUE: Multidetector CT imaging of the chest was performed using the standard protocol during bolus administration of intravenous contrast. Multiplanar CT image reconstructions and MIPs were obtained to evaluate the vascular anatomy. Multidetector CT imaging of the abdomen and pelvis was performed using the standard protocol during bolus administration of intravenous contrast. CONTRAST:  124mL ISOVUE-370 IOPAMIDOL (ISOVUE-370) INJECTION 76% COMPARISON:  Chest radiograph, 08/05/2018 FINDINGS: CTA CHEST FINDINGS Cardiovascular: There is satisfactory opacification of the pulmonary arteries to the segmental level. There is no evidence of a pulmonary  embolism. Heart is top-normal in size. No pericardial effusion. No coronary artery calcifications. Great vessels are within normal limits. No aortic dissection. Mediastinum/Nodes: No enlarged mediastinal, hilar, or axillary lymph nodes. Thyroid gland, trachea, and esophagus demonstrate no significant findings. Lungs/Pleura: Small right and minimal left pleural effusions. Dependent atelectasis is noted in the lower lobes, right greater than left, and base of the right middle lobe and minimally along the posterior inferior left upper lobe lingula. Remainder of the lungs is clear. No pneumothorax. Musculoskeletal: No fracture or acute finding. No osteoblastic or osteolytic lesions. Review of the MIP images confirms the above findings. CT ABDOMEN and PELVIS FINDINGS Hepatobiliary: Liver is unremarkable. Normal gallbladder. No bile duct dilation. Pancreas: Homogeneous enhancement. There is fluid attenuation bordering on the pancreatic head, which appears centered primarily on the duodenum. No convincing pancreatic inflammation. No masses. Spleen: Normal in size without focal abnormality. Adrenals/Urinary Tract: Adrenal glands are unremarkable. Kidneys are normal, without renal calculi, focal lesion, or hydronephrosis. Bladder is unremarkable. Stomach/Bowel: Stomach is mildly distended. No wall thickening or inflammation. Mild wall thickening of the first and second portions of the duodenum with adjacent inflammation/fluid attenuation, but also tracks along the gastrohepatic ligament, lies adjacent to the pancreatic head and extends to the anterior pararenal fascia. There is mild dilation most evident of the mid small bowel, maximum diameter 3.2 cm. There are associated air-fluid levels. There is no wall thickening. A small bowel anastomosis is noted in the right mid abdomen. Right colon is partly fluid-filled. There is no dilation or wall thickening. Left colon is decompressed. Normal appendix is visualized. There is a  trace amount of ascites that primarily collects over the liver, tracks along the porta hepatis adjacent to the gallbladder and duodenum and right pericolic gutter. There is a minimal amount of extraluminal air in the anterior lower peritoneal cavity, left lower quadrant. Vascular/Lymphatic: No significant vascular findings are present. No enlarged abdominal or pelvic lymph nodes. Reproductive: Uterus and bilateral adnexa are unremarkable. Other: Small amount of low central anterior abdominal wall subcutaneous air. Subcutaneous edema noted along the posterior lower abdomen and pelvis and flanks. Musculoskeletal: No acute or significant osseous findings. Review of the MIP images confirms the above findings. IMPRESSION: CTA CHEST 1. No evidence of a pulmonary embolism. 2. Small right and minimal left pleural effusions. 3. Significant dependent atelectasis in the lower lobes, right greater than left. No convincing pneumonia. No pulmonary edema. ABDOMEN AND PELVIS CT 1. Mild central small bowel dilation with small bowel air-fluid levels, without a discrete transition point. This is consistent with an adynamic ileus. 2. Evidence of wall thickening of the first and second portions of the duodenum, which suggest duodenitis, with fluid attenuation/inflammation in the right upper quadrant. 3. No small bowel or colonic wall thickening to suggest bowel ischemia. 4. Minimal postoperative free intraperitoneal air. Trace  amount of ascites. Electronically Signed   By: Lajean Manes M.D.   On: 08/07/2018 15:40   Ct Abdomen Pelvis W Contrast  Result Date: 08/07/2018 CLINICAL DATA:  33 y.o. female with improving but persistent post-surgical ileus 3 Days Post-Op s/p exploratory laparotomy, small bowel resection, and EEA for perforated small bowel 4 days s/p laparoscopic tubal ligation, complicated by pertinent comorbidities including history of fibromyalgia, GERD, obesity, and current tobacco abuse. EXAM: CT ANGIOGRAPHY CHEST CT  ABDOMEN AND PELVIS WITH CONTRAST TECHNIQUE: Multidetector CT imaging of the chest was performed using the standard protocol during bolus administration of intravenous contrast. Multiplanar CT image reconstructions and MIPs were obtained to evaluate the vascular anatomy. Multidetector CT imaging of the abdomen and pelvis was performed using the standard protocol during bolus administration of intravenous contrast. CONTRAST:  168mL ISOVUE-370 IOPAMIDOL (ISOVUE-370) INJECTION 76% COMPARISON:  Chest radiograph, 08/05/2018 FINDINGS: CTA CHEST FINDINGS Cardiovascular: There is satisfactory opacification of the pulmonary arteries to the segmental level. There is no evidence of a pulmonary embolism. Heart is top-normal in size. No pericardial effusion. No coronary artery calcifications. Great vessels are within normal limits. No aortic dissection. Mediastinum/Nodes: No enlarged mediastinal, hilar, or axillary lymph nodes. Thyroid gland, trachea, and esophagus demonstrate no significant findings. Lungs/Pleura: Small right and minimal left pleural effusions. Dependent atelectasis is noted in the lower lobes, right greater than left, and base of the right middle lobe and minimally along the posterior inferior left upper lobe lingula. Remainder of the lungs is clear. No pneumothorax. Musculoskeletal: No fracture or acute finding. No osteoblastic or osteolytic lesions. Review of the MIP images confirms the above findings. CT ABDOMEN and PELVIS FINDINGS Hepatobiliary: Liver is unremarkable. Normal gallbladder. No bile duct dilation. Pancreas: Homogeneous enhancement. There is fluid attenuation bordering on the pancreatic head, which appears centered primarily on the duodenum. No convincing pancreatic inflammation. No masses. Spleen: Normal in size without focal abnormality. Adrenals/Urinary Tract: Adrenal glands are unremarkable. Kidneys are normal, without renal calculi, focal lesion, or hydronephrosis. Bladder is unremarkable.  Stomach/Bowel: Stomach is mildly distended. No wall thickening or inflammation. Mild wall thickening of the first and second portions of the duodenum with adjacent inflammation/fluid attenuation, but also tracks along the gastrohepatic ligament, lies adjacent to the pancreatic head and extends to the anterior pararenal fascia. There is mild dilation most evident of the mid small bowel, maximum diameter 3.2 cm. There are associated air-fluid levels. There is no wall thickening. A small bowel anastomosis is noted in the right mid abdomen. Right colon is partly fluid-filled. There is no dilation or wall thickening. Left colon is decompressed. Normal appendix is visualized. There is a trace amount of ascites that primarily collects over the liver, tracks along the porta hepatis adjacent to the gallbladder and duodenum and right pericolic gutter. There is a minimal amount of extraluminal air in the anterior lower peritoneal cavity, left lower quadrant. Vascular/Lymphatic: No significant vascular findings are present. No enlarged abdominal or pelvic lymph nodes. Reproductive: Uterus and bilateral adnexa are unremarkable. Other: Small amount of low central anterior abdominal wall subcutaneous air. Subcutaneous edema noted along the posterior lower abdomen and pelvis and flanks. Musculoskeletal: No acute or significant osseous findings. Review of the MIP images confirms the above findings. IMPRESSION: CTA CHEST 1. No evidence of a pulmonary embolism. 2. Small right and minimal left pleural effusions. 3. Significant dependent atelectasis in the lower lobes, right greater than left. No convincing pneumonia. No pulmonary edema. ABDOMEN AND PELVIS CT 1. Mild central small bowel dilation  with small bowel air-fluid levels, without a discrete transition point. This is consistent with an adynamic ileus. 2. Evidence of wall thickening of the first and second portions of the duodenum, which suggest duodenitis, with fluid  attenuation/inflammation in the right upper quadrant. 3. No small bowel or colonic wall thickening to suggest bowel ischemia. 4. Minimal postoperative free intraperitoneal air. Trace amount of ascites. Electronically Signed   By: Lajean Manes M.D.   On: 08/07/2018 15:40   Dg Chest Port 1 View  Result Date: 08/05/2018 CLINICAL DATA:  Initial evaluation for acute shortness of breath. EXAM: PORTABLE CHEST 1 VIEW COMPARISON:  Prior radiograph from 08/04/2018. FINDINGS: Cardiac and mediastinal silhouette stable in size and contour, and remain within normal limits. Lungs hypoinflated. Veiling opacity overlying the right hemidiaphragm compatible with layering pleural effusion. Probable small left pleural effusion present as well. Mild diffuse pulmonary vascular congestion without frank pulmonary edema. Superimposed bibasilar opacities favored to reflect atelectasis, although infiltrate could be considered in the correct clinical setting. No pneumothorax. Osseous structures unchanged. Probable small amount of residual free intraperitoneal air suspected, although decreased and not as well visualized as on prior exam. IMPRESSION: 1. Low lung volumes with small to moderate layering right pleural effusion, with additional small left effusion. Associated bibasilar opacities favored to reflect atelectasis, although superimposed infiltrate could be considered in the correct clinical setting, particularly at the right lung base. 2. Underlying mild pulmonary vascular congestion without overt pulmonary edema. Electronically Signed   By: Jeannine Boga M.D.   On: 08/05/2018 17:04   Dg Chest Portable 1 View  Result Date: 08/04/2018 CLINICAL DATA:  Tubal ligation yesterday. Vomiting. Pain. Shortness of breath. EXAM: PORTABLE CHEST 1 VIEW COMPARISON:  No prior. FINDINGS: Mediastinum and hilar structures normal. Bibasilar atelectasis. Free intraperitoneal air consistent with recent surgery. Clinical correlation suggested.  Degenerative change thoracic spine. IMPRESSION: 1.  Bibasilar atelectasis. 2. Free intraperitoneal air consistent with recent surgery. Clinical correlation suggested. Electronically Signed   By: Marcello Moores  Register   On: 08/04/2018 12:38     Assessment   33 y.o. [facility] Day: 5   Plan   1. D/W Dr Leonides Schanz, encouraged ambulation today at least TID. Continue cough and deep breathe and use of incentive spirometer.  2. Continue NPO.   Francetta Found, CNM 08/08/2018 10:10 AM

## 2018-08-08 NOTE — Progress Notes (Signed)
Patient encouraged to ambulate. Ambulated in hall for 5 minutes with NT (1 lap around nurses station). Patient used 4 point walker. Tolerated well. Incentive spirometer encouraged. Patient goal 1500. Achieved 1000. RN to continue to monitor. Nasal cannula discontinued.

## 2018-08-08 NOTE — Progress Notes (Addendum)
Pt in bed no needs or complaints at this time. Noted gatorade at bedside table. Asked pt if she was drinking the gatorade and she stated that she had taken a sip. Instructed patient that she was npo except ice chips. Pt stated that Dr. Leonides Schanz said that she could have some. Instructed patient that at this time she was not to have anything by mouth except ice chips. Pt stated that she would not drink anymore. Nurse threw cup with gatorade away. Pt had full cup of ice at bedside. No other needs or concerns expressed. Will cont to monitor

## 2018-08-08 NOTE — Plan of Care (Signed)
Patient's vital signs stable except Temp Max 99.4 orally and O2 at 0.5 L/[place]; no c/o pain; denied tylenol and ibuprofen; IV fluids continued; IV site clear; voiding; stooled; NPO except ice chips; patient up to bathroom with walker and nurse assist; zofran IV given for nausea and vomitting.

## 2018-08-08 NOTE — Progress Notes (Signed)
Patient ambulated in hall with NT. Tolerated well. 4-point walker used. Patient states she doesn't feel that she can walk around without it. Gait steady. Patient still having nausea. Patient taking Zofran and Reglan, and states "the combination helped." States she's passing "little bit" of flatus. Voiding adequately. Stool x3 today. Continued productive cough with sputum. No vomiting this shift. Incentive spirometer continuously encouraged. Patient reached 1000 again.  Patient requesting lots of help with ADLs. Patient states she "cannot wipe herself" after stooling/voiding. Needs help in/out of bed, pulling up covers, handing her personal belongings, holding pump flanges for her, etc. Patient states she "feels too weak and has no energy." Provider aware.   RN to continue to monitor.

## 2018-08-08 NOTE — Progress Notes (Addendum)
Patient complaining of pain 4/10 in mid abdomen. Patient offered but refuses pain medicine, stating "it's going to tear my stomach up." Patient complains of nausea but no vomiting this morning. Zofran given at 0730 by night shift RN. Patient requesting "something else" for nausea. CNM, R. McVey, notified while she was rounding on patient. Bowel sounds hypoactive in all four quadrants. Patient passing minimal flatus but burping a lot. Patient had a bowel movement this morning. Voiding adequately. Patient has been refusing incentive spirometer per night shift report. RN to continue to encourage. Patient breath sounds WDL. Patient having productive coughs, with sputum. On .5L nasal cannula. O2 95%. RN to reassess respiratory status around lunch time. Pulse rate 52. All other vitals stable.   RN spoke with patient about pumping. Patient states she wants to continue to breastfeed 32 week old infant but refuses to pump, stating "No, I'm dehydrated." RN explained that although patient is NPO, she is still receiving IV fluids and encouraged patient to still pump, even if she doesn't feel she's getting a lot. RN discussed her body's need for stimulation to help with milk supply. Patient verbalizes understanding but states "I'm not going to do that right now."  RN to continue to monitor.

## 2018-08-09 ENCOUNTER — Inpatient Hospital Stay: Payer: Medicaid Other

## 2018-08-09 ENCOUNTER — Inpatient Hospital Stay (HOSPITAL_COMMUNITY)
Admit: 2018-08-09 | Discharge: 2018-08-09 | Disposition: A | Discharge status: Home / Self Care | Payer: Medicaid Other | Attending: Internal Medicine | Admitting: Internal Medicine

## 2018-08-09 DIAGNOSIS — R0609 Other forms of dyspnea: Secondary | ICD-10-CM

## 2018-08-09 LAB — COMPREHENSIVE METABOLIC PANEL
ALT: 18 U/L (ref 0–44)
ALT: 20 U/L (ref 0–44)
AST: 15 U/L (ref 15–41)
AST: 20 U/L (ref 15–41)
Albumin: 2.4 g/dL — ABNORMAL LOW (ref 3.5–5.0)
Albumin: 2.8 g/dL — ABNORMAL LOW (ref 3.5–5.0)
Alkaline Phosphatase: 66 U/L (ref 38–126)
Alkaline Phosphatase: 70 U/L (ref 38–126)
Anion gap: 8 (ref 5–15)
Anion gap: 8 (ref 5–15)
BUN: 12 mg/dL (ref 6–20)
BUN: 8 mg/dL (ref 6–20)
CO2: 21 mmol/L — ABNORMAL LOW (ref 22–32)
CO2: 23 mmol/L (ref 22–32)
Calcium: 7.7 mg/dL — ABNORMAL LOW (ref 8.9–10.3)
Calcium: 7.9 mg/dL — ABNORMAL LOW (ref 8.9–10.3)
Chloride: 105 mmol/L (ref 98–111)
Chloride: 103 mmol/L (ref 98–111)
Creatinine, Ser: 0.48 mg/dL (ref 0.44–1.00)
Creatinine, Ser: 0.53 mg/dL (ref 0.44–1.00)
GFR calc Af Amer: >60 mL/min (ref >60)
GFR calc Af Amer: >60 mL/min (ref >60)
GFR calc non Af Amer: >60 mL/min (ref >60)
GFR calc non Af Amer: >60 mL/min (ref >60)
Glucose, Bld: 118 mg/dL — ABNORMAL HIGH (ref 70–99)
Glucose, Bld: 151 mg/dL — ABNORMAL HIGH (ref 70–99)
Potassium: 3.2 mmol/L — ABNORMAL LOW (ref 3.5–5.1)
Potassium: 3.3 mmol/L — ABNORMAL LOW (ref 3.5–5.1)
Sodium: 134 mmol/L — ABNORMAL LOW (ref 135–145)
Sodium: 134 mmol/L — ABNORMAL LOW (ref 135–145)
TOTAL PROTEIN: 6 g/dL — AB (ref 6.5–8.1)
Total Bilirubin: 0.8 mg/dL (ref 0.3–1.2)
Total Bilirubin: 0.4 mg/dL (ref 0.3–1.2)
Total Protein: 5.6 g/dL — ABNORMAL LOW (ref 6.5–8.1)

## 2018-08-09 LAB — CBC WITH DIFFERENTIAL/PLATELET
ABS IMMATURE GRANULOCYTES: 0.23 10*3/uL — AB (ref 0.00–0.07)
Basophils Absolute: 0 10*3/uL (ref 0.0–0.1)
Basophils Relative: 0 %
Eosinophils Absolute: 0.1 10*3/uL (ref 0.0–0.5)
Eosinophils Relative: 1 %
HCT: 32.9 % — ABNORMAL LOW (ref 36.0–46.0)
Hemoglobin: 11.2 g/dL — ABNORMAL LOW (ref 12.0–15.0)
Immature Granulocytes: 2 %
Lymphocytes Relative: 19 %
Lymphs Abs: 1.9 10*3/uL (ref 0.7–4.0)
MCH: 32.5 pg (ref 26.0–34.0)
MCHC: 34 g/dL (ref 30.0–36.0)
MCV: 95.4 fL (ref 80.0–100.0)
Monocytes Absolute: 1.2 10*3/uL — ABNORMAL HIGH (ref 0.1–1.0)
Monocytes Relative: 12 %
NEUTROS PCT: 66 %
Neutro Abs: 6.2 10*3/uL (ref 1.7–7.7)
PLATELETS: 256 10*3/uL (ref 150–400)
RBC: 3.45 MIL/uL — ABNORMAL LOW (ref 3.87–5.11)
RDW: 13 % (ref 11.5–15.5)
WBC: 9.6 10*3/uL (ref 4.0–10.5)
nRBC: 0 % (ref 0.0–0.2)

## 2018-08-09 LAB — EXPECTORATED SPUTUM ASSESSMENT W REFEX TO RESP CULTURE

## 2018-08-09 LAB — TROPONIN I
Troponin I: <0.03 ng/mL (ref <0.03)
Troponin I: <0.03 ng/mL (ref <0.03)
Troponin I: <0.03 ng/mL (ref <0.03)

## 2018-08-09 LAB — RESPIRATORY PANEL BY PCR

## 2018-08-09 LAB — VITAMIN B12: Vitamin B-12: 1444 pg/mL — ABNORMAL HIGH (ref 180–914)

## 2018-08-09 LAB — URINALYSIS, ROUTINE W REFLEX MICROSCOPIC
BACTERIA UA: NONE SEEN
Bilirubin Urine: NEGATIVE
Glucose, UA: NEGATIVE mg/dL
Ketones, ur: 20 mg/dL — AB
Leukocytes,Ua: NEGATIVE
Nitrite: NEGATIVE
Protein, ur: NEGATIVE mg/dL
SPECIFIC GRAVITY, URINE: 1.01 (ref 1.005–1.030)
pH: 6 (ref 5.0–8.0)

## 2018-08-09 LAB — ECHOCARDIOGRAM COMPLETE
Height: 68 in
Weight: 3525.98 oz

## 2018-08-09 LAB — MAGNESIUM: Magnesium: 1.8 mg/dL (ref 1.7–2.4)

## 2018-08-09 LAB — PHOSPHORUS: Phosphorus: 2.5 mg/dL (ref 2.5–4.6)

## 2018-08-09 LAB — INFLUENZA PANEL BY PCR (TYPE A & B)
INFLBPCR: NEGATIVE
Influenza A By PCR: NEGATIVE

## 2018-08-09 LAB — PROCALCITONIN: Procalcitonin: 0.15 ng/mL

## 2018-08-09 LAB — EXPECTORATED SPUTUM ASSESSMENT W GRAM STAIN, RFLX TO RESP C

## 2018-08-09 LAB — PREALBUMIN: Prealbumin: 12.3 mg/dL — ABNORMAL LOW (ref 18–38)

## 2018-08-09 LAB — STREP PNEUMONIAE URINARY ANTIGEN: STREP PNEUMO URINARY ANTIGEN: NEGATIVE

## 2018-08-09 LAB — FOLATE: Folate: 21.5 ng/mL (ref >5.9)

## 2018-08-09 LAB — PROTEIN, URINE, RANDOM: TOTAL PROTEIN, URINE: 11 mg/dL

## 2018-08-09 LAB — BRAIN NATRIURETIC PEPTIDE: B Natriuretic Peptide: 212 pg/mL — ABNORMAL HIGH (ref 0.0–100.0)

## 2018-08-09 MED ORDER — FUROSEMIDE 10 MG/ML IJ SOLN
40.0000 mg | Freq: Once | INTRAMUSCULAR | Status: AC
  Administered 2018-08-09: 40 mg via INTRAVENOUS
  Filled 2018-08-09: qty 4

## 2018-08-09 MED ORDER — BREAST MILK/FORMULA (FOR LABEL PRINTING ONLY): ORAL | Status: DC

## 2018-08-09 MED ORDER — POTASSIUM CHLORIDE 20 MEQ PO PACK
40.0000 meq | PACK | Freq: Once | ORAL | Status: AC
  Administered 2018-08-09: 40 meq via ORAL
  Filled 2018-08-09: qty 2

## 2018-08-09 MED ORDER — ASPIRIN 81 MG PO CHEW
81.0000 mg | CHEWABLE_TABLET | Freq: Every day | ORAL | Status: DC
  Administered 2018-08-09 – 2018-08-12 (×4): 81 mg via ORAL
  Filled 2018-08-09 (×5): qty 1

## 2018-08-09 MED ORDER — POTASSIUM CHLORIDE 10 MEQ/100ML IV SOLN
10.0000 meq | INTRAVENOUS | Status: AC
  Administered 2018-08-09 (×4): 10 meq via INTRAVENOUS
  Filled 2018-08-09 (×4): qty 100

## 2018-08-09 MED ORDER — SIMETHICONE 80 MG PO CHEW
160.0000 mg | CHEWABLE_TABLET | Freq: Four times a day (QID) | ORAL | Status: DC | PRN
  Administered 2018-08-09 – 2018-08-12 (×8): 160 mg via ORAL
  Filled 2018-08-09 (×10): qty 2

## 2018-08-09 MED ORDER — POTASSIUM CHLORIDE 20 MEQ PO PACK
40.0000 meq | PACK | Freq: Two times a day (BID) | ORAL | Status: DC
  Administered 2018-08-09: 40 meq via ORAL
  Filled 2018-08-09 (×2): qty 2

## 2018-08-09 MED ORDER — POTASSIUM CHLORIDE CRYS ER 20 MEQ PO TBCR
40.0000 meq | EXTENDED_RELEASE_TABLET | Freq: Two times a day (BID) | ORAL | Status: DC
  Administered 2018-08-09 – 2018-08-10 (×4): 40 meq via ORAL
  Filled 2018-08-09 (×5): qty 2

## 2018-08-09 MED ORDER — HYDRALAZINE HCL 20 MG/ML IJ SOLN: 10.0000 mg | Freq: Four times a day (QID) | INTRAMUSCULAR | Status: DC | PRN

## 2018-08-09 MED ORDER — HYDRALAZINE HCL 20 MG/ML IJ SOLN
10.0000 mg | Freq: Once | INTRAMUSCULAR | Status: AC
  Administered 2018-08-09: 10 mg via INTRAVENOUS

## 2018-08-09 NOTE — Progress Notes (Addendum)
Summary of events overnight until now:  Was notified by overnight nurse that her pulse dropped into the 40s, and patient complained of difficulty breathing.  O2 had been turned off earlier in the day.  Reviewed vital signs, medications, and ordered O2 back on, chest x-ray, and EKG. Also spoke to hospitalist to discuss other causes of bradycardia, and to review EKG.  Additions today were reglan and IV potassium.   Without identifying any overt causes, I asked him to see her and give his impression. I ordered repeat labs, and discontinued the antibiotics.    He saw her and ordered more labs, and for the first time since her admission heard crackles on her lungs; administered lasix.  She is diuresing currently. She has had subsequent hypertension likely due to intravascular depletion following the lasix.    This morning on rounds she expressed feeling hungry for the first time since her surgery.   She is more awake and alert, and has better color than throughout her stay.  She continues to have bowel movements and flatus.    BP 139/87 (BP Location: Right Arm)   Pulse (!) 56 Comment: nurse Palma Holter notified  Temp 98.9 F (37.2 C) (Oral)   Resp 18   Ht 5\' 8"  (1.727 m)   Wt 100 kg   SpO2 100%   BMI 33.51 kg/m    A/P: 33yo, HD 6 POD 5 from dx lap, ex lap with small bowel resection for trochar enterotomy, and postop ileus.  1. Ileus is resolving, may have liquids. 2. Replete electrolytes, and f/u repeat labs 3. Awaiting echocardiogram 4. Ambulation in hallways 5. Asymptomatic bradycardia - unknown etiology as of now; fluid overload possible.  Continue diuresis and control BPs as needed.  Her baseline SBP 140 6. Continue inpatient admission   ----- Larey Days, MD Attending Obstetrician and Gynecologist [facility], Department of [facility]

## 2018-08-09 NOTE — Lactation Note (Signed)
Lactation Consultation Note  Patient Name: Megan Bennett Date: 08/09/2018   Finally convinced mom to pump.  Have been offering to help her pump all day.  Mom had excuses of IV Potassium was hurting too much and the next 2 times she was just too exhausted.  Told mom I would help her hold flanges in place, so she agreed to pump.  Mom pumped 20 ml.  Reviewed supply and demand and need to frequently pump to maintain milk supply.  Reviewed pumping, collection, labeling, storage and handling of breast milk.    Maternal Data    Feeding    LATCH Score                   Interventions    Lactation Tools Discussed/Used     Consult Status      Jarold Motto 08/09/2018, 5:10 PM

## 2018-08-09 NOTE — Progress Notes (Signed)
Medical consult was done this morning for bradycardia.  Patient is admitted to GYN service because of postpartum tubal ligation, small bowel enterotomy with small bowel resection, reanastomosis patient presently n.p.o., getting IV fluids.  Has nausea.  We are consulted because of bradycardia patient heart rate is around 55 bpm, EKG showed no high degree AV block.  It showed normal sinus rhythm with T wave inversions lateral leads which are nonspecific.  Echocardiogram is pending.  #1 sinus bradycardia asymptomatic.  Patient had some dizziness last night with heart rate being low in 40s, but now patient's heart rate is around, patient has chronic nausea.  So continue IV nausea medicines, limit the use of narcotics as it can cause bradycardia.  Follow echocardiogram.  Patient if has persistent bradycardia, hypotension patient needs cardiology evaluation but at this time I do not think she needs it. 2.  Malignant essential hypertension, patient received IV Lasix last night, will use hydralazine 10 mg IV every 4 hours as needed for SBP more than 170/80, discussed with patient's nurse Magda Paganini. 3.  Hypokalemia: Replace the potassium. 4.  Slightly elevated BNP, check echocardiogram. Time spent 15 to 20 minutes.

## 2018-08-09 NOTE — Progress Notes (Signed)
Hospitalist up to see pt

## 2018-08-09 NOTE — Progress Notes (Signed)
Pt's BP elevated. 176/82.  Paged Dr. Vianne Bulls to notify. Plan to recheck in 15 mins.  08:18 AM-  Spoke with MD. Reviewed case, updated on vital signs. Order placed for one time dose Hydralizine 10mg  IV. Continue to monitor.

## 2018-08-09 NOTE — Progress Notes (Signed)
*  PRELIMINARY RESULTS* Echocardiogram 2D Echocardiogram has been performed.  Megan Bennett 08/09/2018, 3:19 PM

## 2018-08-09 NOTE — Progress Notes (Signed)
Pt with much improvement in mood and mobility this shift, ambulates to BR with supervision, minimal assistance with ADL's. Clear liquid diet tolerated, pt with good appetite and PO intake. Nursing staff provided encouragement, emotional support throughout the shift. Family visited, updated on pt status.

## 2018-08-09 NOTE — Consult Note (Addendum)
[facility] [place] NAME: Megan Bennett    MR#:  [id number]  DATE OF BIRTH:  Oct 07, 1985  DATE OF ADMISSION:  08/04/2018  PRIMARY CARE PHYSICIAN: Patient, No Pcp Per   REQUESTING/REFERRING PHYSICIAN: Ward, Honor Loh, MD  CHIEF COMPLAINT:  No chief complaint on file.  HISTORY OF PRESENT ILLNESS:  Megan Bennett  is a 33 y.o. female with a known history of GERD, admitted to Ob/Gyn s/p postpartum tubal ligation, complicated by small bowel enterotomy (s/p small bowel resection + reanastomosis) and ileus. Ob/Gyn consult to Hospitalist service for bradycardia, SOB. HR 40s-50s. Pt states she feels miserable. She is taking rapid shallow breaths at the time of my assessment. I suspect she is splinting. She is non-hypoxic. She exhibits tachypnea, but is not laboring or in distress. Endorses recent URI, persistent cough x79mo, initially productive of dark brown sputum, which she states has since cleared up. States she is a smoker. Endorses pleuritic pain under L breast. Lung exam (+) diffuse fine crackles.  PAST MEDICAL HISTORY:   Past Medical History:  Diagnosis Date  . ADD (attention deficit disorder)   . Arthritis   . Fibromyalgia   . GERD (gastroesophageal reflux disease)   . Headache    MIGRAINES    PAST SURGICAL HISTORY:   Past Surgical History:  Procedure Laterality Date  . ESOPHAGOGASTRODUODENOSCOPY    . LAPAROSCOPIC TUBAL LIGATION Bilateral 08/03/2018   Procedure: LAPAROSCOPIC TUBAL LIGATION;  Surgeon: Ward, Honor Loh, MD;  Location: [facility];  Service: Gynecology;  Laterality: Bilateral;  . LAPAROSCOPY N/A 08/04/2018   Procedure: LAPAROSCOPY DIAGNOSTIC;  Surgeon: Ward, Honor Loh, MD;  Location: [facility];  Service: Gynecology;  Laterality: N/A;  . SMALL BOWEL REPAIR N/A 08/04/2018   Procedure: SMALL BOWEL REPAIR;  Surgeon: Ward, Honor Loh, MD;  Location: [facility];  Service: Gynecology;  Laterality: N/A;  . WISDOM TOOTH  EXTRACTION Bilateral    SOCIAL HISTORY:   Social History   Tobacco Use  . Smoking status: Current Every Day Smoker    Packs/day: 0.25    Years: 20.00    Pack years: 5.00    Types: Cigarettes  . Smokeless tobacco: Never Used  Substance Use Topics  . Alcohol use: Yes    Alcohol/week: 1.0 standard drinks    Types: 1 Glasses of wine per week    Comment: OCC    FAMILY HISTORY:  History reviewed. No pertinent family history.  DRUG ALLERGIES:   Allergies  Allergen Reactions  . Amoxicillin Rash    Did it involve swelling of the face/tongue/throat, SOB, or low BP? No Did it involve sudden or severe rash/hives, skin peeling, or any reaction on the inside of your mouth or nose? No Did you need to seek medical attention at a hospital or doctor's office? No When did it last happen?14 + years If all above answers are "NO", may proceed with cephalosporin use.,   . Wellbutrin [Bupropion] Other (See Comments)    shakey    REVIEW OF SYSTEMS:   Review of Systems  Constitutional: Positive for chills and malaise/fatigue. Negative for diaphoresis, fever and weight loss.  HENT: Negative for congestion, ear pain, hearing loss, nosebleeds, sinus pain, sore throat and tinnitus.   Eyes: Negative for blurred vision, double vision and photophobia.  Respiratory: Positive for cough, sputum production and shortness of breath. Negative for hemoptysis and wheezing.   Cardiovascular: Positive for chest pain. Negative for palpitations, orthopnea, claudication, leg swelling and PND.  Gastrointestinal: Negative for abdominal pain, blood in stool, constipation, diarrhea, heartburn, melena, nausea and vomiting.  Genitourinary: Negative for dysuria, frequency, hematuria and urgency.  Musculoskeletal: Negative for back pain, joint pain, myalgias and neck pain.  Skin: Negative for itching and rash.  Neurological: Positive for weakness. Negative for dizziness, tingling, tremors, sensory change, speech  change, focal weakness, seizures, loss of consciousness and headaches.  Psychiatric/Behavioral: Negative for depression and memory loss. The patient is not nervous/anxious and does not have insomnia.    MEDICATIONS AT HOME:   Prior to Admission medications   Medication Sig Start Date End Date Taking? Authorizing Provider  ibuprofen (ADVIL,MOTRIN) 600 MG tablet Take 1 tablet (600 mg total) by mouth every 6 (six) hours. 08/03/18  Yes Ward, Honor Loh, MD  oxyCODONE (ROXICODONE) 5 MG immediate release tablet Take 1 tablet (5 mg total) by mouth every 4 (four) hours as needed for moderate pain. 08/03/18 08/03/19 Yes Ward, Honor Loh, MD  Prenatal Vit-Fe Fumarate-FA (PREPLUS) 27-1 MG TABS Take 1 tablet by mouth daily. 06/11/18  Yes [provider]  acetaminophen (TYLENOL) 500 MG tablet Take 2 tablets (1,000 mg total) by mouth every 6 (six) hours as needed (for pain scale < 4). 06/09/18   McVey, Murray Hodgkins, CNM  docusate sodium (COLACE) 100 MG capsule Take 1 capsule (100 mg total) by mouth 2 (two) times daily. Patient not taking: Reported on 07/27/2018 06/09/18   McVey, Murray Hodgkins, CNM  omeprazole (PRILOSEC) 20 MG capsule Take 20 mg by mouth daily before breakfast.    [provider]      VITAL SIGNS:  Blood pressure 135/80, pulse (!) 48, temperature 98.9 F (37.2 C), temperature source Oral, resp. rate 16, height 5\' 8"  (1.727 m), weight 100 kg, SpO2 93 %, currently breastfeeding.  PHYSICAL EXAMINATION:  Physical Exam Constitutional:      General: She is not in acute distress.    Appearance: She is ill-appearing. She is not toxic-appearing or diaphoretic.     Interventions: She is not intubated. HENT:     Head: Atraumatic.     Mouth/Throat:     Pharynx: Oropharynx is clear.  Eyes:     General: No scleral icterus.    Extraocular Movements: Extraocular movements intact.     Conjunctiva/sclera: Conjunctivae normal.  Neck:     Musculoskeletal: Neck supple.  Cardiovascular:     Rate  and Rhythm: Bradycardia present.     Heart sounds: Normal heart sounds. No murmur. No friction rub. No gallop.   Pulmonary:     Effort: Tachypnea present. No bradypnea, accessory muscle usage, respiratory distress or retractions. She is not intubated.     Breath sounds: No stridor. Examination of the right-upper field reveals decreased breath sounds and rales. Examination of the left-upper field reveals decreased breath sounds and rales. Examination of the right-middle field reveals decreased breath sounds and rales. Examination of the left-middle field reveals decreased breath sounds and rales. Examination of the right-lower field reveals decreased breath sounds and rales. Examination of the left-lower field reveals decreased breath sounds and rales. Decreased breath sounds and rales present. No wheezing or rhonchi.  Abdominal:     General: There is no distension.     Palpations: Abdomen is soft.     Tenderness: There is no abdominal tenderness. There is no guarding or rebound.  Musculoskeletal: Normal range of motion.        General: No swelling or tenderness.     Right lower leg: No edema.  Left lower leg: No edema.  Lymphadenopathy:     Cervical: No cervical adenopathy.  Skin:    General: Skin is warm and dry.     Findings: No erythema or rash.  Neurological:     Mental Status: She is alert and oriented to person, place, and time. Mental status is at baseline.  Psychiatric:        Mood and Affect: Mood normal.        Behavior: Behavior normal.        Thought Content: Thought content normal.        Judgment: Judgment normal.    Bradycardic, irregular. Diffuse crackles. LABORATORY PANEL:   CBC Recent Labs  Lab 08/08/18 1314  WBC 10.2  HGB 11.8*  HCT 34.9*  PLT 252   ------------------------------------------------------------------------------------------------------------------  Chemistries  Recent Labs  Lab 08/08/18 1314  NA 137  K 3.3*  CL 106  CO2 22  GLUCOSE  116*  BUN 11  CREATININE 0.45  CALCIUM 7.8*  AST 10*  ALT 17  ALKPHOS 73  BILITOT 0.8   ------------------------------------------------------------------------------------------------------------------  Cardiac Enzymes No results for input(s): TROPONINI in the last 168 hours. ------------------------------------------------------------------------------------------------------------------  RADIOLOGY:  Ct Angio Chest Pe W Or Wo Contrast  Result Date: 08/07/2018 CLINICAL DATA:  33 y.o. female with improving but persistent post-surgical ileus 3 Days Post-Op s/p exploratory laparotomy, small bowel resection, and EEA for perforated small bowel 4 days s/p laparoscopic tubal ligation, complicated by pertinent comorbidities including history of fibromyalgia, GERD, obesity, and current tobacco abuse. EXAM: CT ANGIOGRAPHY CHEST CT ABDOMEN AND PELVIS WITH CONTRAST TECHNIQUE: Multidetector CT imaging of the chest was performed using the standard protocol during bolus administration of intravenous contrast. Multiplanar CT image reconstructions and MIPs were obtained to evaluate the vascular anatomy. Multidetector CT imaging of the abdomen and pelvis was performed using the standard protocol during bolus administration of intravenous contrast. CONTRAST:  180mL ISOVUE-370 IOPAMIDOL (ISOVUE-370) INJECTION 76% COMPARISON:  Chest radiograph, 08/05/2018 FINDINGS: CTA CHEST FINDINGS Cardiovascular: There is satisfactory opacification of the pulmonary arteries to the segmental level. There is no evidence of a pulmonary embolism. Heart is top-normal in size. No pericardial effusion. No coronary artery calcifications. Great vessels are within normal limits. No aortic dissection. Mediastinum/Nodes: No enlarged mediastinal, hilar, or axillary lymph nodes. Thyroid gland, trachea, and esophagus demonstrate no significant findings. Lungs/Pleura: Small right and minimal left pleural effusions. Dependent atelectasis is noted  in the lower lobes, right greater than left, and base of the right middle lobe and minimally along the posterior inferior left upper lobe lingula. Remainder of the lungs is clear. No pneumothorax. Musculoskeletal: No fracture or acute finding. No osteoblastic or osteolytic lesions. Review of the MIP images confirms the above findings. CT ABDOMEN and PELVIS FINDINGS Hepatobiliary: Liver is unremarkable. Normal gallbladder. No bile duct dilation. Pancreas: Homogeneous enhancement. There is fluid attenuation bordering on the pancreatic head, which appears centered primarily on the duodenum. No convincing pancreatic inflammation. No masses. Spleen: Normal in size without focal abnormality. Adrenals/Urinary Tract: Adrenal glands are unremarkable. Kidneys are normal, without renal calculi, focal lesion, or hydronephrosis. Bladder is unremarkable. Stomach/Bowel: Stomach is mildly distended. No wall thickening or inflammation. Mild wall thickening of the first and second portions of the duodenum with adjacent inflammation/fluid attenuation, but also tracks along the gastrohepatic ligament, lies adjacent to the pancreatic head and extends to the anterior pararenal fascia. There is mild dilation most evident of the mid small bowel, maximum diameter 3.2 cm. There are associated air-fluid levels. There  is no wall thickening. A small bowel anastomosis is noted in the right mid abdomen. Right colon is partly fluid-filled. There is no dilation or wall thickening. Left colon is decompressed. Normal appendix is visualized. There is a trace amount of ascites that primarily collects over the liver, tracks along the porta hepatis adjacent to the gallbladder and duodenum and right pericolic gutter. There is a minimal amount of extraluminal air in the anterior lower peritoneal cavity, left lower quadrant. Vascular/Lymphatic: No significant vascular findings are present. No enlarged abdominal or pelvic lymph nodes. Reproductive: Uterus  and bilateral adnexa are unremarkable. Other: Small amount of low central anterior abdominal wall subcutaneous air. Subcutaneous edema noted along the posterior lower abdomen and pelvis and flanks. Musculoskeletal: No acute or significant osseous findings. Review of the MIP images confirms the above findings. IMPRESSION: CTA CHEST 1. No evidence of a pulmonary embolism. 2. Small right and minimal left pleural effusions. 3. Significant dependent atelectasis in the lower lobes, right greater than left. No convincing pneumonia. No pulmonary edema. ABDOMEN AND PELVIS CT 1. Mild central small bowel dilation with small bowel air-fluid levels, without a discrete transition point. This is consistent with an adynamic ileus. 2. Evidence of wall thickening of the first and second portions of the duodenum, which suggest duodenitis, with fluid attenuation/inflammation in the right upper quadrant. 3. No small bowel or colonic wall thickening to suggest bowel ischemia. 4. Minimal postoperative free intraperitoneal air. Trace amount of ascites. Electronically Signed   By: Lajean Manes M.D.   On: 08/07/2018 15:40   Ct Abdomen Pelvis W Contrast  Result Date: 08/07/2018 CLINICAL DATA:  33 y.o. female with improving but persistent post-surgical ileus 3 Days Post-Op s/p exploratory laparotomy, small bowel resection, and EEA for perforated small bowel 4 days s/p laparoscopic tubal ligation, complicated by pertinent comorbidities including history of fibromyalgia, GERD, obesity, and current tobacco abuse. EXAM: CT ANGIOGRAPHY CHEST CT ABDOMEN AND PELVIS WITH CONTRAST TECHNIQUE: Multidetector CT imaging of the chest was performed using the standard protocol during bolus administration of intravenous contrast. Multiplanar CT image reconstructions and MIPs were obtained to evaluate the vascular anatomy. Multidetector CT imaging of the abdomen and pelvis was performed using the standard protocol during bolus administration of intravenous  contrast. CONTRAST:  126mL ISOVUE-370 IOPAMIDOL (ISOVUE-370) INJECTION 76% COMPARISON:  Chest radiograph, 08/05/2018 FINDINGS: CTA CHEST FINDINGS Cardiovascular: There is satisfactory opacification of the pulmonary arteries to the segmental level. There is no evidence of a pulmonary embolism. Heart is top-normal in size. No pericardial effusion. No coronary artery calcifications. Great vessels are within normal limits. No aortic dissection. Mediastinum/Nodes: No enlarged mediastinal, hilar, or axillary lymph nodes. Thyroid gland, trachea, and esophagus demonstrate no significant findings. Lungs/Pleura: Small right and minimal left pleural effusions. Dependent atelectasis is noted in the lower lobes, right greater than left, and base of the right middle lobe and minimally along the posterior inferior left upper lobe lingula. Remainder of the lungs is clear. No pneumothorax. Musculoskeletal: No fracture or acute finding. No osteoblastic or osteolytic lesions. Review of the MIP images confirms the above findings. CT ABDOMEN and PELVIS FINDINGS Hepatobiliary: Liver is unremarkable. Normal gallbladder. No bile duct dilation. Pancreas: Homogeneous enhancement. There is fluid attenuation bordering on the pancreatic head, which appears centered primarily on the duodenum. No convincing pancreatic inflammation. No masses. Spleen: Normal in size without focal abnormality. Adrenals/Urinary Tract: Adrenal glands are unremarkable. Kidneys are normal, without renal calculi, focal lesion, or hydronephrosis. Bladder is unremarkable. Stomach/Bowel: Stomach is mildly distended. No  wall thickening or inflammation. Mild wall thickening of the first and second portions of the duodenum with adjacent inflammation/fluid attenuation, but also tracks along the gastrohepatic ligament, lies adjacent to the pancreatic head and extends to the anterior pararenal fascia. There is mild dilation most evident of the mid small bowel, maximum diameter  3.2 cm. There are associated air-fluid levels. There is no wall thickening. A small bowel anastomosis is noted in the right mid abdomen. Right colon is partly fluid-filled. There is no dilation or wall thickening. Left colon is decompressed. Normal appendix is visualized. There is a trace amount of ascites that primarily collects over the liver, tracks along the porta hepatis adjacent to the gallbladder and duodenum and right pericolic gutter. There is a minimal amount of extraluminal air in the anterior lower peritoneal cavity, left lower quadrant. Vascular/Lymphatic: No significant vascular findings are present. No enlarged abdominal or pelvic lymph nodes. Reproductive: Uterus and bilateral adnexa are unremarkable. Other: Small amount of low central anterior abdominal wall subcutaneous air. Subcutaneous edema noted along the posterior lower abdomen and pelvis and flanks. Musculoskeletal: No acute or significant osseous findings. Review of the MIP images confirms the above findings. IMPRESSION: CTA CHEST 1. No evidence of a pulmonary embolism. 2. Small right and minimal left pleural effusions. 3. Significant dependent atelectasis in the lower lobes, right greater than left. No convincing pneumonia. No pulmonary edema. ABDOMEN AND PELVIS CT 1. Mild central small bowel dilation with small bowel air-fluid levels, without a discrete transition point. This is consistent with an adynamic ileus. 2. Evidence of wall thickening of the first and second portions of the duodenum, which suggest duodenitis, with fluid attenuation/inflammation in the right upper quadrant. 3. No small bowel or colonic wall thickening to suggest bowel ischemia. 4. Minimal postoperative free intraperitoneal air. Trace amount of ascites. Electronically Signed   By: Lajean Manes M.D.   On: 08/07/2018 15:40   IMPRESSION AND PLAN:   A/P: 72F w/ PMHx GERD, admitted to Ob/Gyn s/p postpartum tubal ligation, complicated by small bowel enterotomy (s/p  small bowel resection + reanastomosis) and ileus. Ob/Gyn consult to Hospitalist service for bradycardia, SOB. Hyponatremia, hypokalemia, hyperglycemia, hypocalcemia, hypoproteinemia, hypoalbuminemia, BNP elevation, normocytic anemia. -Bradycardia, SOB: HR 40s-50s. Tachypneic. SpO2 stable on 0.5L [place], which I believe is more for anxiety/comfort. Pt's RN tells me the pt's oxygen demand has not increased. She is splinting. There is atelectasis on prior CT imaging. However, I appreciate diffuse fine crackles on lung exam. BNP 212. CXR report reads, "Mild diffuse pulmonary interstitial congestion with associated small right greater than left pleural effusions. Associated bibasilar opacities, likely atelectasis." I appreciate pulmonary vascular congestion and mild interstitial edema with small B/L pleural effusions on my review of the imaging. I am concerned for cardiac cause of symptoms (i.e. postpartum cardiomyopathy, viral cardiomyopathy). Trop-I (-) x1, trend. ASA. Echo pending. Holding continuous IVF, ordered for Lasix. TProt/Albumin low; U/A and urine protein also pending. NSAID D/Ced. PCT 0.15, WBC 9.6, afebrile, lower suspicion for bacterial infxn. CXR does not demonstrate obvious pneumonic/consolidative process. Already on ABx for her intraabdominal process. Pt does not have traditional risk factors that one would associate w/ development of fungemia or invasive fungal infxn. (-) LH/presyncope or LOC. K+ 3.2, replete and monitor. Mag and Phos WNL. Nutrition consult once ileus resolved and diet is advanced. Incentive spirometry.  All the records are reviewed and case discussed with ED provider. Management plans discussed with the patient, family and they are in agreement.  CODE STATUS: Full code.  TOTAL TIME TAKING CARE OF THIS PATIENT: 75 minutes.    Arta Silence M.D on 08/09/2018 at 2:23 AM  Between 7am to 6pm - Pager - [phone]  After 6pm go to [email or web address] - Proofreader  [facility]  Office  [phone]  CC: Primary care physician; Patient, No Pcp Per   Note: This dictation was prepared with Dragon dictation along with smaller phrase technology. Any transcriptional errors that result from this process are unintentional.

## 2018-08-10 LAB — CBC
HCT: 34.3 % — ABNORMAL LOW (ref 36.0–46.0)
Hemoglobin: 11.6 g/dL — ABNORMAL LOW (ref 12.0–15.0)
MCH: 32 pg (ref 26.0–34.0)
MCHC: 33.8 g/dL (ref 30.0–36.0)
MCV: 94.8 fL (ref 80.0–100.0)
Platelets: 381 10*3/uL (ref 150–400)
RBC: 3.62 MIL/uL — AB (ref 3.87–5.11)
RDW: 13 % (ref 11.5–15.5)
WBC: 12.5 10*3/uL — ABNORMAL HIGH (ref 4.0–10.5)
nRBC: 0 % (ref 0.0–0.2)

## 2018-08-10 LAB — C DIFFICILE QUICK SCREEN W PCR REFLEX
C Diff antigen: POSITIVE — AB
C Diff toxin: NEGATIVE

## 2018-08-10 LAB — COMPREHENSIVE METABOLIC PANEL
ALT: 18 U/L (ref 0–44)
AST: 16 U/L (ref 15–41)
Albumin: 2.4 g/dL — ABNORMAL LOW (ref 3.5–5.0)
Alkaline Phosphatase: 68 U/L (ref 38–126)
Anion gap: 9 (ref 5–15)
BUN: 6 mg/dL (ref 6–20)
CO2: 22 mmol/L (ref 22–32)
Calcium: 8 mg/dL — ABNORMAL LOW (ref 8.9–10.3)
Chloride: 104 mmol/L (ref 98–111)
Creatinine, Ser: 0.41 mg/dL — ABNORMAL LOW (ref 0.44–1.00)
GFR calc non Af Amer: >60 mL/min (ref >60)
Glucose, Bld: 113 mg/dL — ABNORMAL HIGH (ref 70–99)
Potassium: 3.9 mmol/L (ref 3.5–5.1)
Sodium: 135 mmol/L (ref 135–145)
Total Bilirubin: 0.7 mg/dL (ref 0.3–1.2)
Total Protein: 5.7 g/dL — ABNORMAL LOW (ref 6.5–8.1)

## 2018-08-10 LAB — LEGIONELLA PNEUMOPHILA SEROGP 1 UR AG: L. pneumophila Serogp 1 Ur Ag: NEGATIVE

## 2018-08-10 LAB — CLOSTRIDIUM DIFFICILE BY PCR, REFLEXED: Toxigenic C. Difficile by PCR: POSITIVE — AB

## 2018-08-10 MED ORDER — VANCOMYCIN 50 MG/ML ORAL SOLUTION
125.0000 mg | Freq: Four times a day (QID) | ORAL | Status: DC
  Administered 2018-08-10 – 2018-08-12 (×10): 125 mg via ORAL
  Filled 2018-08-10 (×17): qty 2.5

## 2018-08-10 MED ORDER — IBUPROFEN 800 MG PO TABS
400.0000 mg | ORAL_TABLET | Freq: Once | ORAL | Status: AC
  Administered 2018-08-11: 400 mg via ORAL
  Filled 2018-08-10: qty 1

## 2018-08-10 MED ORDER — DIPHENHYDRAMINE HCL 25 MG PO CAPS
25.0000 mg | ORAL_CAPSULE | Freq: Once | ORAL | Status: AC
  Administered 2018-08-10: 25 mg via ORAL
  Filled 2018-08-10: qty 1

## 2018-08-10 NOTE — Lactation Note (Addendum)
Lactation Consultation Note  Patient Name: Megan Bennett Date: 08/10/2018   Have visited mom a couple of times today to try and get her to pump for her baby at home that she says she was breast feeding.  Mom reports baby is taking bottles of formula at home and tolerating well, but does want to continue to supply her breast milk as well.  Once mom was transferred from room 346 to Isolation room 333, LC offered assistance to pump.  Mom finally agreed to pump if LC would hold flanges for her and clean pump parts afterward because she was just to tired and weak to do it herself.  Mom expressed 80 ml which was labeled and put on ice in her room for father of baby to pick up later this afternoon.  Lactation name and number written on white board and encouraged mom to call with any questions, concerns or further assistance with pumping.    Maternal Data    Feeding    LATCH Score                   Interventions    Lactation Tools Discussed/Used     Consult Status      Jarold Motto 08/10/2018, 5:33 PM

## 2018-08-10 NOTE — Anesthesia Postprocedure Evaluation (Signed)
Anesthesia Post Note  Patient: Megan Bennett  Procedure(s) Performed: LAPAROSCOPY DIAGNOSTIC (N/A ) SMALL BOWEL REPAIR (N/A Abdomen)  Patient location during evaluation: PACU Anesthesia Type: General Level of consciousness: awake and alert and oriented Pain management: pain level controlled Vital Signs Assessment: post-procedure vital signs reviewed and stable Respiratory status: spontaneous breathing Cardiovascular status: blood pressure returned to baseline Anesthetic complications: no     Last Vitals:  Vitals:   08/10/18 1552 08/10/18 1725  BP: 135/85   Pulse: (!) 56   Resp: 20   Temp: 36.8 C   SpO2: 95% 96%    Last Pain:  Vitals:   08/10/18 1552  TempSrc: Oral  PainSc:                  Kaliel Bolds

## 2018-08-10 NOTE — Progress Notes (Signed)
Pt refused help with pumping (has not pumped since earlier this morning). Encouraged to sit in chair again after walking to bathroom- refused wanted to lay back down stating "I just need some oxygen". Oxygen saturations 95% on room air. Nasal cannula applied with 1 liter oxygen at pt request. Pt states "I feel rejuvenated, I wish I could wear oxygen all the time"

## 2018-08-10 NOTE — Progress Notes (Signed)
Physical Therapy Treatment Patient Details Name: Megan Bennett MRN: [id number] DOB: 08-28-1985 Today's Date: 08/10/2018    History of Present Illness 33 y/o female 8 weeks post partum, had tubal ligation 2/09 with complications and is now post lapro with bowel resection and post surgical ulius.  History of fibromyalgia, GERD, obesity, and current tobacco abuse.    PT Comments    Pt agreeable to PT; reports abdominal and B flank pain 5/10; pt is concerned with pain. Notes nursing aware; advised pt to monitor and address with nursing/MD if worsening versus improving. Pt notes fatigue. Requires Min A bed mobility, modified independence with STS multiple surfaces and Min guard ambulation. Assist required or personal care in the bathroom. Fatigues quickly. O2 saturation decrease post ambulation and bathroom use to 84% with mild lightheadedness; improved with pursed lip breathing and seated rest. Declines further PT at this time. Continue PT to progress strength and endurance to promote optimal return home.   Follow Up Recommendations  No PT follow up     Equipment Recommendations       Recommendations for Other Services       Precautions / Restrictions Precautions Precautions: None Restrictions Weight Bearing Restrictions: No    Mobility  Bed Mobility Overal bed mobility: Needs Assistance Bed Mobility: Sit to Supine     Supine to sit: Min assist Sit to supine: Min assist;HOB elevated   General bed mobility comments: For trunk to sit; LLE to supine  Transfers Overall transfer level: Modified independent               General transfer comment: Increased fwd flexion to stand bracing abdomen with arms  Ambulation/Gait Ambulation/Gait assistance: Min guard Gait Distance (Feet): 60 Feet(in room) Assistive device: (Dynamap device) Gait Pattern/deviations: Trunk flexed;Wide base of support(sways side to side)         Stairs             Wheelchair  Mobility    Modified Rankin (Stroke Patients Only)       Balance Overall balance assessment: Needs assistance Sitting-balance support: Feet supported;Bilateral upper extremity supported Sitting balance-Leahy Scale: Good     Standing balance support: Bilateral upper extremity supported Standing balance-Leahy Scale: Fair                              Cognition Arousal/Alertness: Awake/alert Behavior During Therapy: WFL for tasks assessed/performed Overall Cognitive Status: Within Functional Limits for tasks assessed                                        Exercises Other Exercises Other Exercises: supervision on/off commode. Assist with personal care on back side     General Comments        Pertinent Vitals/Pain Pain Assessment: 0-10 Pain Score: 5  Pain Location: Abdomen and B flank R > L Pain Descriptors / Indicators: Constant;Discomfort;Grimacing;Aching Pain Intervention(s): Monitored during session    Home Living                      Prior Function            PT Goals (current goals can now be found in the care plan section) Progress towards PT goals: Progressing toward goals    Frequency    Min 2X/week      PT Plan Current  plan remains appropriate    Co-evaluation              AM-PAC PT "6 Clicks" Mobility   Outcome Measure  Help needed turning from your back to your side while in a flat bed without using bedrails?: A Little Help needed moving from lying on your back to sitting on the side of a flat bed without using bedrails?: A Little Help needed moving to and from a bed to a chair (including a wheelchair)?: A Little Help needed standing up from a chair using your arms (e.g., wheelchair or bedside chair)?: None Help needed to walk in hospital room?: A Little Help needed climbing 3-5 steps with a railing? : A Little 6 Click Score: 19    End of Session   Activity Tolerance: Patient limited by  fatigue;Patient limited by pain Patient left: in bed;with call bell/phone within reach   PT Visit Diagnosis: Difficulty in walking, not elsewhere classified (R26.2);Pain;Muscle weakness (generalized) (M62.81)     Time: 1007-1219 PT Time Calculation (min) (ACUTE ONLY): 15 min  Charges:  $Gait Training: 8-22 mins                      Larae Grooms, PTA 08/10/2018, 4:20 PM

## 2018-08-10 NOTE — Progress Notes (Signed)
Pt has had diarrhea x1 this shift- PO vancomycin given as ordered. Standby assist to bathroom- able to wipe perineal area but states "I can't wipe my butt". VSS, remains on telemetry (HR average around 50-52, asymptomatic) and continuous pulse oximetry (weaned to room air this morning maintaining 95-96%, refused incentive spirometry- educated). Encouraged pt to sit up in chair- pt refused "I'm tired and just want to be left alone to rest". Pt pumped with LC this morning- required LC to hold during entire pumping session. Will continue to closely monitor.

## 2018-08-10 NOTE — Progress Notes (Signed)
[place] [facility] at [place] NAME: Megan Bennett    MR#:  [id number]  DATE OF BIRTH:  June 27, 1985  SUBJECTIVE: Patient has C. difficile diarrhea, on isolation and also on vancomycin.  Today more alert, wants to eat, heart rate around 54 bpm.  CHIEF COMPLAINT:  No chief complaint on file.   REVIEW OF SYSTEMS:   ROS CONSTITUTIONAL: No fever, fatigue or weakness.  EYES: No blurred or double vision.  EARS, NOSE, AND THROAT: No tinnitus or ear pain.  RESPIRATORY: No cough, shortness of breath, wheezing or hemoptysis.  CARDIOVASCULAR: No chest pain, orthopnea, edema.  GASTROINTESTINAL: Less nausea, less diarrhea.  GENITOURINARY: No dysuria, hematuria.  ENDOCRINE: No polyuria, nocturia,  HEMATOLOGY: No anemia, easy bruising or bleeding SKIN: No rash or lesion. MUSCULOSKELETAL: No joint pain or arthritis.   NEUROLOGIC: No tingling, numbness, weakness.  PSYCHIATRY: No anxiety or depression.   DRUG ALLERGIES:   Allergies  Allergen Reactions  . Amoxicillin Rash    Did it involve swelling of the face/tongue/throat, SOB, or low BP? No Did it involve sudden or severe rash/hives, skin peeling, or any reaction on the inside of your mouth or nose? No Did you need to seek medical attention at a hospital or doctor's office? No When did it last happen?14 + years If all above answers are "NO", may proceed with cephalosporin use.,   . Wellbutrin [Bupropion] Other (See Comments)    shakey    VITALS:  Blood pressure 137/78, pulse (!) 54, temperature 98 F (36.7 C), resp. rate (!) 32, height 5\' 8"  (1.727 m), weight 100 kg, SpO2 97 %, currently breastfeeding.  PHYSICAL EXAMINATION:  GENERAL:  33 y.o.-year-old patient lying in the bed with no acute distress.  EYES: Pupils equal, round, reactive to light  No scleral icterus. Extraocular muscles intact.  HEENT: Head atraumatic, normocephalic. Oropharynx and nasopharynx clear.  NECK:  Supple,  no jugular venous distention. No thyroid enlargement, no tenderness.  LUNGS: Normal breath sounds bilaterally, no wheezing, rales,rhonchi or crepitation. No use of accessory muscles of respiration.  CARDIOVASCULAR: S1, S2 normal. No murmurs, rubs, or gallops.  ABDOMEN: Soft, bowel sounds present.Marland Kitchen  EXTREMITIES: No pedal edema, cyanosis, or clubbing.  NEUROLOGIC: Cranial nerves II through XII are intact. Muscle strength 5/5 in all extremities. Sensation intact. Gait not checked.  PSYCHIATRIC: The patient is alert and oriented x 3.  SKIN: No obvious rash, lesion, or ulcer.    LABORATORY PANEL:   CBC Recent Labs  Lab 08/10/18 0553  WBC 12.5*  HGB 11.6*  HCT 34.3*  PLT 381   ------------------------------------------------------------------------------------------------------------------  Chemistries  Recent Labs  Lab 08/09/18 0246  08/10/18 0553  NA 134*   < > 135  K 3.2*   < > 3.9  CL 105   < > 104  CO2 21*   < > 22  GLUCOSE 118*   < > 113*  BUN 12   < > 6  CREATININE 0.48   < > 0.41*  CALCIUM 7.7*   < > 8.0*  MG 1.8  --   --   AST 15   < > 16  ALT 18   < > 18  ALKPHOS 66   < > 68  BILITOT 0.8   < > 0.7   < > = values in this interval not displayed.   ------------------------------------------------------------------------------------------------------------------  Cardiac Enzymes Recent Labs  Lab 08/09/18 1542  TROPONINI <0.03   ------------------------------------------------------------------------------------------------------------------  RADIOLOGY:  Dg Chest [facility]  1 View  Result Date: 08/09/2018 CLINICAL DATA:  Initial evaluation for acute shortness of breath. EXAM: PORTABLE CHEST 1 VIEW COMPARISON:  Prior radiograph from 08/05/2010 FINDINGS: Mild cardiomegaly, stable. Mediastinal silhouette within normal limits. Lungs are hypoinflated. Small right greater than left pleural effusions, similar to previous. Associated bibasilar opacities favored to reflect  atelectasis. Mild diffuse pulmonary interstitial congestion without frank pulmonary edema. No other consolidative opacity. No pneumothorax. No acute osseous finding. IMPRESSION: 1. Mild diffuse pulmonary interstitial congestion with associated small right greater than left pleural effusions. 2. Associated bibasilar opacities, likely atelectasis. Electronically Signed   By: Jeannine Boga M.D.   On: 08/09/2018 02:39    EKG:   Orders placed or performed during the hospital encounter of 08/04/18  . EKG 12-Lead  . EKG 12-Lead  . EKG 12-Lead  . EKG 12-Lead  . EKG 12-Lead  . EKG 12-Lead  . EKG 12-Lead  . EKG 12-Lead    ASSESSMENT AND PLAN:   33 year old female patient postop day 5 from ex lap with small bowel resection for enterotomy complicated by postop ileus Postop ileus, improving, started on liquid diet 2.  C. difficile diarrhea, started on vancomycin 3.  Shortness of breath, bradycardia: Asymptomatic, patient echo is normal with normal ejection fraction. 4.  Asymptomatic sinus bradycardia: Heart rate is around 54 bpm.  Heart rate is better, ,  recommend cardiology consult patient has persistent symptomatic bradycardia.  Patient has no symptoms at this time. 5. essential hypertension: BP improved after starting on  IVhydralazine.   All the records are reviewed and case discussed with Care Management/Social Workerr. Management plans discussed with the patient, family and they are in agreement.  CODE STATUS: Full code  TOTAL TIME TAKING CARE OF THIS PATIENT: 35 minutes.   POSSIBLE D/C IN  1-2 DAYS, DEPENDING ON CLINICAL CONDITION.   Epifanio Lesches M.D on 08/10/2018 at 12:23 PM  Between 7am to 6pm - Pager - [phone]  After 6pm go to [email or web address] - password [email or web address] [facility]  Office  [phone]  CC: Primary care physician; Patient, No Pcp Per   Note: This dictation was prepared with Dragon dictation along with smaller phrase technology.  Any transcriptional errors that result from this process are unintentional.

## 2018-08-10 NOTE — Progress Notes (Addendum)
Pt requesting diet advancement (currently tolerating clear liquids without problem, frequently passing gas, audible bowel sounds). Dr. Leonides Schanz paged.   Pt set up to recliner. Refused help getting set up to pump again- told pt to notify staff when she would like to pump. Breastmilk in room on ice due to isolation precautions- encouraged pt to ask family to pick up today if possible.

## 2018-08-11 LAB — CALCIUM, IONIZED: CALCIUM, IONIZED, SERUM: 4.9 mg/dL (ref 4.5–5.6)

## 2018-08-11 LAB — HIV ANTIBODY (ROUTINE TESTING W REFLEX): HIV Screen 4th Generation wRfx: NONREACTIVE

## 2018-08-11 LAB — VITAMIN D 25 HYDROXY (VIT D DEFICIENCY, FRACTURES): Vit D, 25-Hydroxy: 19.8 ng/mL — ABNORMAL LOW (ref 30.0–100.0)

## 2018-08-11 MED ORDER — DIPHENHYDRAMINE HCL 25 MG PO CAPS
50.0000 mg | ORAL_CAPSULE | Freq: Four times a day (QID) | ORAL | Status: DC | PRN
  Administered 2018-08-11: 50 mg via ORAL
  Filled 2018-08-11: qty 2

## 2018-08-11 MED ORDER — VITAMIN D 25 MCG (1000 UNIT) PO TABS
1000.0000 [IU] | ORAL_TABLET | Freq: Every day | ORAL | Status: DC
  Administered 2018-08-11 – 2018-08-12 (×2): 1000 [IU] via ORAL
  Filled 2018-08-11 (×2): qty 1

## 2018-08-11 MED ORDER — PANTOPRAZOLE SODIUM 40 MG PO TBEC
40.0000 mg | DELAYED_RELEASE_TABLET | Freq: Every day | ORAL | Status: DC
  Administered 2018-08-11 – 2018-08-12 (×2): 40 mg via ORAL
  Filled 2018-08-11 (×3): qty 1

## 2018-08-11 MED ORDER — ACETAMINOPHEN 325 MG PO TABS
650.0000 mg | ORAL_TABLET | Freq: Four times a day (QID) | ORAL | Status: DC | PRN
  Administered 2018-08-11: 650 mg via ORAL
  Filled 2018-08-11: qty 2

## 2018-08-11 NOTE — Progress Notes (Addendum)
[place] at [place] NAME: Megan Bennett    MR#:  [id number]  DATE OF BIRTH:  Nov 14, 1985  SUBJECTIVE: Less diarrhea, tolerating the diet, no hypoxia bradycardia improved.  Patient is eager to go home.  CHIEF COMPLAINT:  No chief complaint on file.   REVIEW OF SYSTEMS:   ROS CONSTITUTIONAL: No fever, fatigue or weakness.  EYES: No blurred or double vision.  EARS, NOSE, AND THROAT: No tinnitus or ear pain.  RESPIRATORY: No cough, shortness of breath, wheezing or hemoptysis.  CARDIOVASCULAR: No chest pain, orthopnea, edema.  GASTROINTESTINAL: Less nausea, less diarrhea.  GENITOURINARY: No dysuria, hematuria.  ENDOCRINE: No polyuria, nocturia,  HEMATOLOGY: No anemia, easy bruising or bleeding SKIN: No rash or lesion. MUSCULOSKELETAL: No joint pain or arthritis.   NEUROLOGIC: No tingling, numbness, weakness.  PSYCHIATRY: No anxiety or depression.   DRUG ALLERGIES:   Allergies  Allergen Reactions  . Amoxicillin Rash    Did it involve swelling of the face/tongue/throat, SOB, or low BP? No Did it involve sudden or severe rash/hives, skin peeling, or any reaction on the inside of your mouth or nose? No Did you need to seek medical attention at a hospital or doctor's office? No When did it last happen?14 + years If all above answers are "NO", may proceed with cephalosporin use.,   . Wellbutrin [Bupropion] Other (See Comments)    shakey    VITALS:  Blood pressure 107/68, pulse 75, temperature 98 F (36.7 C), temperature source Oral, resp. rate 18, height 5\' 8"  (1.727 m), weight 100 kg, SpO2 97 %, currently breastfeeding.  PHYSICAL EXAMINATION:  GENERAL:  33 y.o.-year-old patient lying in the bed with no acute distress.  EYES: Pupils equal, round, reactive to light  No scleral icterus. Extraocular muscles intact.  HEENT: Head atraumatic, normocephalic. Oropharynx and nasopharynx clear.  NECK:  Supple, no jugular  venous distention. No thyroid enlargement, no tenderness.  LUNGS: Normal breath sounds bilaterally, no wheezing, rales,rhonchi or crepitation. No use of accessory muscles of respiration.  CARDIOVASCULAR: S1, S2 normal. No murmurs, rubs, or gallops.  ABDOMEN: Soft, bowel sounds present.Marland Kitchen  EXTREMITIES: No pedal edema, cyanosis, or clubbing.  NEUROLOGIC: Cranial nerves II through XII are intact. Muscle strength 5/5 in all extremities. Sensation intact. Gait not checked.  PSYCHIATRIC: The patient is alert and oriented x 3.  SKIN: No obvious rash, lesion, or ulcer.    LABORATORY PANEL:   CBC Recent Labs  Lab 08/10/18 0553  WBC 12.5*  HGB 11.6*  HCT 34.3*  PLT 381   ------------------------------------------------------------------------------------------------------------------  Chemistries  Recent Labs  Lab 08/09/18 0246  08/10/18 0553  NA 134*   < > 135  K 3.2*   < > 3.9  CL 105   < > 104  CO2 21*   < > 22  GLUCOSE 118*   < > 113*  BUN 12   < > 6  CREATININE 0.48   < > 0.41*  CALCIUM 7.7*   < > 8.0*  MG 1.8  --   --   AST 15   < > 16  ALT 18   < > 18  ALKPHOS 66   < > 68  BILITOT 0.8   < > 0.7   < > = values in this interval not displayed.   ------------------------------------------------------------------------------------------------------------------  Cardiac Enzymes Recent Labs  Lab 08/09/18 1542  TROPONINI <0.03   ------------------------------------------------------------------------------------------------------------------  RADIOLOGY:  No results found.  EKG:   Orders placed  or performed during the hospital encounter of 08/04/18  . EKG 12-Lead  . EKG 12-Lead  . EKG 12-Lead  . EKG 12-Lead  . EKG 12-Lead  . EKG 12-Lead  . EKG 12-Lead  . EKG 12-Lead    ASSESSMENT AND PLAN:   33 year old female patient postop day 5 from ex lap with small bowel resection for enterotomy complicated by postop ileus Postop ileus, improving, now tolerating soft  diet. 2.  C. difficile diarrhea, started on vancomycin, complete for total of 14 days. 3.  Shortness of breath, bradycardia: Asymptomatic, patient echo is normal with normal ejection fraction. 4.  Asymptomatic sinus bradycardia improved, her heart rate is 63 bpm. 5. essential hypertension: BP improved after starting on  IVhydralazine. H her bradycardia improved, BP also stable   will sign off, recall if needed.  All the records are reviewed and case discussed with Care Management/Social Workerr. Management plans discussed with the patient, family and they are in agreement.  CODE STATUS: Full code  TOTAL TIME TAKING CARE OF THIS PATIENT: 35 minutes.   Disposition as per GYN recommendation.    Epifanio Lesches M.D on 08/11/2018 at 1:00 PM  Between 7am to 6pm - Pager - [phone]  After 6pm go to [email or web address] - password [email or web address] [place] [facility]  Office  [phone]  CC: Primary care physician; Patient, No Pcp Per   Note: This dictation was prepared with Dragon dictation along with smaller phrase technology. Any transcriptional errors that result from this process are unintentional.

## 2018-08-11 NOTE — Progress Notes (Signed)
Ice replaced surrounding breast milk in pt room. Pt sitting up in bed talking on phone, no c/o pain, all personal items and call bell within reach. Pt stated she got up from recliner to bathroom and back to bed without problem. Encouraged pt to continue ambulation and call for assistance if needed.

## 2018-08-11 NOTE — Progress Notes (Signed)
Pt upset that milk has to be discarded due to being at room temp for unknown amount of time, pt states discard milk, pt and nurse have a plan to pump every 3 hours, ice in bin provided, pt states understanding of trying to get family to take milk home. Pt ambulates in room x3 laps without difficulty, pt states "it just hurts". Pt back to bed after voiding, oxygen 95 on room air, and heart rate 70 after ambulating. Pt set up to pump, CNA at bedside assisting.

## 2018-08-11 NOTE — Progress Notes (Signed)
Pt awake and alert, states feels much better. Ice is in bin where 3 bottles of breast milk are located.

## 2018-08-11 NOTE — Progress Notes (Addendum)
After PRN medications administered, educated patient when medications are available again.  RN asked patient if she would like to be woken up at that time to receive medications.  Pt stated "No, I do not want to be woken up."  RN educated patient to call out if she needs anything, including her PRN medications, but otherwise RN would let her sleep.  Patient verbally agreed. Ice for patient's pumped breast milk replaced at this time.

## 2018-08-11 NOTE — Progress Notes (Signed)
During shift assessment pt stated that after her PRN pain meds are given to her this evening at 2100 that she does not want to be awoken for anything, unless it is an antibiotic.

## 2018-08-11 NOTE — Progress Notes (Signed)
Verbal order to discontinue telemetry and continue pulse oximetry per Dr. Vianne Bulls (consulted hospitalist). Primary RN Janett Billow notified.

## 2018-08-11 NOTE — Progress Notes (Signed)
S: Pt doing much better, showered alone, no diarrhea today. +Ambulation without assistance - tolerating soft diet  PE:  BP 107/68 (BP Location: Left Arm)   Pulse 75   Temp 98 F (36.7 C) (Oral)   Resp 18   Ht 5\' 8"  (1.727 m)   Wt 100 kg   SpO2 97%   BMI 33.51 kg/m   . Gen: NAD, alert and cooperative Abd: Incisions c/d/i. No erythema, surgical glue in place Ext: mild edema  A/P: 32yo, HD 8 POD 7 from dx lap, ex lap with small bowel resection for trochar enterotomy, and postop ileus.  1. Ileus is resolving, tolerating soft diet 2. Asymptomatic bradycardia: resolved - s/p cardiac workup - echocardiogram, electrolytes wnl - hospitalist signed off 3. C. Diff: on po vanco, diarrhea improving - desires home bedside commode as toilet not on same floor as her bedroom 4. Continue pumping, with impeccable hand hygiene  5. Is no longer diuresing, vitals stable though lower than her home BP 6. Possible discharge tomorrow  Will order prilosec and benadryl for sleep at her request

## 2018-08-11 NOTE — Progress Notes (Signed)
RN assisted pt to bathroom, pt ambulated without difficulty, pt stated needed help, pt states unable to wipe bottom with the hat in the toilet, RN removed hat and encouraged pt to clean self, pt wiped self with toilet paper and wet wipes independently, pt ambulated back to bed without difficulty, pts oxygen was 94-95 on RA post ambulation, pt in bed, RN encourages ambulation after breakfast, pt agreed with plan. Pt states has breast milk at bedside, no ice present only water, RN educated pt about someone getting breast milk and taking home. Unsure how long ice has been melted. Educated on how to store milk safely.

## 2018-08-12 MED ORDER — VANCOMYCIN HCL 125 MG PO CAPS: 125.0000 mg | ORAL_CAPSULE | Freq: Four times a day (QID) | ORAL | 0 refills | Status: AC

## 2018-08-12 MED ORDER — VANCOMYCIN 50 MG/ML ORAL SOLUTION: 125.0000 mg | Freq: Four times a day (QID) | ORAL | 0 refills | Status: DC

## 2018-08-12 MED ORDER — IBUPROFEN 600 MG PO TABS: 600.0000 mg | ORAL_TABLET | Freq: Four times a day (QID) | ORAL | 0 refills | Status: DC

## 2018-08-12 MED ORDER — ONDANSETRON 4 MG PO TBDP: 4.0000 mg | ORAL_TABLET | Freq: Three times a day (TID) | ORAL | 2 refills | Status: DC | PRN

## 2018-08-12 MED ORDER — OXYCODONE HCL 5 MG PO TABS: 5.0000 mg | ORAL_TABLET | ORAL | 0 refills | Status: AC | PRN

## 2018-08-12 MED ORDER — PANTOPRAZOLE SODIUM 40 MG PO TBEC: 40.0000 mg | DELAYED_RELEASE_TABLET | Freq: Every day | ORAL | 2 refills | Status: DC

## 2018-08-12 NOTE — Progress Notes (Signed)
Physical Therapy Treatment Patient Details Name: Megan Bennett MRN: [id number] DOB: 1986-02-19 Today's Date: 08/12/2018    History of Present Illness 33 y/o female 8 weeks post partum, had tubal ligation 7/25 with complications and is now post lapro with bowel resection and post surgical ulius.  History of fibromyalgia, GERD, obesity, and current tobacco abuse.    PT Comments    Pt presents with min deficits in strength, transfers, mobility, gait, balance, and activity tolerance but is progressing well towards goals.  Pt required min A with bed mobility tasks and SBA with transfers with good eccentric and concentric control.  Pt was able to amb 60' without an AD with SBA with slow cadence and short B step length but was steady without LOB.  Pt left in chair with breakfast in NAD.      Follow Up Recommendations  No PT follow up     Equipment Recommendations  Rolling walker with 5" wheels    Recommendations for Other Services       Precautions / Restrictions Precautions Precautions: None Restrictions Weight Bearing Restrictions: No    Mobility  Bed Mobility Overal bed mobility: Needs Assistance       Supine to sit: Min assist;HOB elevated Sit to supine: Min assist;HOB elevated   General bed mobility comments: Log roll technique training provided for decreased abdominal strain  Transfers Overall transfer level: Modified independent Equipment used: None             General transfer comment: Good eccentric and concentric control with transfers  Ambulation/Gait Ambulation/Gait assistance: Supervision Gait Distance (Feet): 60 Feet Assistive device: None Gait Pattern/deviations: Wide base of support Gait velocity: decreased   General Gait Details: Slow cadence with short B step length but steady including during 180 deg turns   Stairs             Wheelchair Mobility    Modified Rankin (Stroke Patients Only)       Balance Overall balance  assessment: Needs assistance Sitting-balance support: Feet supported;Bilateral upper extremity supported Sitting balance-Leahy Scale: Good     Standing balance support: No upper extremity supported Standing balance-Leahy Scale: Good                              Cognition Arousal/Alertness: Awake/alert Behavior During Therapy: WFL for tasks assessed/performed Overall Cognitive Status: Within Functional Limits for tasks assessed                                        Exercises Total Joint Exercises Ankle Circles/Pumps: Strengthening;AROM;Both;10 reps Quad Sets: Strengthening;Both;10 reps Gluteal Sets: Strengthening;Both;10 reps Long Arc Quad: AROM;Both;10 reps Knee Flexion: AROM;Both;10 reps Other Exercises Other Exercises: Log roll training    General Comments        Pertinent Vitals/Pain Pain Assessment: 0-10 Pain Score: 6  Pain Location: Abdomen and B flank R > L Pain Descriptors / Indicators: Aching;Sore Pain Intervention(s): Premedicated before session;Monitored during session    Home Living                      Prior Function            PT Goals (current goals can now be found in the care plan section) Progress towards PT goals: Progressing toward goals    Frequency    Min 2X/week  PT Plan Current plan remains appropriate    Co-evaluation              AM-PAC PT "6 Clicks" Mobility   Outcome Measure  Help needed turning from your back to your side while in a flat bed without using bedrails?: A Little Help needed moving from lying on your back to sitting on the side of a flat bed without using bedrails?: A Little Help needed moving to and from a bed to a chair (including a wheelchair)?: A Little Help needed standing up from a chair using your arms (e.g., wheelchair or bedside chair)?: None Help needed to walk in hospital room?: A Little Help needed climbing 3-5 steps with a railing? : A Little 6 Click  Score: 19    End of Session   Activity Tolerance: Patient tolerated treatment well Patient left: in chair;with call bell/phone within reach Nurse Communication: Mobility status PT Visit Diagnosis: Difficulty in walking, not elsewhere classified (R26.2);Pain;Muscle weakness (generalized) (M62.81) Pain - part of body: (abdominal)     Time: 4765-4650 PT Time Calculation (min) (ACUTE ONLY): 15 min  Charges:  $Therapeutic Activity: 8-22 mins                     D. Scott Aziz Slape PT, DPT 08/12/18, 9:59 AM

## 2018-08-12 NOTE — Progress Notes (Signed)
Entered pt room to replace ice for pumped breast milk.  Pt was awake at this time.  Pt stated that her nausea was much better and declined any medication for nausea at this time.  Pt got up to use the restroom, moving well and independently.  Pt requested pain medication on her return to bed.  Pt stated that she would call if RN was needed and would like to return to sleep. RN stated she would return just to replenish ice for pumped breast milk unless otherwise notified by patient.

## 2018-08-12 NOTE — Progress Notes (Signed)
DC inst reviewed with pt.  Pt expressed that she is ready for dc today and ready to see her baby.  She is currently pumping and storing her breast milk on ice.  Tol Soft diet well today with no N/V.   Awaiting MD to round for possible dc.

## 2018-08-12 NOTE — Care Management (Signed)
Patient discharged prior to assessment. RNCM confirmed with bedside RN that patient was ambulating independently and BSC and RW was not indicated.  RNCM reached patient by phone to offer to have equipment  Delivered to home, or to pick up from retail store. Patient declines.  RNCM noted that patients oral vanc was written for liquid.  MD to change order to capsules, and electronically send to [place]. Patient updated.  RNCM confirmed that [facility] has capsules in stock and will be covered by her medicaid.

## 2018-08-12 NOTE — Progress Notes (Signed)
DC to home to car via [employer] transport [employer].  Rx given to take home.

## 2018-08-13 NOTE — Discharge Summary (Signed)
Gynecology Physician Postoperative Discharge Summary  Patient ID: Megan Bennett MRN: [id number] DOB/AGE: 1986-04-07 33 y.o.  Admit Date: 08/04/2018 Discharge Date: 08/13/2018  Preoperative Diagnoses: perforated bowel Postoperative diagnosis:  same  Procedures: Procedure(s) (LRB): LAPAROSCOPY DIAGNOSTIC (N/A) SMALL BOWEL REPAIR (N/A)  CBC Latest Ref Rng & Units 08/10/2018 08/09/2018 08/08/2018  WBC 4.0 - 10.5 K/uL 12.5(H) 9.6 10.2  Hemoglobin 12.0 - 15.0 g/dL 11.6(L) 11.2(L) 11.8(L)  Hematocrit 36.0 - 46.0 % 34.3(L) 32.9(L) 34.9(L)  Platelets 150 - 400 K/uL 381 256 252    Hospital Course:  Megan Bennett is a 33 y.o. G1P1001  admitted from the ED with suspicion of perforated bowel one day after a laparoscopic tubal ligation. She stared with a diagnostic laparoscopy and when the injury to the small bowel was noted, it was converted to exploratory laparotomy and small bowel excision and reanastomsosis.   Shehad slow-to-return bowel function, and was kept on antibiotics. She had bradycardia and was evaluated by internal medicine.  She had a CT scan, EKG, echocardiogram.  She developed c-difficile and was given PO vancomycin. By time of discharge on POD#8, her pain was controlled on oral pain medications; she was ambulating, voiding without difficulty, tolerating regular diet and passing formed stools. She was deemed stable for discharge to home.   Discharge Exam: Blood pressure 130/82, pulse (!) 103, temperature 99 F (37.2 C), temperature source Axillary, resp. rate (!) 22, height 5\' 8"  (1.727 m), weight 100 kg, SpO2 96 %, currently breastfeeding. General appearance: alert and no distress  Resp: clear to auscultation bilaterally, normal respiratory effort Cardio: regular rate and rhythm  GI: soft, non-tender; bowel sounds normal; no masses, no organomegaly.  Incision: C/D/I, no erythema, no drainage noted Pelvic: scant blood on pad  Extremities: extremities normal, atraumatic,  no cyanosis or edema and Homans sign is negative, no sign of DVT  Discharged Condition: Stable  Disposition:   Discharge Instructions    Diet - low sodium heart healthy   Complete by:  As directed    Increase activity slowly   Complete by:  As directed      Allergies as of 08/12/2018      Reactions   Amoxicillin Rash   Did it involve swelling of the face/tongue/throat, SOB, or low BP? No Did it involve sudden or severe rash/hives, skin peeling, or any reaction on the inside of your mouth or nose? No Did you need to seek medical attention at a hospital or doctor's office? No When did it last happen?14 + years If all above answers are "NO", may proceed with cephalosporin use.,   Wellbutrin [bupropion] Other (See Comments)   shakey      Medication List    STOP taking these medications   omeprazole 20 MG capsule Commonly known as:  PRILOSEC     TAKE these medications   acetaminophen 500 MG tablet Commonly known as:  TYLENOL Take 2 tablets (1,000 mg total) by mouth every 6 (six) hours as needed (for pain scale < 4).   docusate sodium 100 MG capsule Commonly known as:  COLACE Take 1 capsule (100 mg total) by mouth 2 (two) times daily.   ibuprofen 600 MG tablet Commonly known as:  ADVIL,MOTRIN Take 1 tablet (600 mg total) by mouth every 6 (six) hours.   ondansetron 4 MG disintegrating tablet Commonly known as:  ZOFRAN ODT Take 1 tablet (4 mg total) by mouth every 8 (eight) hours as needed for nausea or vomiting.   oxyCODONE 5 MG immediate  release tablet Commonly known as:  ROXICODONE Take 1 tablet (5 mg total) by mouth every 4 (four) hours as needed for moderate pain.   pantoprazole 40 MG tablet Commonly known as:  PROTONIX Take 1 tablet (40 mg total) by mouth daily.   PREPLUS 27-1 MG Tabs Take 1 tablet by mouth daily.   vancomycin 125 MG capsule Commonly known as:  VANCOCIN Take 1 capsule (125 mg total) by mouth 4 (four) times daily for 8 days.       Follow-up Information    Megan Bennett, Megan Loh, MD Follow up in 2 week(s).   Specialty:  Obstetrics and Gynecology Contact information: [place] [address] [phone]           Signed:  [place] Attending [facility]

## 2018-08-14 LAB — CULTURE, BLOOD (ROUTINE X 2)
Culture: NO GROWTH
Culture: NO GROWTH
SPECIAL REQUESTS: ADEQUATE
SPECIAL REQUESTS: ADEQUATE

## 2019-04-20 DIAGNOSIS — Z6837 Body mass index (BMI) 37.0-37.9, adult: Secondary | ICD-10-CM | POA: Diagnosis not present

## 2019-04-20 DIAGNOSIS — G5603 Carpal tunnel syndrome, bilateral upper limbs: Secondary | ICD-10-CM | POA: Diagnosis not present

## 2019-04-20 DIAGNOSIS — E8881 Metabolic syndrome: Secondary | ICD-10-CM | POA: Diagnosis not present

## 2019-04-20 DIAGNOSIS — M25531 Pain in right wrist: Secondary | ICD-10-CM | POA: Diagnosis not present

## 2019-04-20 DIAGNOSIS — M25532 Pain in left wrist: Secondary | ICD-10-CM | POA: Diagnosis not present

## 2019-04-26 DIAGNOSIS — E559 Vitamin D deficiency, unspecified: Secondary | ICD-10-CM | POA: Diagnosis not present

## 2019-04-26 DIAGNOSIS — I2510 Atherosclerotic heart disease of native coronary artery without angina pectoris: Secondary | ICD-10-CM | POA: Diagnosis not present

## 2019-04-26 DIAGNOSIS — K219 Gastro-esophageal reflux disease without esophagitis: Secondary | ICD-10-CM | POA: Diagnosis not present

## 2019-04-26 DIAGNOSIS — R5383 Other fatigue: Secondary | ICD-10-CM | POA: Diagnosis not present

## 2019-04-26 DIAGNOSIS — E7800 Pure hypercholesterolemia, unspecified: Secondary | ICD-10-CM | POA: Diagnosis not present

## 2019-06-01 DIAGNOSIS — G5603 Carpal tunnel syndrome, bilateral upper limbs: Secondary | ICD-10-CM | POA: Diagnosis not present

## 2019-06-18 DIAGNOSIS — Z6837 Body mass index (BMI) 37.0-37.9, adult: Secondary | ICD-10-CM | POA: Diagnosis not present

## 2019-06-18 DIAGNOSIS — Z79899 Other long term (current) drug therapy: Secondary | ICD-10-CM | POA: Diagnosis not present

## 2019-06-18 DIAGNOSIS — E8881 Metabolic syndrome: Secondary | ICD-10-CM | POA: Diagnosis not present

## 2019-06-18 DIAGNOSIS — K5900 Constipation, unspecified: Secondary | ICD-10-CM | POA: Diagnosis not present

## 2019-06-18 DIAGNOSIS — F900 Attention-deficit hyperactivity disorder, predominantly inattentive type: Secondary | ICD-10-CM | POA: Diagnosis not present

## 2019-07-07 DIAGNOSIS — M25531 Pain in right wrist: Secondary | ICD-10-CM | POA: Diagnosis not present

## 2019-07-07 DIAGNOSIS — M79641 Pain in right hand: Secondary | ICD-10-CM | POA: Diagnosis not present

## 2019-07-07 DIAGNOSIS — M79642 Pain in left hand: Secondary | ICD-10-CM | POA: Diagnosis not present

## 2019-07-07 DIAGNOSIS — M25532 Pain in left wrist: Secondary | ICD-10-CM | POA: Diagnosis not present

## 2019-07-09 DIAGNOSIS — M79641 Pain in right hand: Secondary | ICD-10-CM | POA: Insufficient documentation

## 2019-07-09 DIAGNOSIS — M79642 Pain in left hand: Secondary | ICD-10-CM | POA: Insufficient documentation

## 2019-07-09 DIAGNOSIS — M25531 Pain in right wrist: Secondary | ICD-10-CM | POA: Insufficient documentation

## 2019-07-15 DIAGNOSIS — R200 Anesthesia of skin: Secondary | ICD-10-CM | POA: Diagnosis not present

## 2019-07-15 DIAGNOSIS — G5603 Carpal tunnel syndrome, bilateral upper limbs: Secondary | ICD-10-CM | POA: Diagnosis not present

## 2019-07-15 DIAGNOSIS — R202 Paresthesia of skin: Secondary | ICD-10-CM | POA: Diagnosis not present

## 2019-07-19 DIAGNOSIS — R7303 Prediabetes: Secondary | ICD-10-CM | POA: Diagnosis not present

## 2019-07-19 DIAGNOSIS — E8881 Metabolic syndrome: Secondary | ICD-10-CM | POA: Diagnosis not present

## 2019-07-19 DIAGNOSIS — E538 Deficiency of other specified B group vitamins: Secondary | ICD-10-CM | POA: Diagnosis not present

## 2019-07-19 DIAGNOSIS — Z6837 Body mass index (BMI) 37.0-37.9, adult: Secondary | ICD-10-CM | POA: Diagnosis not present

## 2019-07-19 DIAGNOSIS — E559 Vitamin D deficiency, unspecified: Secondary | ICD-10-CM | POA: Diagnosis not present

## 2019-07-19 DIAGNOSIS — F419 Anxiety disorder, unspecified: Secondary | ICD-10-CM | POA: Diagnosis not present

## 2019-07-19 DIAGNOSIS — E7800 Pure hypercholesterolemia, unspecified: Secondary | ICD-10-CM | POA: Diagnosis not present

## 2019-07-19 DIAGNOSIS — R5383 Other fatigue: Secondary | ICD-10-CM | POA: Diagnosis not present

## 2019-07-19 DIAGNOSIS — Z79899 Other long term (current) drug therapy: Secondary | ICD-10-CM | POA: Diagnosis not present

## 2019-07-19 DIAGNOSIS — R5381 Other malaise: Secondary | ICD-10-CM | POA: Diagnosis not present

## 2019-07-19 DIAGNOSIS — E282 Polycystic ovarian syndrome: Secondary | ICD-10-CM | POA: Diagnosis not present

## 2019-07-19 DIAGNOSIS — F900 Attention-deficit hyperactivity disorder, predominantly inattentive type: Secondary | ICD-10-CM | POA: Diagnosis not present

## 2019-07-19 DIAGNOSIS — Z8249 Family history of ischemic heart disease and other diseases of the circulatory system: Secondary | ICD-10-CM | POA: Diagnosis not present

## 2019-10-14 DIAGNOSIS — Z03818 Encounter for observation for suspected exposure to other biological agents ruled out: Secondary | ICD-10-CM | POA: Diagnosis not present

## 2019-10-21 DIAGNOSIS — D519 Vitamin B12 deficiency anemia, unspecified: Secondary | ICD-10-CM | POA: Diagnosis not present

## 2019-10-21 DIAGNOSIS — E785 Hyperlipidemia, unspecified: Secondary | ICD-10-CM | POA: Diagnosis not present

## 2019-10-21 DIAGNOSIS — E559 Vitamin D deficiency, unspecified: Secondary | ICD-10-CM | POA: Diagnosis not present

## 2019-10-21 DIAGNOSIS — R002 Palpitations: Secondary | ICD-10-CM | POA: Diagnosis not present

## 2019-10-21 DIAGNOSIS — E538 Deficiency of other specified B group vitamins: Secondary | ICD-10-CM | POA: Diagnosis not present

## 2019-10-21 DIAGNOSIS — E7800 Pure hypercholesterolemia, unspecified: Secondary | ICD-10-CM | POA: Diagnosis not present

## 2019-10-21 DIAGNOSIS — Z716 Tobacco abuse counseling: Secondary | ICD-10-CM | POA: Diagnosis not present

## 2019-10-21 DIAGNOSIS — L301 Dyshidrosis [pompholyx]: Secondary | ICD-10-CM | POA: Diagnosis not present

## 2019-10-21 DIAGNOSIS — Z79899 Other long term (current) drug therapy: Secondary | ICD-10-CM | POA: Diagnosis not present

## 2019-10-21 DIAGNOSIS — F17210 Nicotine dependence, cigarettes, uncomplicated: Secondary | ICD-10-CM | POA: Diagnosis not present

## 2019-10-21 DIAGNOSIS — J449 Chronic obstructive pulmonary disease, unspecified: Secondary | ICD-10-CM | POA: Diagnosis not present

## 2019-10-21 DIAGNOSIS — I2510 Atherosclerotic heart disease of native coronary artery without angina pectoris: Secondary | ICD-10-CM | POA: Diagnosis not present

## 2019-10-21 DIAGNOSIS — R5383 Other fatigue: Secondary | ICD-10-CM | POA: Diagnosis not present

## 2019-10-21 DIAGNOSIS — I10 Essential (primary) hypertension: Secondary | ICD-10-CM | POA: Diagnosis not present

## 2019-11-22 DIAGNOSIS — M009 Pyogenic arthritis, unspecified: Secondary | ICD-10-CM | POA: Diagnosis not present

## 2019-11-23 DIAGNOSIS — Z79899 Other long term (current) drug therapy: Secondary | ICD-10-CM | POA: Diagnosis not present

## 2019-12-09 DIAGNOSIS — Z419 Encounter for procedure for purposes other than remedying health state, unspecified: Secondary | ICD-10-CM | POA: Diagnosis not present

## 2019-12-27 DIAGNOSIS — J019 Acute sinusitis, unspecified: Secondary | ICD-10-CM | POA: Diagnosis not present

## 2019-12-27 DIAGNOSIS — R0981 Nasal congestion: Secondary | ICD-10-CM | POA: Diagnosis not present

## 2019-12-27 DIAGNOSIS — R4921 Hypernasality: Secondary | ICD-10-CM | POA: Diagnosis not present

## 2019-12-27 DIAGNOSIS — H04201 Unspecified epiphora, right lacrimal gland: Secondary | ICD-10-CM | POA: Diagnosis not present

## 2019-12-28 DIAGNOSIS — H5213 Myopia, bilateral: Secondary | ICD-10-CM | POA: Diagnosis not present

## 2020-01-04 DIAGNOSIS — R946 Abnormal results of thyroid function studies: Secondary | ICD-10-CM | POA: Diagnosis not present

## 2020-01-04 DIAGNOSIS — M797 Fibromyalgia: Secondary | ICD-10-CM | POA: Diagnosis not present

## 2020-01-04 DIAGNOSIS — E538 Deficiency of other specified B group vitamins: Secondary | ICD-10-CM | POA: Diagnosis not present

## 2020-01-04 DIAGNOSIS — F419 Anxiety disorder, unspecified: Secondary | ICD-10-CM | POA: Diagnosis not present

## 2020-01-04 DIAGNOSIS — E039 Hypothyroidism, unspecified: Secondary | ICD-10-CM | POA: Diagnosis not present

## 2020-01-04 DIAGNOSIS — F900 Attention-deficit hyperactivity disorder, predominantly inattentive type: Secondary | ICD-10-CM | POA: Diagnosis not present

## 2020-01-09 DIAGNOSIS — Z419 Encounter for procedure for purposes other than remedying health state, unspecified: Secondary | ICD-10-CM | POA: Diagnosis not present

## 2020-01-20 DIAGNOSIS — Z01419 Encounter for gynecological examination (general) (routine) without abnormal findings: Secondary | ICD-10-CM | POA: Diagnosis not present

## 2020-01-20 DIAGNOSIS — Z124 Encounter for screening for malignant neoplasm of cervix: Secondary | ICD-10-CM | POA: Diagnosis not present

## 2020-01-20 DIAGNOSIS — Z1151 Encounter for screening for human papillomavirus (HPV): Secondary | ICD-10-CM | POA: Diagnosis not present

## 2020-01-21 DIAGNOSIS — M797 Fibromyalgia: Secondary | ICD-10-CM | POA: Diagnosis not present

## 2020-01-21 DIAGNOSIS — E559 Vitamin D deficiency, unspecified: Secondary | ICD-10-CM | POA: Diagnosis not present

## 2020-01-21 DIAGNOSIS — Z6837 Body mass index (BMI) 37.0-37.9, adult: Secondary | ICD-10-CM | POA: Diagnosis not present

## 2020-01-21 DIAGNOSIS — F17210 Nicotine dependence, cigarettes, uncomplicated: Secondary | ICD-10-CM | POA: Diagnosis not present

## 2020-01-21 DIAGNOSIS — R5383 Other fatigue: Secondary | ICD-10-CM | POA: Diagnosis not present

## 2020-01-21 DIAGNOSIS — F419 Anxiety disorder, unspecified: Secondary | ICD-10-CM | POA: Diagnosis not present

## 2020-01-21 DIAGNOSIS — F900 Attention-deficit hyperactivity disorder, predominantly inattentive type: Secondary | ICD-10-CM | POA: Diagnosis not present

## 2020-01-21 DIAGNOSIS — E039 Hypothyroidism, unspecified: Secondary | ICD-10-CM | POA: Diagnosis not present

## 2020-01-21 DIAGNOSIS — E7800 Pure hypercholesterolemia, unspecified: Secondary | ICD-10-CM | POA: Diagnosis not present

## 2020-02-07 DIAGNOSIS — R102 Pelvic and perineal pain: Secondary | ICD-10-CM | POA: Diagnosis not present

## 2020-02-07 DIAGNOSIS — N946 Dysmenorrhea, unspecified: Secondary | ICD-10-CM | POA: Diagnosis not present

## 2020-02-07 DIAGNOSIS — M79672 Pain in left foot: Secondary | ICD-10-CM | POA: Diagnosis not present

## 2020-02-09 DIAGNOSIS — Z419 Encounter for procedure for purposes other than remedying health state, unspecified: Secondary | ICD-10-CM | POA: Diagnosis not present

## 2020-02-10 DIAGNOSIS — S9032XA Contusion of left foot, initial encounter: Secondary | ICD-10-CM | POA: Diagnosis not present

## 2020-02-10 DIAGNOSIS — M79672 Pain in left foot: Secondary | ICD-10-CM | POA: Diagnosis not present

## 2020-02-10 DIAGNOSIS — S90122A Contusion of left lesser toe(s) without damage to nail, initial encounter: Secondary | ICD-10-CM | POA: Diagnosis not present

## 2020-02-10 DIAGNOSIS — M778 Other enthesopathies, not elsewhere classified: Secondary | ICD-10-CM | POA: Diagnosis not present

## 2020-02-18 DIAGNOSIS — R102 Pelvic and perineal pain: Secondary | ICD-10-CM | POA: Diagnosis not present

## 2020-02-21 DIAGNOSIS — Z01818 Encounter for other preprocedural examination: Secondary | ICD-10-CM | POA: Diagnosis not present

## 2020-02-21 DIAGNOSIS — N946 Dysmenorrhea, unspecified: Secondary | ICD-10-CM | POA: Diagnosis not present

## 2020-02-21 DIAGNOSIS — R102 Pelvic and perineal pain: Secondary | ICD-10-CM | POA: Diagnosis not present

## 2020-02-21 DIAGNOSIS — N938 Other specified abnormal uterine and vaginal bleeding: Secondary | ICD-10-CM | POA: Diagnosis not present

## 2020-03-02 DIAGNOSIS — Z9889 Other specified postprocedural states: Secondary | ICD-10-CM | POA: Diagnosis not present

## 2020-03-27 DIAGNOSIS — L600 Ingrowing nail: Secondary | ICD-10-CM | POA: Diagnosis not present

## 2020-03-27 DIAGNOSIS — M7752 Other enthesopathy of left foot: Secondary | ICD-10-CM | POA: Diagnosis not present

## 2020-03-27 DIAGNOSIS — M778 Other enthesopathies, not elsewhere classified: Secondary | ICD-10-CM | POA: Diagnosis not present

## 2020-04-10 DIAGNOSIS — Z419 Encounter for procedure for purposes other than remedying health state, unspecified: Secondary | ICD-10-CM | POA: Diagnosis not present

## 2020-05-03 DIAGNOSIS — M797 Fibromyalgia: Secondary | ICD-10-CM | POA: Diagnosis not present

## 2020-05-03 DIAGNOSIS — E559 Vitamin D deficiency, unspecified: Secondary | ICD-10-CM | POA: Diagnosis not present

## 2020-05-03 DIAGNOSIS — J449 Chronic obstructive pulmonary disease, unspecified: Secondary | ICD-10-CM | POA: Diagnosis not present

## 2020-05-03 DIAGNOSIS — E039 Hypothyroidism, unspecified: Secondary | ICD-10-CM | POA: Diagnosis not present

## 2020-05-03 DIAGNOSIS — F419 Anxiety disorder, unspecified: Secondary | ICD-10-CM | POA: Diagnosis not present

## 2020-05-03 DIAGNOSIS — Z23 Encounter for immunization: Secondary | ICD-10-CM | POA: Diagnosis not present

## 2020-05-03 DIAGNOSIS — E7800 Pure hypercholesterolemia, unspecified: Secondary | ICD-10-CM | POA: Diagnosis not present

## 2020-05-10 DIAGNOSIS — Z419 Encounter for procedure for purposes other than remedying health state, unspecified: Secondary | ICD-10-CM | POA: Diagnosis not present

## 2020-06-10 DIAGNOSIS — Z419 Encounter for procedure for purposes other than remedying health state, unspecified: Secondary | ICD-10-CM | POA: Diagnosis not present

## 2020-06-20 DIAGNOSIS — N6325 Unspecified lump in the left breast, overlapping quadrants: Secondary | ICD-10-CM | POA: Diagnosis not present

## 2020-06-21 ENCOUNTER — Other Ambulatory Visit: Payer: Self-pay | Admitting: Certified Nurse Midwife

## 2020-06-21 DIAGNOSIS — N6325 Unspecified lump in the left breast, overlapping quadrants: Secondary | ICD-10-CM

## 2020-06-27 ENCOUNTER — Other Ambulatory Visit: Payer: Self-pay

## 2020-06-27 ENCOUNTER — Ambulatory Visit
Admission: RE | Admit: 2020-06-27 | Discharge: 2020-06-27 | Disposition: A | Discharge status: Home / Self Care | Payer: Medicaid Other | Source: Ambulatory Visit | Attending: Certified Nurse Midwife | Admitting: Certified Nurse Midwife

## 2020-06-27 DIAGNOSIS — N6325 Unspecified lump in the left breast, overlapping quadrants: Secondary | ICD-10-CM | POA: Insufficient documentation

## 2020-06-27 DIAGNOSIS — N6323 Unspecified lump in the left breast, lower outer quadrant: Secondary | ICD-10-CM | POA: Diagnosis not present

## 2020-06-27 DIAGNOSIS — N6324 Unspecified lump in the left breast, lower inner quadrant: Secondary | ICD-10-CM | POA: Diagnosis not present

## 2020-06-27 DIAGNOSIS — R928 Other abnormal and inconclusive findings on diagnostic imaging of breast: Secondary | ICD-10-CM | POA: Diagnosis not present

## 2020-06-29 ENCOUNTER — Other Ambulatory Visit: Payer: Self-pay | Admitting: Certified Nurse Midwife

## 2020-06-29 DIAGNOSIS — R928 Other abnormal and inconclusive findings on diagnostic imaging of breast: Secondary | ICD-10-CM

## 2020-06-29 DIAGNOSIS — N6320 Unspecified lump in the left breast, unspecified quadrant: Secondary | ICD-10-CM

## 2020-07-03 ENCOUNTER — Other Ambulatory Visit: Payer: Self-pay

## 2020-07-03 ENCOUNTER — Ambulatory Visit
Admission: RE | Admit: 2020-07-03 | Discharge: 2020-07-03 | Disposition: A | Discharge status: Home / Self Care | Payer: Medicaid Other | Source: Ambulatory Visit | Attending: Certified Nurse Midwife | Admitting: Certified Nurse Midwife

## 2020-07-03 DIAGNOSIS — N6320 Unspecified lump in the left breast, unspecified quadrant: Secondary | ICD-10-CM

## 2020-07-03 DIAGNOSIS — R928 Other abnormal and inconclusive findings on diagnostic imaging of breast: Secondary | ICD-10-CM

## 2020-07-03 DIAGNOSIS — N6489 Other specified disorders of breast: Secondary | ICD-10-CM | POA: Diagnosis not present

## 2020-07-03 DIAGNOSIS — N6324 Unspecified lump in the left breast, lower inner quadrant: Secondary | ICD-10-CM | POA: Diagnosis not present

## 2020-07-03 DIAGNOSIS — N6342 Unspecified lump in left breast, subareolar: Secondary | ICD-10-CM | POA: Diagnosis not present

## 2020-07-03 HISTORY — PX: BREAST BIOPSY: SHX20

## 2020-07-04 LAB — SURGICAL PATHOLOGY

## 2020-07-05 ENCOUNTER — Encounter: Payer: Self-pay | Admitting: *Deleted

## 2020-07-05 NOTE — Progress Notes (Signed)
Received message from Electa Sniff, RN at [facility] that patient had her benign biopsy results but was requesting a surgical consult for her breast pain.  I spoke to the patient today.  She would like a referral to [facility] or [facility] for her consult.  She is going to call her insurance to which provider is covered under her policy.  I have notified Manuela Schwartz at [facility], NP's office, the patient's PCP, that the patient will call them tomorrow with who she would like to be referred to.  I also sent them the path results and mammo results via Epic.  The office will call myself or Al Pimple, RN if they do not receive those results.

## 2020-07-07 NOTE — Progress Notes (Signed)
PAtient called with questions regarding surgical consult at [facility].  Given number to [facility].  Patient is also going to call her OB/GYN Linda Hedges CNM on Monday to help with referral.

## 2020-07-10 DIAGNOSIS — J449 Chronic obstructive pulmonary disease, unspecified: Secondary | ICD-10-CM | POA: Diagnosis not present

## 2020-07-10 DIAGNOSIS — N644 Mastodynia: Secondary | ICD-10-CM | POA: Diagnosis not present

## 2020-07-10 DIAGNOSIS — N6320 Unspecified lump in the left breast, unspecified quadrant: Secondary | ICD-10-CM | POA: Diagnosis not present

## 2020-07-11 DIAGNOSIS — Z419 Encounter for procedure for purposes other than remedying health state, unspecified: Secondary | ICD-10-CM | POA: Diagnosis not present

## 2020-08-02 DIAGNOSIS — M797 Fibromyalgia: Secondary | ICD-10-CM | POA: Diagnosis not present

## 2020-08-02 DIAGNOSIS — E8881 Metabolic syndrome: Secondary | ICD-10-CM | POA: Diagnosis not present

## 2020-08-02 DIAGNOSIS — E559 Vitamin D deficiency, unspecified: Secondary | ICD-10-CM | POA: Diagnosis not present

## 2020-08-02 DIAGNOSIS — E039 Hypothyroidism, unspecified: Secondary | ICD-10-CM | POA: Diagnosis not present

## 2020-08-02 DIAGNOSIS — R7303 Prediabetes: Secondary | ICD-10-CM | POA: Diagnosis not present

## 2020-08-02 DIAGNOSIS — J449 Chronic obstructive pulmonary disease, unspecified: Secondary | ICD-10-CM | POA: Diagnosis not present

## 2020-08-02 DIAGNOSIS — E7800 Pure hypercholesterolemia, unspecified: Secondary | ICD-10-CM | POA: Diagnosis not present

## 2020-08-08 DIAGNOSIS — Z419 Encounter for procedure for purposes other than remedying health state, unspecified: Secondary | ICD-10-CM | POA: Diagnosis not present

## 2020-08-29 DIAGNOSIS — F17210 Nicotine dependence, cigarettes, uncomplicated: Secondary | ICD-10-CM | POA: Insufficient documentation

## 2020-08-29 DIAGNOSIS — F17200 Nicotine dependence, unspecified, uncomplicated: Secondary | ICD-10-CM | POA: Insufficient documentation

## 2020-09-04 DIAGNOSIS — Z803 Family history of malignant neoplasm of breast: Secondary | ICD-10-CM | POA: Diagnosis not present

## 2020-09-04 DIAGNOSIS — N6325 Unspecified lump in the left breast, overlapping quadrants: Secondary | ICD-10-CM | POA: Diagnosis not present

## 2020-09-08 DIAGNOSIS — Z419 Encounter for procedure for purposes other than remedying health state, unspecified: Secondary | ICD-10-CM | POA: Diagnosis not present

## 2020-09-20 DIAGNOSIS — W64XXXA Exposure to other animate mechanical forces, initial encounter: Secondary | ICD-10-CM | POA: Diagnosis not present

## 2020-09-20 DIAGNOSIS — J449 Chronic obstructive pulmonary disease, unspecified: Secondary | ICD-10-CM | POA: Diagnosis not present

## 2020-09-21 DIAGNOSIS — N6082 Other benign mammary dysplasias of left breast: Secondary | ICD-10-CM | POA: Diagnosis not present

## 2020-09-21 DIAGNOSIS — N6012 Diffuse cystic mastopathy of left breast: Secondary | ICD-10-CM | POA: Diagnosis not present

## 2020-09-21 DIAGNOSIS — N6320 Unspecified lump in the left breast, unspecified quadrant: Secondary | ICD-10-CM | POA: Diagnosis not present

## 2020-09-21 DIAGNOSIS — N6489 Other specified disorders of breast: Secondary | ICD-10-CM | POA: Diagnosis not present

## 2020-09-21 DIAGNOSIS — F17210 Nicotine dependence, cigarettes, uncomplicated: Secondary | ICD-10-CM | POA: Diagnosis not present

## 2020-09-21 DIAGNOSIS — Z803 Family history of malignant neoplasm of breast: Secondary | ICD-10-CM | POA: Diagnosis not present

## 2020-09-21 DIAGNOSIS — N6022 Fibroadenosis of left breast: Secondary | ICD-10-CM | POA: Diagnosis not present

## 2020-10-02 ENCOUNTER — Emergency Department
Admission: EM | Admit: 2020-10-02 | Discharge: 2020-10-02 | Disposition: A | Discharge status: Home / Self Care | Payer: Medicaid Other | Attending: Emergency Medicine | Admitting: Emergency Medicine

## 2020-10-02 ENCOUNTER — Other Ambulatory Visit: Payer: Self-pay

## 2020-10-02 ENCOUNTER — Emergency Department: Payer: Medicaid Other

## 2020-10-02 DIAGNOSIS — S51811A Laceration without foreign body of right forearm, initial encounter: Secondary | ICD-10-CM | POA: Diagnosis not present

## 2020-10-02 DIAGNOSIS — W540XXA Bitten by dog, initial encounter: Secondary | ICD-10-CM | POA: Diagnosis not present

## 2020-10-02 DIAGNOSIS — F17210 Nicotine dependence, cigarettes, uncomplicated: Secondary | ICD-10-CM | POA: Insufficient documentation

## 2020-10-02 DIAGNOSIS — S51851A Open bite of right forearm, initial encounter: Secondary | ICD-10-CM | POA: Diagnosis not present

## 2020-10-02 DIAGNOSIS — Z23 Encounter for immunization: Secondary | ICD-10-CM | POA: Insufficient documentation

## 2020-10-02 DIAGNOSIS — S59911A Unspecified injury of right forearm, initial encounter: Secondary | ICD-10-CM | POA: Diagnosis present

## 2020-10-02 MED ORDER — ACETAMINOPHEN 325 MG PO TABS
650.0000 mg | ORAL_TABLET | Freq: Once | ORAL | Status: AC
  Administered 2020-10-02: 650 mg via ORAL
  Filled 2020-10-02: qty 2

## 2020-10-02 MED ORDER — OXYCODONE-ACETAMINOPHEN 5-325 MG PO TABS: 1.0000 | ORAL_TABLET | ORAL | 0 refills | Status: DC | PRN

## 2020-10-02 MED ORDER — SULFAMETHOXAZOLE-TRIMETHOPRIM 800-160 MG PO TABS
1.0000 | ORAL_TABLET | Freq: Once | ORAL | Status: AC
  Administered 2020-10-02: 1 via ORAL
  Filled 2020-10-02: qty 1

## 2020-10-02 MED ORDER — CLINDAMYCIN HCL 300 MG PO CAPS: 300.0000 mg | ORAL_CAPSULE | Freq: Three times a day (TID) | ORAL | 0 refills | Status: AC

## 2020-10-02 MED ORDER — SULFAMETHOXAZOLE-TRIMETHOPRIM 800-160 MG PO TABS: 1.0000 | ORAL_TABLET | Freq: Two times a day (BID) | ORAL | 0 refills | Status: AC

## 2020-10-02 MED ORDER — CLINDAMYCIN HCL 150 MG PO CAPS
300.0000 mg | ORAL_CAPSULE | Freq: Once | ORAL | Status: AC
  Administered 2020-10-02: 300 mg via ORAL
  Filled 2020-10-02: qty 2

## 2020-10-02 MED ORDER — LIDOCAINE HCL (PF) 1 % IJ SOLN
5.0000 mL | Freq: Once | INTRAMUSCULAR | Status: AC
  Administered 2020-10-02: 5 mL via INTRADERMAL
  Filled 2020-10-02: qty 5

## 2020-10-02 MED ORDER — ONDANSETRON 4 MG PO TBDP
4.0000 mg | ORAL_TABLET | Freq: Once | ORAL | Status: AC
  Administered 2020-10-02: 4 mg via ORAL
  Filled 2020-10-02: qty 1

## 2020-10-02 MED ORDER — OXYCODONE-ACETAMINOPHEN 5-325 MG PO TABS
1.0000 | ORAL_TABLET | Freq: Once | ORAL | Status: AC
  Administered 2020-10-02: 1 via ORAL
  Filled 2020-10-02: qty 1

## 2020-10-02 MED ORDER — ONDANSETRON 4 MG PO TBDP: 4.0000 mg | ORAL_TABLET | Freq: Three times a day (TID) | ORAL | 0 refills | Status: DC | PRN

## 2020-10-02 MED ORDER — TETANUS-DIPHTH-ACELL PERTUSSIS 5-2.5-18.5 LF-MCG/0.5 IM SUSY
0.5000 mL | PREFILLED_SYRINGE | Freq: Once | INTRAMUSCULAR | Status: AC
  Administered 2020-10-02: 0.5 mL via INTRAMUSCULAR
  Filled 2020-10-02: qty 0.5

## 2020-10-02 NOTE — ED Provider Notes (Signed)
[facility] Emergency Department Provider Note  ____________________________________________   Event Date/Time   First MD Initiated Contact with Patient 10/02/20 2121     (approximate)  I have reviewed the triage vital signs and the nursing notes.   HISTORY  Chief Complaint Laceration  HPI Megan Bennett is a 35 y.o. female who presents to the emergency department today for evaluation of dog bite to the right forearm.  Patient states that this is her family dog that is approximately 14 years old, never been aggressive.  She is that she was trying to go out to where her husband was grilling and the dog was trying to join when she tried to push the dog from his chest back into the home and he bit her on her right forearm.  She states that the dogs last rabies vaccine was 4 years ago.  She states that the dog is always walked on the leash by herself outside and has not been in contact with other animals.  She denies any pain into the wrist or digits, denies pain into the elbow.       Past Medical History:  Diagnosis Date  . ADD (attention deficit disorder)   . Arthritis   . Fibromyalgia   . GERD (gastroesophageal reflux disease)   . Headache    MIGRAINES    Patient Active Problem List   Diagnosis Date Noted  . Postoperative complication [id number]  . S/P laparoscopic surgery 08/04/2018  . Small bowel perforation, intraoperative 08/04/2018    Past Surgical History:  Procedure Laterality Date  . BREAST BIOPSY Left 07/03/2020   [place] bx, q marker, path pending  . ESOPHAGOGASTRODUODENOSCOPY    . LAPAROSCOPIC TUBAL LIGATION Bilateral 08/03/2018   Procedure: LAPAROSCOPIC TUBAL LIGATION;  Surgeon: Ward, Honor Loh, MD;  Location: [facility] ORS;  Service: Gynecology;  Laterality: Bilateral;  . LAPAROSCOPY N/A 08/04/2018   Procedure: LAPAROSCOPY DIAGNOSTIC;  Surgeon: Ward, Honor Loh, MD;  Location: [facility] ORS;  Service: Gynecology;  Laterality: N/A;  . SMALL  BOWEL REPAIR N/A 08/04/2018   Procedure: SMALL BOWEL REPAIR;  Surgeon: Ward, Honor Loh, MD;  Location: [facility] ORS;  Service: Gynecology;  Laterality: N/A;  . WISDOM TOOTH EXTRACTION Bilateral     Prior to Admission medications   Medication Sig Start Date End Date Taking? Authorizing Provider  clindamycin (CLEOCIN) 300 MG capsule Take 1 capsule (300 mg total) by mouth 3 (three) times daily for 10 days. 10/02/20 10/12/20 Yes Leigh Kaeding, Farrel Gordon, PA  ondansetron (ZOFRAN ODT) 4 MG disintegrating tablet Take 1 tablet (4 mg total) by mouth every 8 (eight) hours as needed for nausea or vomiting. 10/02/20  Yes Marlana Salvage, PA  oxyCODONE-acetaminophen (PERCOCET) 5-325 MG tablet Take 1 tablet by mouth every 4 (four) hours as needed for severe pain. 10/02/20 10/02/21 Yes Aymee Fomby, Farrel Gordon, PA  sulfamethoxazole-trimethoprim (BACTRIM DS) 800-160 MG tablet Take 1 tablet by mouth 2 (two) times daily for 10 days. 10/02/20 10/12/20 Yes Marlana Salvage, PA  acetaminophen (TYLENOL) 500 MG tablet Take 2 tablets (1,000 mg total) by mouth every 6 (six) hours as needed (for pain scale < 4). 06/09/18   McVey, Murray Hodgkins, CNM  docusate sodium (COLACE) 100 MG capsule Take 1 capsule (100 mg total) by mouth 2 (two) times daily. Patient not taking: Reported on 07/27/2018 06/09/18   McVey, Murray Hodgkins, CNM  ibuprofen (ADVIL,MOTRIN) 600 MG tablet Take 1 tablet (600 mg total) by mouth every 6 (six) hours. 08/12/18   Ward,  Chelsea C, MD  pantoprazole (PROTONIX) 40 MG tablet Take 1 tablet (40 mg total) by mouth daily. 08/13/18 11/11/18  Ward, Honor Loh, MD  Prenatal Vit-Fe Fumarate-FA (PREPLUS) 27-1 MG TABS Take 1 tablet by mouth daily. 06/11/18   [provider]    Allergies Amoxicillin and Wellbutrin [bupropion]  Family History  Problem Relation Age of Onset  . Breast cancer Maternal Aunt   . Breast cancer Paternal Aunt     Social History Social History   Tobacco Use  . Smoking status: Current Every Day Smoker     Packs/day: 0.25    Years: 20.00    Pack years: 5.00    Types: Cigarettes  . Smokeless tobacco: Never Used  Vaping Use  . Vaping Use: Never used  Substance Use Topics  . Alcohol use: Yes    Alcohol/week: 1.0 standard drink    Types: 1 Glasses of wine per week    Comment: OCC  . Drug use: Never    Review of Systems Constitutional: No fever/chills Eyes: No visual changes. ENT: No sore throat. Cardiovascular: Denies chest pain. Respiratory: Denies shortness of breath. Gastrointestinal: No abdominal pain.  No nausea, no vomiting.  No diarrhea.  No constipation. Genitourinary: Negative for dysuria. Musculoskeletal:+ Right forearm pain, negative for back pain. Skin: + Dog bite wound to the right forearm, negative for rash. Neurological: Negative for headaches, focal weakness or numbness.   ____________________________________________   PHYSICAL EXAM:  VITAL SIGNS: ED Triage Vitals [10/02/20 2012]  Enc Vitals Group     BP 121/86     Pulse Rate 97     Resp 20     Temp 98.4 F (36.9 C)     Temp Source Oral     SpO2 99 %     Weight 270 lb (122.5 kg)     Height 5\' 7"  (1.702 m)     Head Circumference      Peak Flow      Pain Score 9     Pain Loc      Pain Edu?      Excl. in [place]?     Constitutional: Alert and oriented. Well appearing and in no acute distress. Eyes: Conjunctivae are normal. PERRL. EOMI. Head: Atraumatic. Nose: No congestion/rhinnorhea. Mouth/Throat: Mucous membranes are moist. . Neck: No stridor.   Cardiovascular: Normal rate, regular rhythm. Grossly normal heart sounds.  Good peripheral circulation. Respiratory: Normal respiratory effort.  No retractions. Lungs CTAB. Musculoskeletal: There is full range of motion of the elbow wrist and digits.  Capillary refill less than 3 seconds all digits, radial pulse 2+.  There is ecchymosis at the proximal forearm, mostly on the lateral aspect at site of dog bite and laceration.  See below for details. Neurologic:   Normal speech and language. No gross focal neurologic deficits are appreciated. No gait instability. Skin: There is a 2 to 3 inch laceration on the proximal anterior forearm.  She also has a small superficial half inch abrasion type of wound to the lateral aspect of the proximal forearm. Psychiatric: Mood and affect are normal. Speech and behavior are normal.   ____________________________________________  RADIOLOGY I, Marlana Salvage, personally viewed and evaluated these images (plain radiographs) as part of my medical decision making, as well as reviewing the written report by the radiologist.  ED provider interpretation: X-ray of the right forearm does not reveal any underlying fracture or obvious foreign body.  There is soft tissue air seen in the proximal forearm at site  of laceration.  _______________________________________   PROCEDURES  Procedure(s) performed (including Critical Care):  Marland KitchenMarland KitchenLaceration Repair  Date/Time: 10/04/2020 5:15 PM Performed by: Marlana Salvage, PA Authorized by: Marlana Salvage, PA   Consent:    Consent obtained:  Verbal   Consent given by:  Patient   Risks, benefits, and alternatives were discussed: yes     Risks discussed:  Infection, need for additional repair, poor wound healing, poor cosmetic result, pain and retained foreign body   Alternatives discussed:  No treatment and observation Universal protocol:    Procedure explained and questions answered to patient or proxy's satisfaction: yes     Imaging studies available: yes     Patient identity confirmed:  Verbally with patient Anesthesia:    Anesthesia method:  Local infiltration   Local anesthetic:  Lidocaine 1% w/o epi Laceration details:    Location:  Shoulder/arm   Shoulder/arm location:  R lower arm   Length (cm):  5   Depth (mm):  8 Pre-procedure details:    Preparation:  Patient was prepped and draped in usual sterile fashion Exploration:    Imaging obtained: x-ray      Imaging outcome: foreign body not noted     Wound exploration: wound explored through full range of motion and entire depth of wound visualized   Treatment:    Area cleansed with:  Povidone-iodine and saline   Amount of cleaning:  Extensive   Irrigation solution:  Sterile saline   Irrigation method:  Syringe and tap Skin repair:    Repair method:  Sutures   Suture size:  4-0   Suture material:  Nylon   Suture technique:  Simple interrupted   Number of sutures:  6 Approximation:    Approximation:  Loose Repair type:    Repair type:  Simple Post-procedure details:    Dressing:  Non-adherent dressing   Procedure completion:  Tolerated well, no immediate complications     ____________________________________________   INITIAL IMPRESSION / ASSESSMENT AND PLAN / ED COURSE  As part of my medical decision making, I reviewed the following data within the electronic MEDICAL RECORD NUMBER Nursing notes reviewed and incorporated, Radiograph reviewed and Notes from prior ED visits        Patient is a 35 year old female who presents to the emergency department after dog bite by her own family dog earlier today.  This has been reported to the [facility] department.  She does not believe the dog is up-to-date on his rabies vaccine, but assures that there has been no other contact with any other animals.  See HPI for further details.  On physical exam, there is a dog bite wound to the anterior aspect of the proximal forearm as well as small abrasion to the lateral aspect of the proximal forearm.  There is surrounding ecchymosis at the site.  Given the length of the wound with exposed adipose, risk and benefits to closure repair were discussed, and she agrees to a repair with loose approximation.  Patient's tetanus was updated.  She declines wanting rabies immunoglobulin or prophylaxis given her confidence that the dog is not rabid.  She was started on prophylactic antibiotics, however  she has an allergy to amoxicillin and thus Augmentin was not utilized.  Instead, she was initiated on course of clindamycin and Bactrim.  She was also given pain medication on a short course basis.  She was advised to utilize ice, anti-inflammatories and Tylenol.  Return precautions were discussed, particularly for infection precautions.  Patient was advised to have the sutures removed in 5 to 7 days, which can be done at primary care, urgent care or the ER.  Patient is amenable with plan, she stable this time for outpatient management.       ____________________________________________   FINAL CLINICAL IMPRESSION(S) / ED DIAGNOSES  Final diagnoses:  Dog bite, initial encounter     ED Discharge Orders         Ordered    sulfamethoxazole-trimethoprim (BACTRIM DS) 800-160 MG tablet  2 times daily        10/02/20 2310    clindamycin (CLEOCIN) 300 MG capsule  3 times daily        10/02/20 2310    oxyCODONE-acetaminophen (PERCOCET) 5-325 MG tablet  Every 4 hours PRN        10/02/20 2310    ondansetron (ZOFRAN ODT) 4 MG disintegrating tablet  Every 8 hours PRN        10/02/20 2310          *Please note:  Megan Bennett was evaluated in Emergency Department on 10/04/2020 for the symptoms described in the history of present illness. She was evaluated in the context of the global COVID-19 pandemic, which necessitated consideration that the patient might be at risk for infection with the SARS-CoV-2 virus that causes COVID-19. Institutional protocols and algorithms that pertain to the evaluation of patients at risk for COVID-19 are in a state of rapid change based on information released by regulatory bodies including the CDC and federal and state organizations. These policies and algorithms were followed during the patient's care in the ED.  Some ED evaluations and interventions may be delayed as a result of limited staffing during and the pandemic.*   Note:  This document was prepared  using Dragon voice recognition software and may include unintentional dictation errors.   Marlana Salvage, PA 10/04/20 1721    Lucrezia Starch, MD 10/05/20 [phone]

## 2020-10-02 NOTE — Discharge Instructions (Signed)
Please follow-up with primary care to have your 6 stitches removed in 7 to 10 days.  Please take the antibiotics as prescribed.

## 2020-10-02 NOTE — ED Notes (Signed)
Patient discharged to home per MD order. Patient in stable condition, and deemed medically cleared by ED provider for discharge. Discharge instructions reviewed with patient/family using "Teach Back"; verbalized understanding of medication education and administration, and information about follow-up care. Denies further concerns. ° °

## 2020-10-02 NOTE — ED Triage Notes (Signed)
Pt was bit by own dog to right forearm, approx 1.5 inch lac noted with bleeding controlled. Pt unsure if dog is utd on vaccinations.

## 2020-10-02 NOTE — ED Triage Notes (Signed)
Reported to [facility] 

## 2020-10-02 NOTE — ED Notes (Signed)
Lac cleaned and dressed with non adhering dressing. Reviewed with patient home care instructions.

## 2020-10-08 DIAGNOSIS — Z419 Encounter for procedure for purposes other than remedying health state, unspecified: Secondary | ICD-10-CM | POA: Diagnosis not present

## 2020-10-30 DIAGNOSIS — E7800 Pure hypercholesterolemia, unspecified: Secondary | ICD-10-CM | POA: Diagnosis not present

## 2020-10-30 DIAGNOSIS — G608 Other hereditary and idiopathic neuropathies: Secondary | ICD-10-CM | POA: Diagnosis not present

## 2020-10-30 DIAGNOSIS — D519 Vitamin B12 deficiency anemia, unspecified: Secondary | ICD-10-CM | POA: Diagnosis not present

## 2020-10-30 DIAGNOSIS — E559 Vitamin D deficiency, unspecified: Secondary | ICD-10-CM | POA: Diagnosis not present

## 2020-10-30 DIAGNOSIS — M79661 Pain in right lower leg: Secondary | ICD-10-CM | POA: Diagnosis not present

## 2020-10-30 DIAGNOSIS — E039 Hypothyroidism, unspecified: Secondary | ICD-10-CM | POA: Diagnosis not present

## 2020-10-30 DIAGNOSIS — G603 Idiopathic progressive neuropathy: Secondary | ICD-10-CM | POA: Diagnosis not present

## 2020-11-08 DIAGNOSIS — Z419 Encounter for procedure for purposes other than remedying health state, unspecified: Secondary | ICD-10-CM | POA: Diagnosis not present

## 2020-12-08 DIAGNOSIS — Z419 Encounter for procedure for purposes other than remedying health state, unspecified: Secondary | ICD-10-CM | POA: Diagnosis not present

## 2021-01-08 DIAGNOSIS — Z419 Encounter for procedure for purposes other than remedying health state, unspecified: Secondary | ICD-10-CM | POA: Diagnosis not present

## 2021-01-11 NOTE — Progress Notes (Signed)
BP 119/73   Pulse 60   Temp 99.1 F (37.3 C) (Oral)   Ht '5\' 7"'  (1.702 m)   Wt 284 lb 3.2 oz (128.9 kg)   SpO2 97%   BMI 44.51 kg/m    Subjective:    Patient ID: Megan Bennett, female    DOB: [birth year], 35 y.o.   MRN: [id number]  HPI: Megan Bennett is a 35 y.o. female  Chief Complaint  Patient presents with   Establish Care   Weight Loss    Patient states she would like to discuss weight loss management.    Medication Management    Patient states she would like to discuss medication management as the current prior provider she see makes her come into the office for about "2 minute visit" in order for her to get medication refills and patient states she is just not happy with prior provider.    Patient presents to clinic to establish care with new PCP.  Patient reports a history of hypothyroid, folic acid def, migraines, ADHD, fibromyalgia (taking ibuprofen q8), anxiety.  Patient was diagnosed with Fibromyalgia when she was 34.  She has tried lyrica, tramadol, flexeril.  Patient denies a history of: Hypertension, Elevated Cholesterol, Diabetes, Depression, Neurological problems, and Abdominal problems.   Patient states she would like to establish care here because she is not satisfied with her current provider.  Patient states she would like to lose weight and would like to discuss medication for that.  Patient states she had a tubal ligation and they perforated her bowel and thinks she had COVID in the hospital during that time and has had trouble breathing since then.  Never been diagnosed with asthma.  She is a current everyday smoker.  1 pack is last about 2 days. She has a hard time remembering to use the Stiolto.  Sometimes she uses the albuterol daily and then may go a couple of weeks without using it.    Patient states she has acid reflux. It makes her vomit at times.  She hasn't been taking anything but tums for the heartburn.      Active Ambulatory  Problems    Diagnosis Date Noted   Postoperative complication 50/35/4656   S/P laparoscopic surgery 08/04/2018   Small bowel perforation, intraoperative 08/04/2018   Bilateral hand pain 07/09/2019   Bilateral wrist pain 07/09/2019   Current smoker 08/29/2020   Encounter to determine fetal viability of pregnancy, fetus 1 11/17/2017   Pelvic pain in female 01/12/2021   Supervision of high risk pregnancy in second trimester 01/12/2021   Resolved Ambulatory Problems    Diagnosis Date Noted   Indication for care in labor or delivery 05/25/2018   Past Medical History:  Diagnosis Date   ADD (attention deficit disorder)    Arthritis    Fibromyalgia    GERD (gastroesophageal reflux disease)    Headache    Past Surgical History:  Procedure Laterality Date   BREAST BIOPSY Left 07/03/2020   [place] bx, q marker, path pending   ESOPHAGOGASTRODUODENOSCOPY     LAPAROSCOPIC TUBAL LIGATION Bilateral 08/03/2018   Procedure: LAPAROSCOPIC TUBAL LIGATION;  Surgeon: Ward, Honor Loh, MD;  Location: [facility];  Service: Gynecology;  Laterality: Bilateral;   LAPAROSCOPY N/A 08/04/2018   Procedure: LAPAROSCOPY DIAGNOSTIC;  Surgeon: Ward, Honor Loh, MD;  Location: [facility];  Service: Gynecology;  Laterality: N/A;   SMALL BOWEL REPAIR N/A 08/04/2018   Procedure: SMALL BOWEL REPAIR;  Surgeon: Ward, Honor Loh, MD;  Location: [facility];  Service: Gynecology;  Laterality: N/A;   WISDOM TOOTH EXTRACTION Bilateral    Family History  Problem Relation Age of Onset   ADD / ADHD Mother    Arthritis Mother    Arthritis Father    ADD / ADHD Sister    Allergies Daughter    Breast cancer Maternal Aunt    Breast cancer Paternal Aunt    Arthritis Maternal Grandmother    Arthritis Maternal Grandfather    Cancer Maternal Grandfather     Relevant past medical, surgical, family and social history reviewed and updated as indicated. Interim medical history since our last visit reviewed. Allergies and medications reviewed  and updated.  Review of Systems  Constitutional:  Positive for unexpected weight change.  Respiratory:  Positive for shortness of breath.   Psychiatric/Behavioral:  The patient is nervous/anxious.    Per HPI unless specifically indicated above     Objective:    BP 119/73   Pulse 60   Temp 99.1 F (37.3 C) (Oral)   Ht '5\' 7"'  (1.702 m)   Wt 284 lb 3.2 oz (128.9 kg)   SpO2 97%   BMI 44.51 kg/m   Wt Readings from Last 3 Encounters:  01/12/21 284 lb 3.2 oz (128.9 kg)  10/02/20 270 lb (122.5 kg)  08/04/18 220 lb 6 oz (100 kg)    Physical Exam Vitals and nursing note reviewed.  Constitutional:      General: She is not in acute distress.    Appearance: Normal appearance. She is normal weight. She is not ill-appearing, toxic-appearing or diaphoretic.  HENT:     Head: Normocephalic.     Right Ear: External ear normal.     Left Ear: External ear normal.     Nose: Nose normal.     Mouth/Throat:     Mouth: Mucous membranes are moist.     Pharynx: Oropharynx is clear.  Eyes:     General:        Right eye: No discharge.        Left eye: No discharge.     Extraocular Movements: Extraocular movements intact.     Conjunctiva/sclera: Conjunctivae normal.     Pupils: Pupils are equal, round, and reactive to light.  Cardiovascular:     Rate and Rhythm: Normal rate and regular rhythm.     Heart sounds: No murmur heard. Pulmonary:     Effort: Pulmonary effort is normal. No respiratory distress.     Breath sounds: Decreased breath sounds present. No wheezing or rales.  Musculoskeletal:     Cervical back: Normal range of motion and neck supple.  Skin:    General: Skin is warm and dry.     Capillary Refill: Capillary refill takes less than 2 seconds.  Neurological:     General: No focal deficit present.     Mental Status: She is alert and oriented to person, place, and time. Mental status is at baseline.  Psychiatric:        Mood and Affect: Mood normal.        Behavior: Behavior  normal.        Thought Content: Thought content normal.        Judgment: Judgment normal.    Results for orders placed or performed during the hospital encounter of 07/03/20  Surgical pathology  Result Value Ref Range   SURGICAL PATHOLOGY      SURGICAL PATHOLOGY CASE: [id number] PATIENT: [facility]ne [facility] Surgical Pathology Report  Specimen Submitted: A. Breast, left, 6 o'clock 1 cm from nipple; biopsy  Clinical History: Left breast with palpable mass, 3.4 cm. Impression: fibroadenoma vs phyllodes vs IMC less likely. Post- biopsy mammograms show appropriate positioning of the Q shaped biopsy marking clip at the site of biopsy in the inferior subareolar left breast.     DIAGNOSIS: A. 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 - FOCAL MILD USUAL DUCTAL HYPERPLASIA AND A FEW APOCRINE MICROCYSTS. - NEGATIVE FOR ATYPIA AND MALIGNANCY.  Comment: The cores contain mostly hypocellular fibrous stroma. Correlation with all imaging findings is recommended.  GROSS DESCRIPTION: A. Labeled: Left breast 6:00 1 cm from nipple Received: Formalin Time/date in fixa tive: Collected and placed in formalin at 8:39 AM on 07/03/2020 Cold ischemic time: Less than 1 minute Total fixation time: 12.5 hours Core pieces: 4 Size: Range from 0.6-1.4 cm in length and 0.2 cm in diameter Description: Received are cores and fragments of yellow and white fibrofatty tissue. Ink color: Blue Entirely submitted in cassettes 1-2 with 2 cores per cassette.   Final Diagnosis performed by Bryan Lemma, MD.   Electronically signed 07/04/2020 3:29:03PM The electronic signature indicates that the named Attending Pathologist has evaluated the specimen Technical component performed at [facility], [address] [place], [place] [address] Lab: [phone] Dir: Rush Farmer, MD, MMM  Professional component performed at [facility], [facility], [place], [place], [place] [address] Lab: [phone] Dir: Dellia Nims. Reuel Derby, MD       Assessment & Plan:   Problem List Items Addressed This Visit   None Visit Diagnoses     Acquired hypothyroidism    -  Primary   Labs ordered today. Will make recommendations based on lab results on whether patient needs levothyroxine or not. Discussed symptoms of hypothryoid.   Relevant Orders   TSH   T4, free   Fibromyalgia       Do not recommend patient take Ibuprofen Q8. Will discuss cymbalta/ gabapentin at next visit. If treatment unsuccessful will send to pain management.    Relevant Medications   ibuprofen (ADVIL) 800 MG tablet   Other Relevant Orders   Comp Met (CMET)   CBC w/Diff   Encounter to establish care       Weight gain       Labs ordered today. Discussed Saxenda. Does not want to do contrave. Can also refer to weight loss clinic for phentermine. Patient will decide at next visit.    Screening for ischemic heart disease       Relevant Orders   Lipid Profile        Follow up plan: Return in about 6 weeks (around 02/23/2021) for Weight Managment, thyroid.

## 2021-01-12 ENCOUNTER — Ambulatory Visit (INDEPENDENT_AMBULATORY_CARE_PROVIDER_SITE_OTHER): Payer: Medicaid Other | Admitting: Nurse Practitioner

## 2021-01-12 ENCOUNTER — Other Ambulatory Visit: Payer: Self-pay

## 2021-01-12 ENCOUNTER — Encounter: Payer: Self-pay | Admitting: Nurse Practitioner

## 2021-01-12 VITALS — BP 119/73 | HR 60 | Temp 99.1°F | Ht 67.0 in | Wt 284.2 lb

## 2021-01-12 DIAGNOSIS — M797 Fibromyalgia: Secondary | ICD-10-CM

## 2021-01-12 DIAGNOSIS — E039 Hypothyroidism, unspecified: Secondary | ICD-10-CM

## 2021-01-12 DIAGNOSIS — R635 Abnormal weight gain: Secondary | ICD-10-CM | POA: Diagnosis not present

## 2021-01-12 DIAGNOSIS — R102 Pelvic and perineal pain: Secondary | ICD-10-CM | POA: Insufficient documentation

## 2021-01-12 DIAGNOSIS — Z23 Encounter for immunization: Secondary | ICD-10-CM | POA: Diagnosis not present

## 2021-01-12 DIAGNOSIS — Z136 Encounter for screening for cardiovascular disorders: Secondary | ICD-10-CM

## 2021-01-12 DIAGNOSIS — O0992 Supervision of high risk pregnancy, unspecified, second trimester: Secondary | ICD-10-CM | POA: Insufficient documentation

## 2021-01-12 DIAGNOSIS — Z7689 Persons encountering health services in other specified circumstances: Secondary | ICD-10-CM

## 2021-01-12 MED ORDER — STIOLTO RESPIMAT 2.5-2.5 MCG/ACT IN AERS: INHALATION_SPRAY | RESPIRATORY_TRACT | 1 refills | Status: DC

## 2021-01-12 MED ORDER — PANTOPRAZOLE SODIUM 40 MG PO TBEC: 40.0000 mg | DELAYED_RELEASE_TABLET | Freq: Every day | ORAL | 2 refills | Status: DC

## 2021-01-12 MED ORDER — IBUPROFEN 800 MG PO TABS: 800.0000 mg | ORAL_TABLET | Freq: Three times a day (TID) | ORAL | 1 refills | Status: DC | PRN

## 2021-01-12 MED ORDER — PROAIR HFA 108 (90 BASE) MCG/ACT IN AERS: 2.0000 | INHALATION_SPRAY | Freq: Four times a day (QID) | RESPIRATORY_TRACT | 1 refills | Status: DC | PRN

## 2021-01-12 NOTE — Addendum Note (Signed)
Addended by: Irena Reichmann on: 01/12/2021 09:05 AM   Modules accepted: Orders

## 2021-01-13 LAB — COMPREHENSIVE METABOLIC PANEL
ALT: 15 IU/L (ref 0–32)
AST: 16 IU/L (ref 0–40)
Albumin/Globulin Ratio: 2 (ref 1.2–2.2)
Albumin: 4.6 g/dL (ref 3.8–4.8)
Alkaline Phosphatase: 86 IU/L (ref 44–121)
BUN/Creatinine Ratio: 15 (ref 9–23)
BUN: 11 mg/dL (ref 6–20)
Bilirubin Total: 0.2 mg/dL (ref 0.0–1.2)
CO2: 22 mmol/L (ref 20–29)
Calcium: 9.6 mg/dL (ref 8.7–10.2)
Chloride: 101 mmol/L (ref 96–106)
Creatinine, Ser: 0.72 mg/dL (ref 0.57–1.00)
Globulin, Total: 2.3 g/dL (ref 1.5–4.5)
Glucose: 82 mg/dL (ref 65–99)
Potassium: 4.3 mmol/L (ref 3.5–5.2)
Sodium: 138 mmol/L (ref 134–144)
Total Protein: 6.9 g/dL (ref 6.0–8.5)
eGFR: 110 mL/min/{1.73_m2} (ref >59)

## 2021-01-13 LAB — TSH: TSH: 6.81 u[IU]/mL — ABNORMAL HIGH (ref 0.450–4.500)

## 2021-01-13 LAB — CBC WITH DIFFERENTIAL/PLATELET
Basophils Absolute: 0.1 10*3/uL (ref 0.0–0.2)
Basos: 1 %
EOS (ABSOLUTE): 0.5 10*3/uL — ABNORMAL HIGH (ref 0.0–0.4)
Eos: 4 %
Hematocrit: 47 % — ABNORMAL HIGH (ref 34.0–46.6)
Hemoglobin: 16 g/dL — ABNORMAL HIGH (ref 11.1–15.9)
Immature Grans (Abs): 0 10*3/uL (ref 0.0–0.1)
Immature Granulocytes: 0 %
Lymphocytes Absolute: 3.4 10*3/uL — ABNORMAL HIGH (ref 0.7–3.1)
Lymphs: 29 %
MCH: 33.5 pg — ABNORMAL HIGH (ref 26.6–33.0)
MCHC: 34 g/dL (ref 31.5–35.7)
MCV: 98 fL — ABNORMAL HIGH (ref 79–97)
Monocytes Absolute: 0.9 10*3/uL (ref 0.1–0.9)
Monocytes: 8 %
Neutrophils Absolute: 6.7 10*3/uL (ref 1.4–7.0)
Neutrophils: 58 %
Platelets: 301 10*3/uL (ref 150–450)
RBC: 4.78 x10E6/uL (ref 3.77–5.28)
RDW: 12.1 % (ref 11.7–15.4)
WBC: 11.6 10*3/uL — ABNORMAL HIGH (ref 3.4–10.8)

## 2021-01-13 LAB — LIPID PANEL
Chol/HDL Ratio: 2.9 ratio (ref 0.0–4.4)
Cholesterol, Total: 176 mg/dL (ref 100–199)
HDL: 61 mg/dL (ref >39)
LDL Chol Calc (NIH): 99 mg/dL (ref 0–99)
Triglycerides: 90 mg/dL (ref 0–149)
VLDL Cholesterol Cal: 16 mg/dL (ref 5–40)

## 2021-01-13 LAB — T4, FREE: Free T4: 1.12 ng/dL (ref 0.82–1.77)

## 2021-01-15 ENCOUNTER — Ambulatory Visit: Payer: Medicaid Other | Admitting: Nurse Practitioner

## 2021-01-15 NOTE — Progress Notes (Signed)
[place].  Your lab work shows that your Thyroid is not well controlled, likely due to not taking your levothyroxine. I recommend restarting that medication.  Do you need a refill?  Otherwise, your lab work looks good. Besides your blood counts show your blood is a little thicker than normal which is likely due to your cigarette smoking.  Please let me know if you have any questions.  We will follow up as discussed in our visit.

## 2021-01-24 IMAGING — MG DIGITAL DIAGNOSTIC BILAT W/ TOMO W/ CAD
6 of 10 series · 6 of 30 positions shown · non-contrast
Comparison: None.

CLINICAL DATA: 34-year-old female with a tender left breast lump
for 2 weeks.

EXAM:
DIGITAL DIAGNOSTIC BILATERAL MAMMOGRAM WITH CAD AND TOMOSYNTHESIS
ULTRASOUND BREAST LEFT
TECHNIQUE: Bilateral digital diagnostic mammography and breast tomosynthesis
was performed. Digital images of the breasts were evaluated with
computer-aided detection. Targeted ultrasound examination of the
left breast was performed.

[R CC synth-2D]
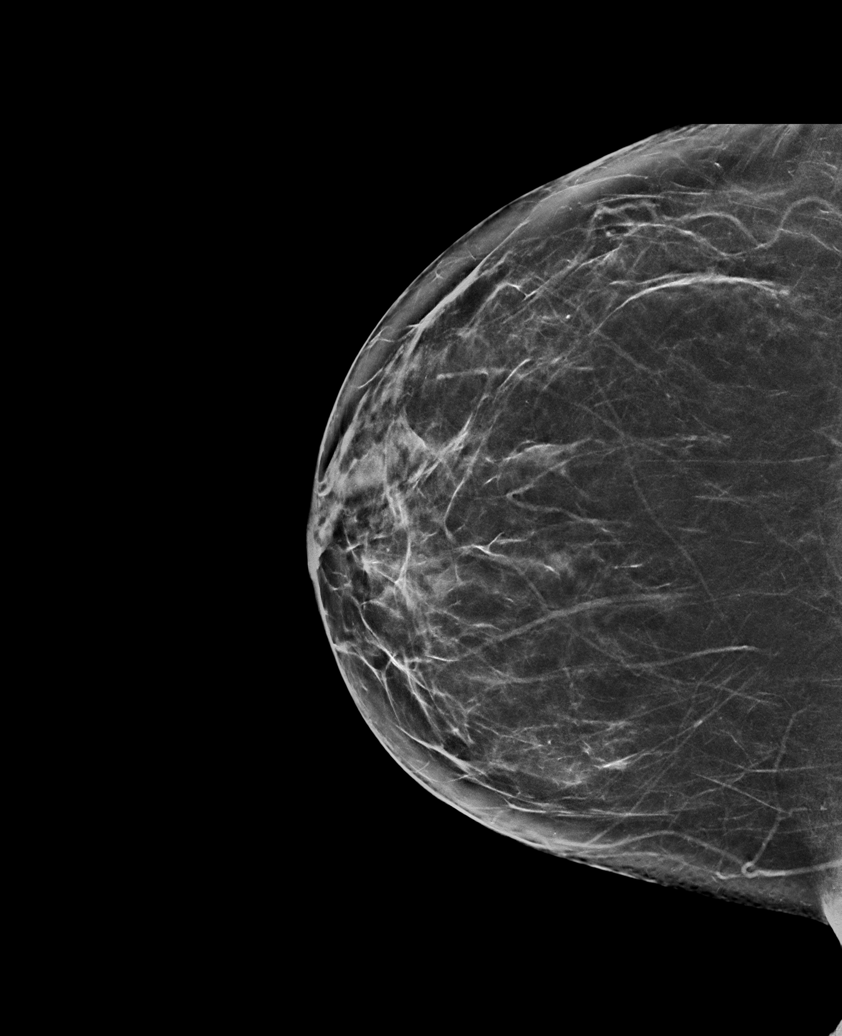

[R MLO synth-2D]
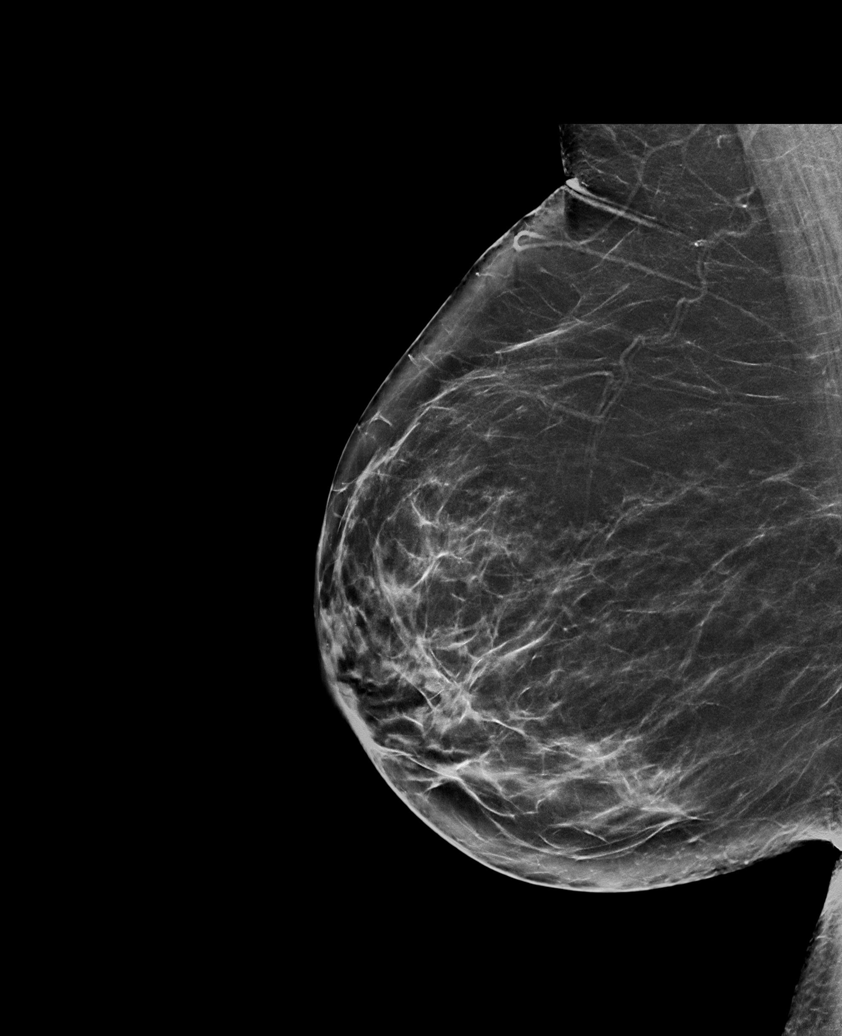

[L CC synth-2D]
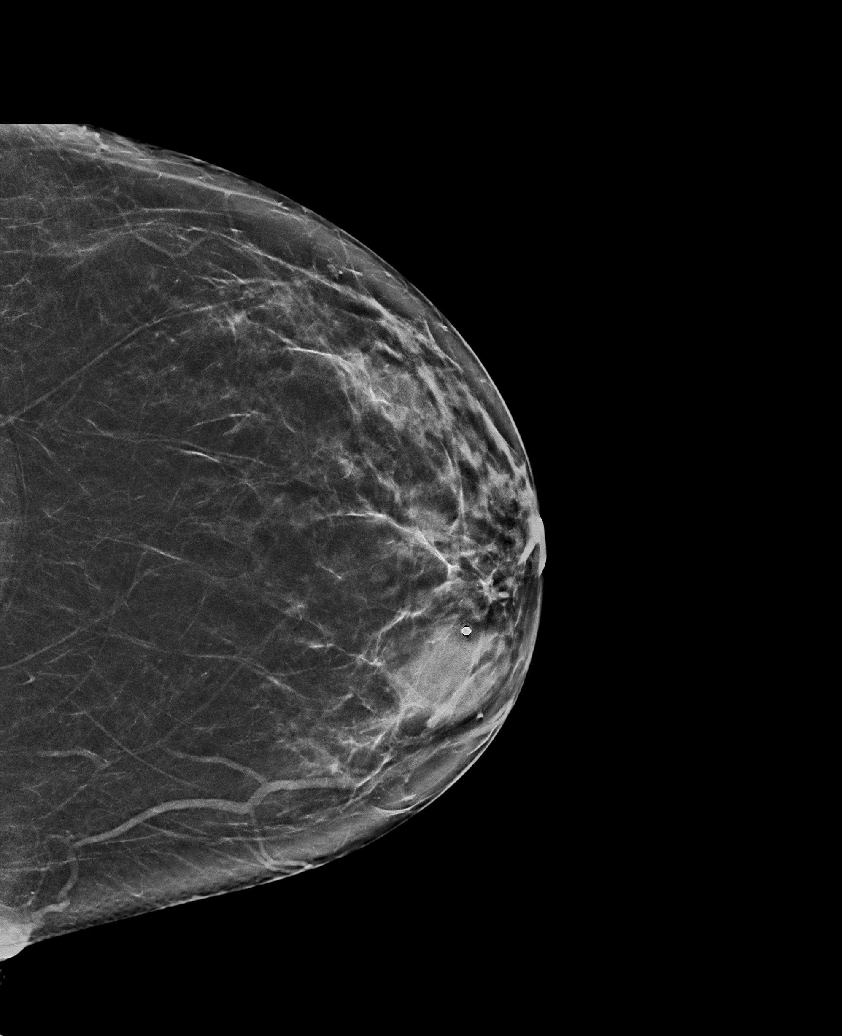

[L TAN synth-2D]
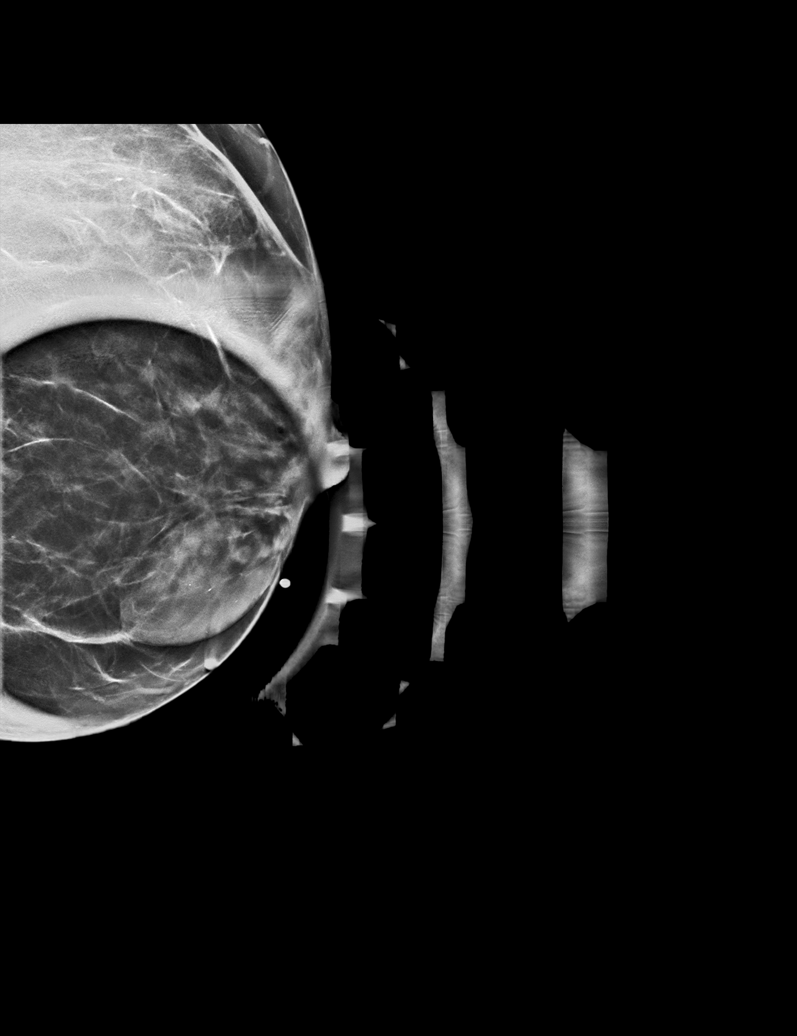

[L MLO synth-2D]
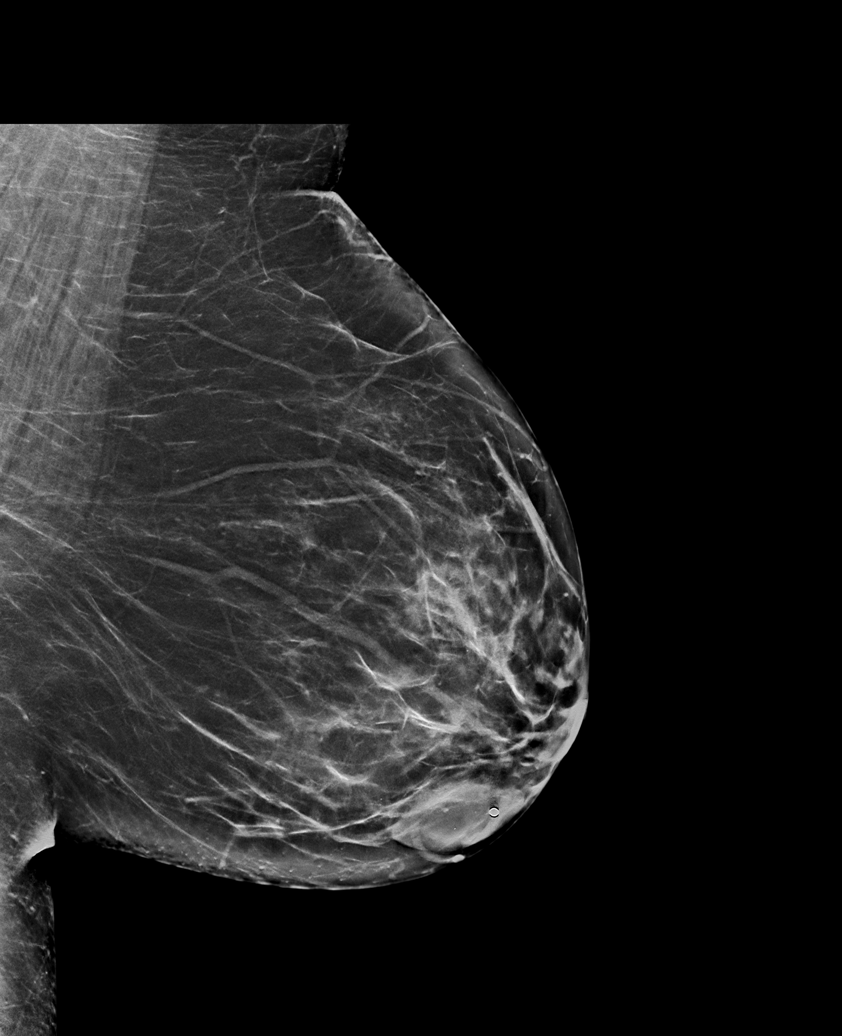

[L MLO tomo · tomo slice 41/81.0]
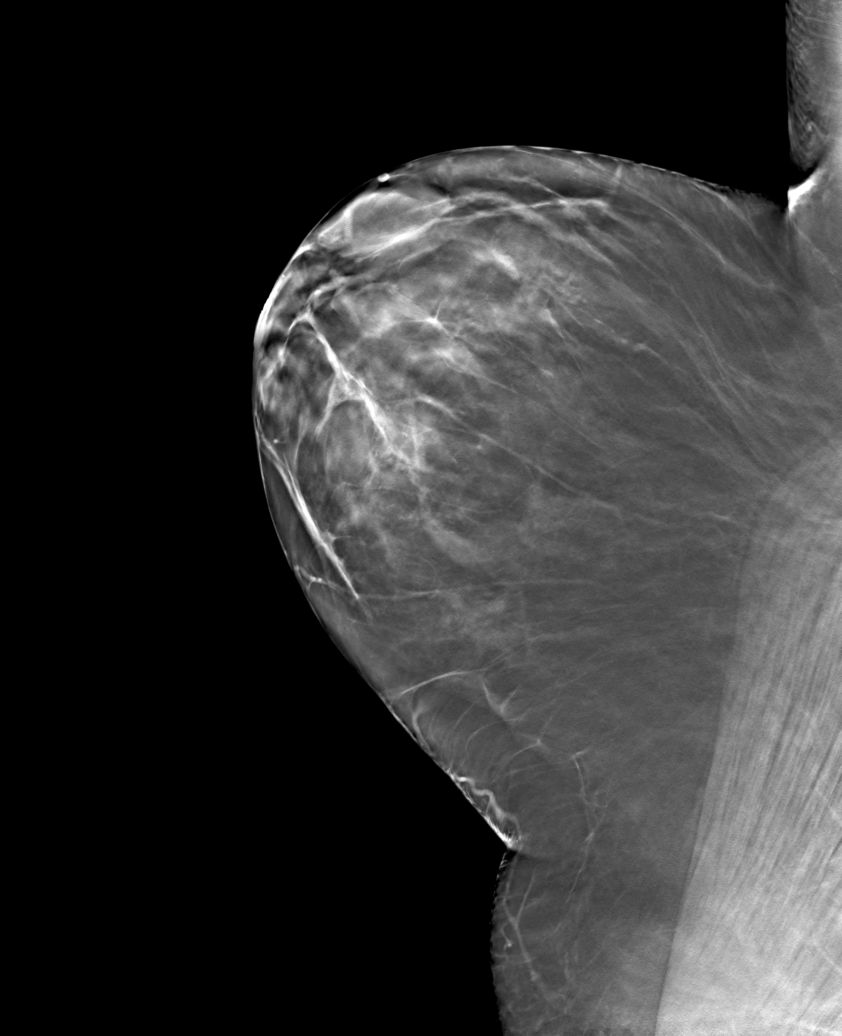

[6 of 30 positions shown; findings below may reference images not displayed]

ACR Breast Density Category b: There are scattered areas of
fibroglandular density.
FINDINGS: A radiopaque BB was placed at the site of the patient's palpable
lump in the lower inner left breast. An oval, circumscribed hyper
dense mass is seen deep to the radiopaque BB. No additional
suspicious findings are identified in the remainder of either
breast.

On physical exam, I palpate a 2-3 cm mobile mass in the lower inner,
periareolar left breast.

Targeted ultrasound is performed, showing an oval, heterogeneous
mass with circumscribed and angular margins at the 6 o'clock
position 1 cm from the nipple. It measures 3.4 x 2.9 x 1.5 cm. There
is associated internal vascularity. This correlates well with the
mammographic finding. Evaluation of the left axilla demonstrates no
suspicious lymphadenopathy.
IMPRESSION: 1. Indeterminate left breast mass corresponding with the patient's
palpable lump. Recommendation is for ultrasound-guided biopsy.
2. No suspicious left axillary lymphadenopathy.
3. No mammographic evidence of malignancy on the right.

RECOMMENDATION:
Ultrasound-guided biopsy of the left breast.

I have discussed the findings and recommendations with the patient.
If applicable, a reminder letter will be sent to the patient
regarding the next appointment.

BI-RADS CATEGORY  4: Suspicious.

## 2021-01-30 IMAGING — MG US BREAST BX W LOC DEV 1ST LESION IMG BX SPEC US GUIDE*L*
1 series · 8 of 8 positions shown · non-contrast
Comparison: Previous exam(s).
COMPARISON: Previous exam(s).

Addendum:
CLINICAL DATA: 34-year-old female with an indeterminate left breast
mass.

EXAM:
ULTRASOUND GUIDED LEFT BREAST CORE NEEDLE BIOPSY

[Series 1: MG view · 0.06mm/px · 8 of 8 slices shown]
[im 1/8]
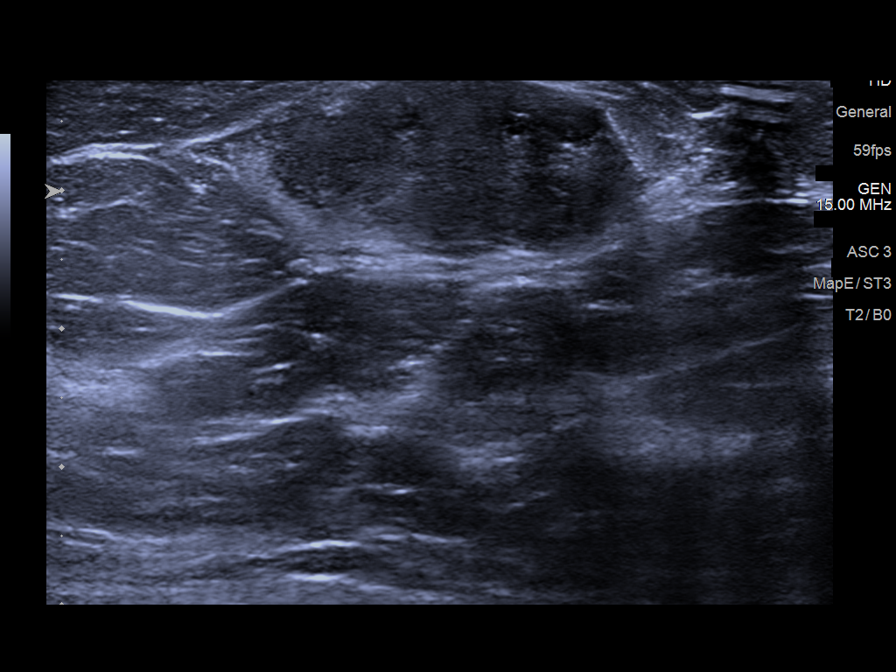
[im 2/8]
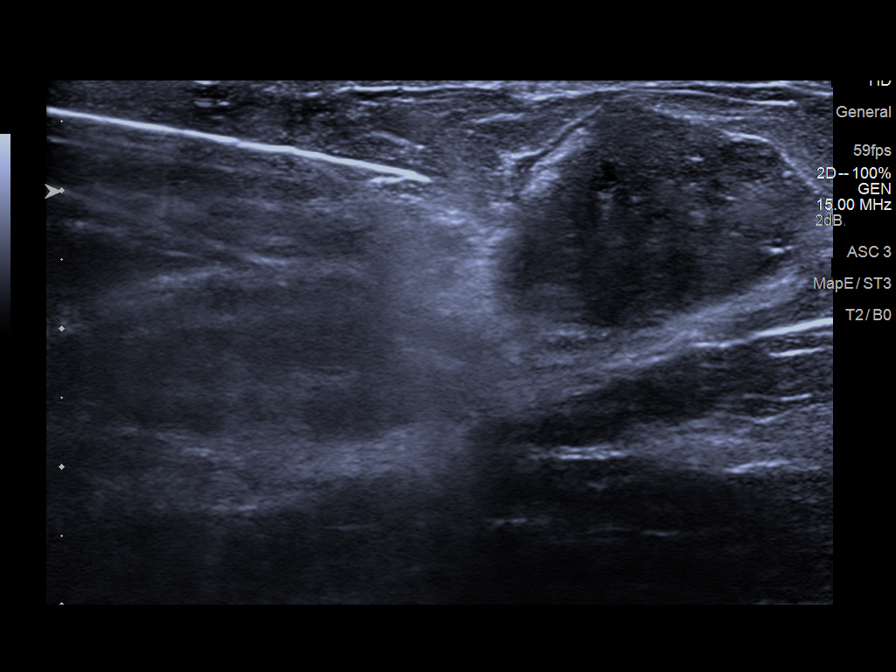
[im 3/8]
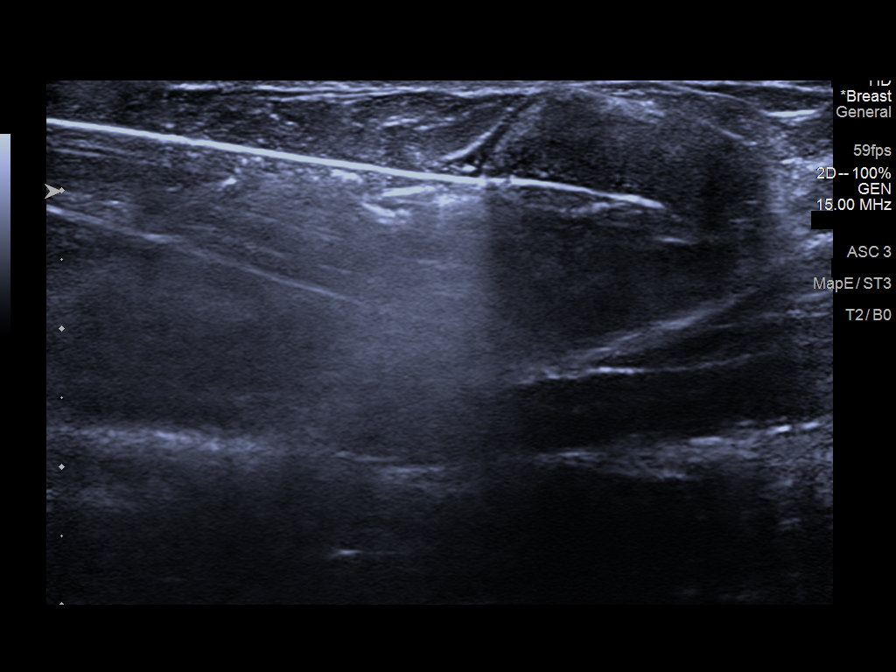
[im 4/8]
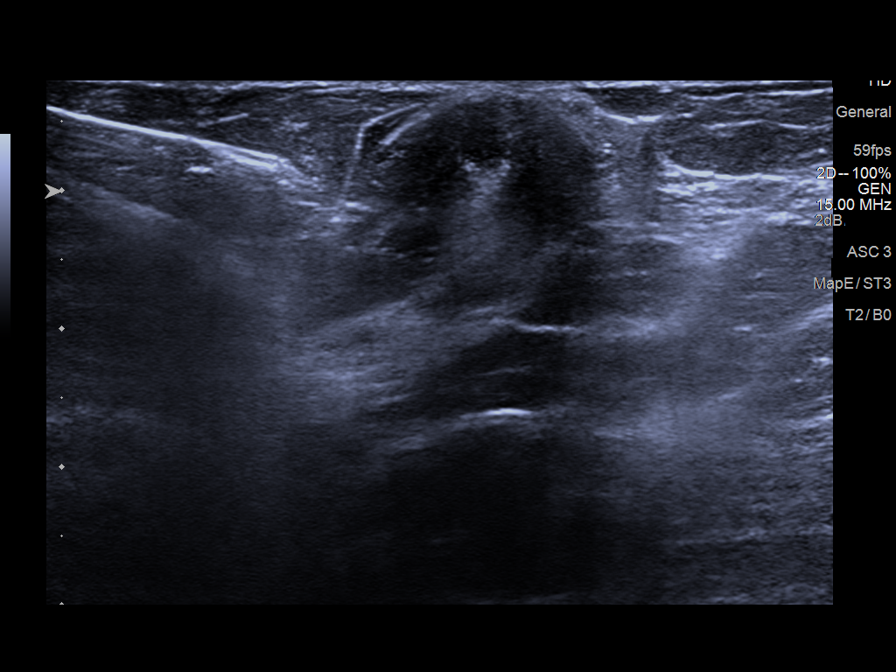
[im 5/8]
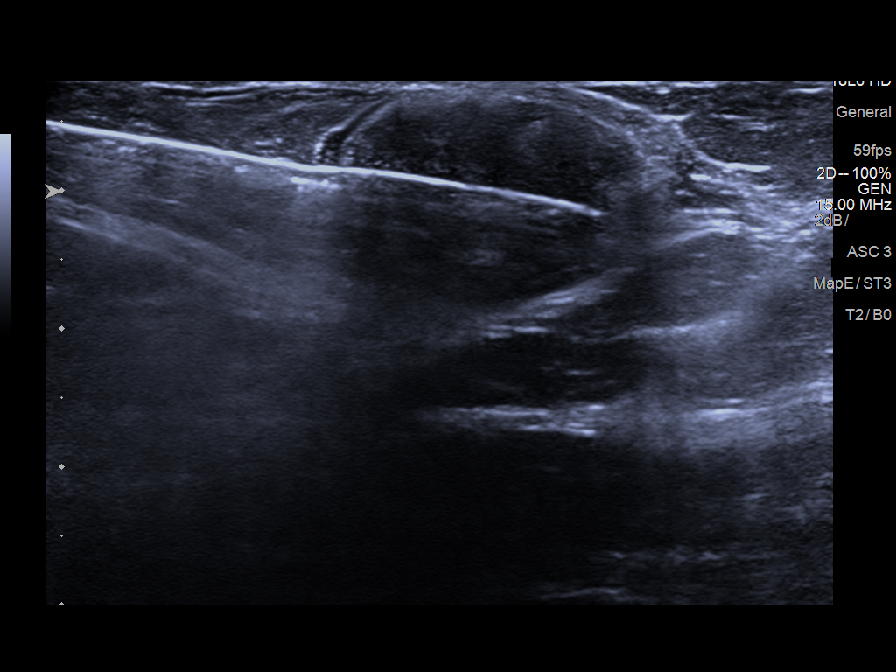
[im 6/8]
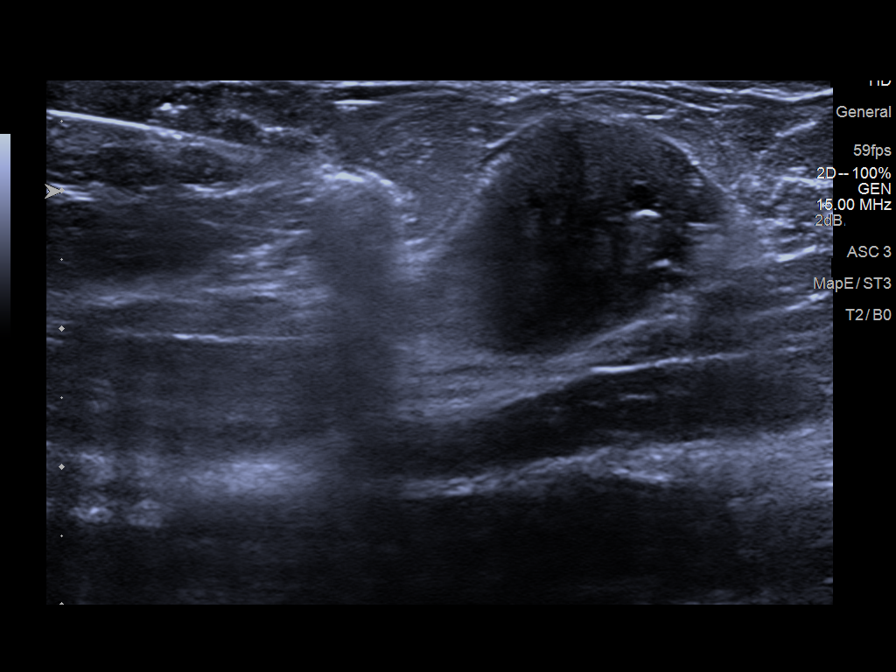
[im 7/8]
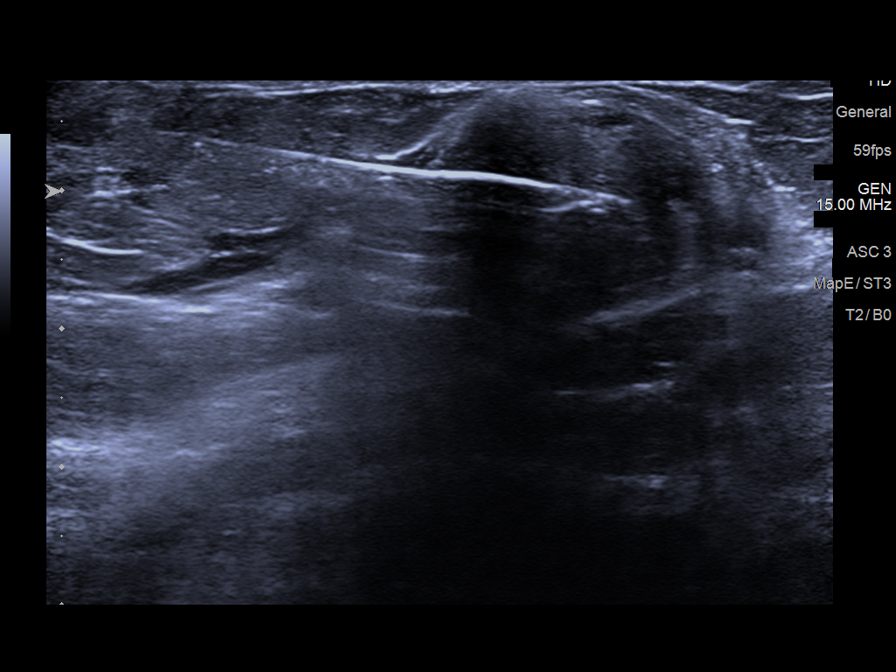
[im 8/8]
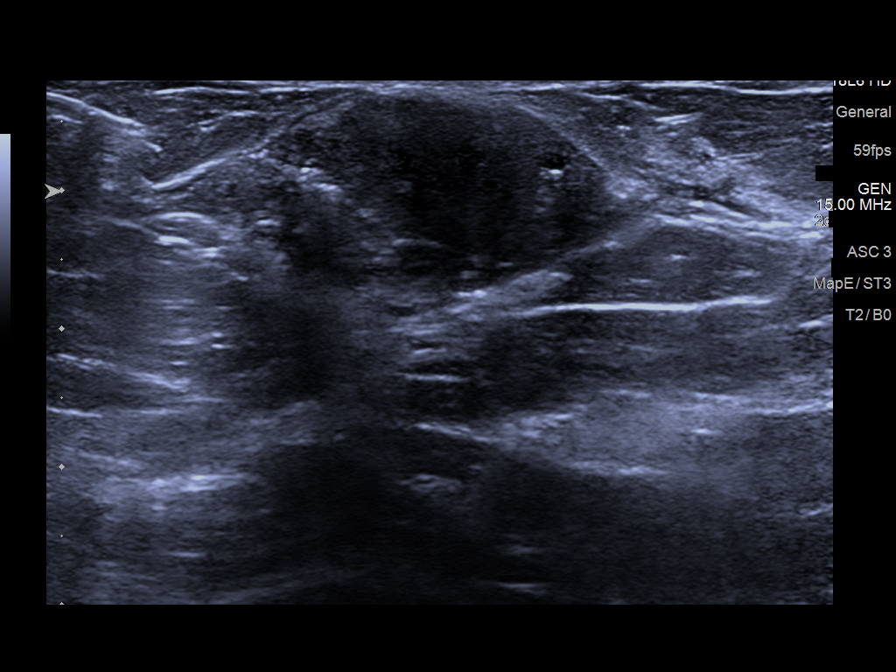

[8 of 8 positions shown; findings below may reference images not displayed]



Lesion quadrant: Lower inner quadrant

Using sterile technique and 1% Lidocaine as local anesthetic, under
direct ultrasound visualization, a 12 gauge Itte device was
used to perform biopsy of a mass at the 6 o'clock position using a
lateral approach. At the conclusion of the procedure a Q shaped
tissue marker clip was deployed into the biopsy cavity. Follow up 2
view mammogram was performed and dictated separately.
IMPRESSION: Ultrasound guided biopsy of the left breast. No apparent
complications.

ADDENDUM:
PATHOLOGY revealed: A. BREAST, LEFT, 6 O'CLOCK 1 CM FROM NIPPLE;
ULTRASOUND-GUIDED CORE BIOPSY: - BENIGN BREAST TISSUE, PREDOMINANTLY
STROMA WITH FEATURES OF PSEUDOANGIOMATOUS STROMAL HYPERPLASIA
([place]). - FOCAL MILD USUAL DUCTAL HYPERPLASIA AND A FEW APOCRINE
MICROCYSTS. - NEGATIVE FOR ATYPIA AND MALIGNANCY. Comment: The cores
contain mostly hypocellular fibrous stroma. Correlation with all
imaging findings is recommended.

Pathology results are DISCORDANT with imaging findings, per Dr.
Cecilka Lune with excision recommended. The patient also would like
excision for symptomatic relief of associated pain.

Pathology results and recommendations below were discussed with
patient by telephone on 07/05/2020. Patient reported biopsy site
within normal limits with tenderness at the site. Post biopsy care
instructions were reviewed, questions were answered and my direct
phone number was provided to patient. Patient was instructed to call
[HOSPITAL] if any concerns or questions arise related to
the biopsy.

Recommend surgical consultation: Request for surgical consultation
relayed to Don Lolito Onacram RN and Lkw Tiger RN at [HOSPITAL]
[HOSPITAL] by Ibelisse Mchale RN on 07/05/2020.

Pathology results reported by Ibelisse Mchale RN on 07/05/2020.



Lesion quadrant: Lower inner quadrant

Using sterile technique and 1% Lidocaine as local anesthetic, under
direct ultrasound visualization, a 12 gauge Itte device was
used to perform biopsy of a mass at the 6 o'clock position using a
lateral approach. At the conclusion of the procedure a Q shaped
tissue marker clip was deployed into the biopsy cavity. Follow up 2
view mammogram was performed and dictated separately.
IMPRESSION: Ultrasound guided biopsy of the left breast. No apparent
complications.

## 2021-02-02 ENCOUNTER — Telehealth: Payer: Self-pay | Admitting: Nurse Practitioner

## 2021-02-02 NOTE — Telephone Encounter (Signed)
Medication Refill - Medication: Levothyroxine (patient says Jon Billings wants her to start this, per most recent appt)  Has the patient contacted their pharmacy? No. (Agent: If no, request that the patient contact the pharmacy for the refill.) (Agent: If yes, when and what did the pharmacy advise?)  Preferred Pharmacy (with phone number or street name):  [facility] [place], [place] - [address]  [place] [address]  Phone: [phone] Fax: [phone]    Agent: Please be advised that RX refills may take up to 3 business days. We ask that you follow-up with your pharmacy.

## 2021-02-05 MED ORDER — LEVOTHYROXINE SODIUM 25 MCG PO TABS: 25.0000 ug | ORAL_TABLET | Freq: Every day | ORAL | 1 refills | Status: DC

## 2021-02-05 NOTE — Telephone Encounter (Signed)
Routing to provider for medication. No current RX on list, looks like the medication was supposed to be restarted based on last lab results.

## 2021-02-05 NOTE — Telephone Encounter (Signed)
Medication sent to the pharmacy for the patient. It should be taken first thing in the morning on an empty stomach with only water.  She should wait at least 30 minutes before eating or drinking anything else.

## 2021-02-05 NOTE — Telephone Encounter (Signed)
Left detailed message for patient regarding prescription. Advised patient to give our office a call back if she has any questions or concerns regarding medication.

## 2021-02-08 DIAGNOSIS — Z419 Encounter for procedure for purposes other than remedying health state, unspecified: Secondary | ICD-10-CM | POA: Diagnosis not present

## 2021-03-05 ENCOUNTER — Ambulatory Visit: Payer: Medicaid Other | Admitting: Nurse Practitioner

## 2021-03-10 DIAGNOSIS — Z419 Encounter for procedure for purposes other than remedying health state, unspecified: Secondary | ICD-10-CM | POA: Diagnosis not present

## 2021-03-12 ENCOUNTER — Ambulatory Visit: Payer: Medicaid Other | Admitting: Nurse Practitioner

## 2021-03-16 NOTE — Progress Notes (Signed)
BP 121/77   Pulse 86   Ht '5\' 7"'  (1.702 m)   Wt 284 lb (128.8 kg)   BMI 44.48 kg/m    Subjective:    Patient ID: Megan Bennett, female    DOB: 1986/03/31, 35 y.o.   MRN: [id number]  HPI: Megan Bennett is a 35 y.o. female  Chief Complaint  Patient presents with   Thyroid Problem   Patient states she did restart the Levothyroxine but she hasn't seen any difference.  She is taking it with other medications currently.    PAIN Patient states she is taking Ibuprofen 867m 1-2 times per day to help with Fibromyalgia and other pain.    Relevant past medical, surgical, family and social history reviewed and updated as indicated. Interim medical history since our last visit reviewed. Allergies and medications reviewed and updated.  Review of Systems  Musculoskeletal:  Positive for myalgias.   Per HPI unless specifically indicated above     Objective:    BP 121/77   Pulse 86   Ht '5\' 7"'  (1.702 m)   Wt 284 lb (128.8 kg)   BMI 44.48 kg/m   Wt Readings from Last 3 Encounters:  03/19/21 284 lb (128.8 kg)  01/12/21 284 lb 3.2 oz (128.9 kg)  10/02/20 270 lb (122.5 kg)    Physical Exam Vitals and nursing note reviewed.  Constitutional:      General: She is not in acute distress.    Appearance: Normal appearance. She is obese. She is not ill-appearing, toxic-appearing or diaphoretic.  HENT:     Head: Normocephalic.     Right Ear: External ear normal.     Left Ear: External ear normal.     Nose: Nose normal.     Mouth/Throat:     Mouth: Mucous membranes are moist.     Pharynx: Oropharynx is clear.  Eyes:     General:        Right eye: No discharge.        Left eye: No discharge.     Extraocular Movements: Extraocular movements intact.     Conjunctiva/sclera: Conjunctivae normal.     Pupils: Pupils are equal, round, and reactive to light.  Cardiovascular:     Rate and Rhythm: Normal rate and regular rhythm.     Heart sounds: No murmur heard. Pulmonary:      Effort: Pulmonary effort is normal. No respiratory distress.     Breath sounds: Normal breath sounds. No wheezing or rales.  Musculoskeletal:     Cervical back: Normal range of motion and neck supple.  Skin:    General: Skin is warm and dry.     Capillary Refill: Capillary refill takes less than 2 seconds.  Neurological:     General: No focal deficit present.     Mental Status: She is alert and oriented to person, place, and time. Mental status is at baseline.  Psychiatric:        Mood and Affect: Mood normal.        Behavior: Behavior normal.        Thought Content: Thought content normal.        Judgment: Judgment normal.    Results for orders placed or performed in visit on 01/12/21  TSH  Result Value Ref Range   TSH 6.810 (H) 0.450 - 4.500 uIU/mL  T4, free  Result Value Ref Range   Free T4 1.12 0.82 - 1.77 ng/dL  Comp Met (CMET)  Result Value Ref  Range   Glucose 82 65 - 99 mg/dL   BUN 11 6 - 20 mg/dL   Creatinine, Ser 0.72 0.57 - 1.00 mg/dL   eGFR 110 >59 mL/min/1.73   BUN/Creatinine Ratio 15 9 - 23   Sodium 138 134 - 144 mmol/L   Potassium 4.3 3.5 - 5.2 mmol/L   Chloride 101 96 - 106 mmol/L   CO2 22 20 - 29 mmol/L   Calcium 9.6 8.7 - 10.2 mg/dL   Total Protein 6.9 6.0 - 8.5 g/dL   Albumin 4.6 3.8 - 4.8 g/dL   Globulin, Total 2.3 1.5 - 4.5 g/dL   Albumin/Globulin Ratio 2.0 1.2 - 2.2   Bilirubin Total 0.2 0.0 - 1.2 mg/dL   Alkaline Phosphatase 86 44 - 121 IU/L   AST 16 0 - 40 IU/L   ALT 15 0 - 32 IU/L  CBC w/Diff  Result Value Ref Range   WBC 11.6 (H) 3.4 - 10.8 x10E3/uL   RBC 4.78 3.77 - 5.28 x10E6/uL   Hemoglobin 16.0 (H) 11.1 - 15.9 g/dL   Hematocrit 47.0 (H) 34.0 - 46.6 %   MCV 98 (H) 79 - 97 fL   MCH 33.5 (H) 26.6 - 33.0 pg   MCHC 34.0 31.5 - 35.7 g/dL   RDW 12.1 11.7 - 15.4 %   Platelets 301 150 - 450 x10E3/uL   Neutrophils 58 Not Estab. %   Lymphs 29 Not Estab. %   Monocytes 8 Not Estab. %   Eos 4 Not Estab. %   Basos 1 Not Estab. %    Neutrophils Absolute 6.7 1.4 - 7.0 x10E3/uL   Lymphocytes Absolute 3.4 (H) 0.7 - 3.1 x10E3/uL   Monocytes Absolute 0.9 0.1 - 0.9 x10E3/uL   EOS (ABSOLUTE) 0.5 (H) 0.0 - 0.4 x10E3/uL   Basophils Absolute 0.1 0.0 - 0.2 x10E3/uL   Immature Granulocytes 0 Not Estab. %   Immature Grans (Abs) 0.0 0.0 - 0.1 x10E3/uL  Lipid Profile  Result Value Ref Range   Cholesterol, Total 176 100 - 199 mg/dL   Triglycerides 90 0 - 149 mg/dL   HDL 61 >39 mg/dL   VLDL Cholesterol Cal 16 5 - 40 mg/dL   LDL Chol Calc (NIH) 99 0 - 99 mg/dL   Chol/HDL Ratio 2.9 0.0 - 4.4 ratio      Assessment & Plan:   Problem List Items Addressed This Visit       Other   Fibromyalgia - Primary    Ongoing.  Has been controlled with Ibuprofen.  However, discussed the risks of long term NSAIDS.  Will start Gabapentin 162m daily then increase to BID if able to tolerate the medication.  Side effects and benefits of medication discussed with patient during visit. Will follow up in 2 months.       Relevant Medications   ibuprofen (ADVIL) 800 MG tablet   gabapentin (NEURONTIN) 100 MG capsule   Other Relevant Orders   Comp Met (CMET)   Other Visit Diagnoses     Acquired hypothyroidism       Labs ordered today. Will make further recommendations based on lab results. Educated on proper way to take Levothyroxine.   Relevant Orders   TSH   T4, free        Follow up plan: Return in about 2 months (around 05/19/2021) for Fibromyalgia.

## 2021-03-19 ENCOUNTER — Encounter: Payer: Self-pay | Admitting: Nurse Practitioner

## 2021-03-19 ENCOUNTER — Ambulatory Visit (INDEPENDENT_AMBULATORY_CARE_PROVIDER_SITE_OTHER): Payer: Medicaid Other | Admitting: Nurse Practitioner

## 2021-03-19 ENCOUNTER — Other Ambulatory Visit: Payer: Self-pay

## 2021-03-19 VITALS — BP 121/77 | HR 86 | Ht 67.0 in | Wt 284.0 lb

## 2021-03-19 DIAGNOSIS — E039 Hypothyroidism, unspecified: Secondary | ICD-10-CM | POA: Diagnosis not present

## 2021-03-19 DIAGNOSIS — M797 Fibromyalgia: Secondary | ICD-10-CM

## 2021-03-19 MED ORDER — PANTOPRAZOLE SODIUM 40 MG PO TBEC: 40.0000 mg | DELAYED_RELEASE_TABLET | Freq: Every day | ORAL | 2 refills | Status: DC

## 2021-03-19 MED ORDER — GABAPENTIN 100 MG PO CAPS: 100.0000 mg | ORAL_CAPSULE | Freq: Two times a day (BID) | ORAL | 1 refills | Status: DC

## 2021-03-19 MED ORDER — IBUPROFEN 800 MG PO TABS: 800.0000 mg | ORAL_TABLET | Freq: Three times a day (TID) | ORAL | 1 refills | Status: DC | PRN

## 2021-03-19 MED ORDER — STIOLTO RESPIMAT 2.5-2.5 MCG/ACT IN AERS: INHALATION_SPRAY | RESPIRATORY_TRACT | 1 refills | Status: DC

## 2021-03-19 NOTE — Assessment & Plan Note (Signed)
Ongoing.  Has been controlled with Ibuprofen.  However, discussed the risks of long term NSAIDS.  Will start Gabapentin 100mg  daily then increase to BID if able to tolerate the medication.  Side effects and benefits of medication discussed with patient during visit. Will follow up in 2 months.

## 2021-03-20 LAB — COMPREHENSIVE METABOLIC PANEL
ALT: 14 IU/L (ref 0–32)
AST: 13 IU/L (ref 0–40)
Albumin/Globulin Ratio: 1.5 (ref 1.2–2.2)
Albumin: 4.3 g/dL (ref 3.8–4.8)
Alkaline Phosphatase: 91 IU/L (ref 44–121)
BUN/Creatinine Ratio: 19 (ref 9–23)
BUN: 13 mg/dL (ref 6–20)
Bilirubin Total: 0.5 mg/dL (ref 0.0–1.2)
CO2: 19 mmol/L — ABNORMAL LOW (ref 20–29)
Calcium: 9.6 mg/dL (ref 8.7–10.2)
Chloride: 102 mmol/L (ref 96–106)
Creatinine, Ser: 0.67 mg/dL (ref 0.57–1.00)
Globulin, Total: 2.8 g/dL (ref 1.5–4.5)
Glucose: 101 mg/dL — ABNORMAL HIGH (ref 70–99)
Potassium: 4.2 mmol/L (ref 3.5–5.2)
Sodium: 137 mmol/L (ref 134–144)
Total Protein: 7.1 g/dL (ref 6.0–8.5)
eGFR: 115 mL/min/{1.73_m2} (ref >59)

## 2021-03-20 LAB — T4, FREE: Free T4: 1.29 ng/dL (ref 0.82–1.77)

## 2021-03-20 LAB — TSH: TSH: 4.91 u[IU]/mL — ABNORMAL HIGH (ref 0.450–4.500)

## 2021-03-20 NOTE — Progress Notes (Signed)
Please let patient know that her thyroid labs have improved.  Since she wasn't taking the medication on an empty stomach we will have her make that adjustment and recheck her blood work at next visit. Please let me know if she has any questions.

## 2021-03-26 ENCOUNTER — Ambulatory Visit (INDEPENDENT_AMBULATORY_CARE_PROVIDER_SITE_OTHER): Payer: Medicaid Other | Admitting: Nurse Practitioner

## 2021-03-26 ENCOUNTER — Other Ambulatory Visit: Payer: Self-pay

## 2021-03-26 ENCOUNTER — Encounter: Payer: Self-pay | Admitting: Nurse Practitioner

## 2021-03-26 VITALS — BP 126/94 | HR 103 | Wt 280.0 lb

## 2021-03-26 DIAGNOSIS — N946 Dysmenorrhea, unspecified: Secondary | ICD-10-CM

## 2021-03-26 NOTE — Progress Notes (Signed)
BP (!) 126/94   Pulse (!) 103   Wt 280 lb (127 kg)   SpO2 98%   BMI 43.85 kg/m    Subjective:    Patient ID: Megan Bennett, female    DOB: 21-Oct-1985, 35 y.o.   MRN: [id number]  HPI: Megan Bennett is a 35 y.o. female  Chief Complaint  Patient presents with   Cramp Concerns    Patient states she had Ablation in September of 2021. Patient states she wiped one time and had a little bit of spotting. Patient states when she called to make the appointment she was having severe cramping. Patient denies having any bleeding during that time. Patient states she reached out to the office that did her Ablation and the doctor she had the previous Ablation with has left the office and was told she could get in but it wouldn't be until December. Patient states she was told to come to PCP f    Patient states she is having severe abdominal cramping.  She had an ablation with GYN in September of 21.  She called to follow up with them due to the intensity of her cramping and they told her her provider left and she would have to wait until December for an appt and to follow up with her PCP.  Patient is here for referral to new GYN.    Relevant past medical, surgical, family and social history reviewed and updated as indicated. Interim medical history since our last visit reviewed. Allergies and medications reviewed and updated.  Review of Systems  Genitourinary:  Positive for menstrual problem.   Per HPI unless specifically indicated above     Objective:    BP (!) 126/94   Pulse (!) 103   Wt 280 lb (127 kg)   SpO2 98%   BMI 43.85 kg/m   Wt Readings from Last 3 Encounters:  03/26/21 280 lb (127 kg)  03/19/21 284 lb (128.8 kg)  01/12/21 284 lb 3.2 oz (128.9 kg)    Physical Exam Vitals and nursing note reviewed.  Constitutional:      General: She is not in acute distress.    Appearance: Normal appearance. She is normal weight. She is not ill-appearing, toxic-appearing or  diaphoretic.  HENT:     Head: Normocephalic.     Right Ear: External ear normal.     Left Ear: External ear normal.     Nose: Nose normal.     Mouth/Throat:     Mouth: Mucous membranes are moist.     Pharynx: Oropharynx is clear.  Eyes:     General:        Right eye: No discharge.        Left eye: No discharge.     Extraocular Movements: Extraocular movements intact.     Conjunctiva/sclera: Conjunctivae normal.     Pupils: Pupils are equal, round, and reactive to light.  Cardiovascular:     Rate and Rhythm: Normal rate and regular rhythm.     Heart sounds: No murmur heard. Pulmonary:     Effort: Pulmonary effort is normal. No respiratory distress.     Breath sounds: Normal breath sounds. No wheezing or rales.  Musculoskeletal:     Cervical back: Normal range of motion and neck supple.  Skin:    General: Skin is warm and dry.     Capillary Refill: Capillary refill takes less than 2 seconds.  Neurological:     General: No focal deficit present.  Mental Status: She is alert and oriented to person, place, and time. Mental status is at baseline.  Psychiatric:        Mood and Affect: Mood normal.        Behavior: Behavior normal.        Thought Content: Thought content normal.        Judgment: Judgment normal.    Results for orders placed or performed in visit on 03/19/21  TSH  Result Value Ref Range   TSH 4.910 (H) 0.450 - 4.500 uIU/mL  T4, free  Result Value Ref Range   Free T4 1.29 0.82 - 1.77 ng/dL  Comp Met (CMET)  Result Value Ref Range   Glucose 101 (H) 70 - 99 mg/dL   BUN 13 6 - 20 mg/dL   Creatinine, Ser 0.67 0.57 - 1.00 mg/dL   eGFR 115 >59 mL/min/1.73   BUN/Creatinine Ratio 19 9 - 23   Sodium 137 134 - 144 mmol/L   Potassium 4.2 3.5 - 5.2 mmol/L   Chloride 102 96 - 106 mmol/L   CO2 19 (L) 20 - 29 mmol/L   Calcium 9.6 8.7 - 10.2 mg/dL   Total Protein 7.1 6.0 - 8.5 g/dL   Albumin 4.3 3.8 - 4.8 g/dL   Globulin, Total 2.8 1.5 - 4.5 g/dL    Albumin/Globulin Ratio 1.5 1.2 - 2.2   Bilirubin Total 0.5 0.0 - 1.2 mg/dL   Alkaline Phosphatase 91 44 - 121 IU/L   AST 13 0 - 40 IU/L   ALT 14 0 - 32 IU/L      Assessment & Plan:   Problem List Items Addressed This Visit   None Visit Diagnoses     Menstrual cramp    -  Primary   Referral placed for patient to see GYn to due history of ablation and having bleeding and cramping now. Information given for two GYN options.    Relevant Orders   Ambulatory referral to Gynecology        Follow up plan: Return if symptoms worsen or fail to improve.

## 2021-03-27 ENCOUNTER — Telehealth: Payer: Self-pay

## 2021-03-27 NOTE — Telephone Encounter (Signed)
Pt is scheduled on 11/7 with CRS at 2:30.

## 2021-03-27 NOTE — Telephone Encounter (Signed)
[facility] referring for Menstrual cramp. Contacted patient to schedule. Patient requested to call back to be scheduled. Any OB provider.

## 2021-04-03 DIAGNOSIS — B070 Plantar wart: Secondary | ICD-10-CM | POA: Diagnosis not present

## 2021-04-03 DIAGNOSIS — M2141 Flat foot [pes planus] (acquired), right foot: Secondary | ICD-10-CM | POA: Diagnosis not present

## 2021-04-10 DIAGNOSIS — Z419 Encounter for procedure for purposes other than remedying health state, unspecified: Secondary | ICD-10-CM | POA: Diagnosis not present

## 2021-04-15 ENCOUNTER — Other Ambulatory Visit: Payer: Self-pay | Admitting: Nurse Practitioner

## 2021-04-15 NOTE — Telephone Encounter (Signed)
last RF 03/19/21 #90 1 RF

## 2021-04-16 ENCOUNTER — Other Ambulatory Visit: Payer: Self-pay

## 2021-04-16 ENCOUNTER — Encounter: Payer: Self-pay | Admitting: Obstetrics and Gynecology

## 2021-04-16 ENCOUNTER — Ambulatory Visit (INDEPENDENT_AMBULATORY_CARE_PROVIDER_SITE_OTHER): Payer: Medicaid Other | Admitting: Obstetrics and Gynecology

## 2021-04-16 VITALS — BP 134/70 | Ht 68.0 in | Wt 278.4 lb

## 2021-04-16 DIAGNOSIS — N926 Irregular menstruation, unspecified: Secondary | ICD-10-CM

## 2021-04-16 DIAGNOSIS — R102 Pelvic and perineal pain: Secondary | ICD-10-CM

## 2021-04-16 NOTE — Progress Notes (Signed)
Patient ID: Megan Bennett, female   DOB: [birth year], 35 y.o.   MRN: [id number]  Reason for Consult: Gynecologic Exam   Referred by Jon Billings, NP  Subjective:     HPI:  Megan Bennett is a 35 y.o. female. She reports that she has had an endometrial ablation in the past, 2021. She was happy with the reduction in menstrual bleeding but recently has started having severe pain and bleeding again. The pain is so severe she can not move from the couch. She has had complications of a bowel perforation in 2020. She has family stressors.   Gynecological History  No LMP recorded. Patient has had an ablation. Menarche: 10 or 11  History of fibroids, polyps, or ovarian cysts? : ovarian cysts  History of PCOS? yes Hstory of Endometriosis? no History of abnormal pap smears? no Have you had any sexually transmitted infections in the past? no  She reports HPV vaccination in the past.   She identifies as a female. She is sexually active with men.   She denies dyspareunia. She sometimes has postcoital bleeding.  She currently uses tubal ligation for contraception.   Past Medical History:  Diagnosis Date   ADD (attention deficit disorder)    Arthritis    Fibromyalgia    GERD (gastroesophageal reflux disease)    Headache    MIGRAINES   Family History  Problem Relation Age of Onset   ADD / ADHD Mother    Arthritis Mother    Arthritis Father    ADD / ADHD Sister    Allergies Daughter    Breast cancer Maternal Aunt    Breast cancer Paternal Aunt    Arthritis Maternal Grandmother    Arthritis Maternal Grandfather    Cancer Maternal Grandfather    Past Surgical History:  Procedure Laterality Date   BREAST BIOPSY Left 07/03/2020   [place] bx, q marker, path pending   ESOPHAGOGASTRODUODENOSCOPY     LAPAROSCOPIC TUBAL LIGATION Bilateral 08/03/2018   Procedure: LAPAROSCOPIC TUBAL LIGATION;  Surgeon: Ward, Honor Loh, MD;  Location: [facility];  Service: Gynecology;   Laterality: Bilateral;   LAPAROSCOPY N/A 08/04/2018   Procedure: LAPAROSCOPY DIAGNOSTIC;  Surgeon: Ward, Honor Loh, MD;  Location: [facility];  Service: Gynecology;  Laterality: N/A;   SMALL BOWEL REPAIR N/A 08/04/2018   Procedure: SMALL BOWEL REPAIR;  Surgeon: Ward, Honor Loh, MD;  Location: [facility];  Service: Gynecology;  Laterality: N/A;   WISDOM TOOTH EXTRACTION Bilateral     Short Social History:  Social History   Tobacco Use   Smoking status: Every Day    Packs/day: 0.25    Years: 20.00    Pack years: 5.00    Types: Cigarettes   Smokeless tobacco: Never  Substance Use Topics   Alcohol use: Yes    Alcohol/week: 1.0 standard drink    Types: 1 Glasses of wine per week    Comment: OCC    Allergies  Allergen Reactions   Amoxicillin Rash    Did it involve swelling of the face/tongue/throat, SOB, or low BP? No Did it involve sudden or severe rash/hives, skin peeling, or any reaction on the inside of your mouth or nose? No Did you need to seek medical attention at a hospital or doctor's office? No When did it last happen?      14 + years If all above answers are "NO", may proceed with cephalosporin use.,    Hydrocodone Nausea And Vomiting   Wellbutrin [Bupropion] Other (See Comments)  shakey    Current Outpatient Medications  Medication Sig Dispense Refill   diphenhydrAMINE (BENADRYL) 25 mg capsule Take by mouth as needed.     ibuprofen (ADVIL) 800 MG tablet Take 1 tablet (800 mg total) by mouth every 8 (eight) hours as needed. 90 tablet 1   levothyroxine (SYNTHROID) 25 MCG tablet Take 1 tablet (25 mcg total) by mouth daily. 90 tablet 1   pantoprazole (PROTONIX) 40 MG tablet Take 1 tablet (40 mg total) by mouth daily. 30 tablet 2   PROAIR HFA 108 (90 Base) MCG/ACT inhaler Inhale 2 puffs into the lungs every 6 (six) hours as needed for wheezing or shortness of breath. 18 g 1   STIOLTO RESPIMAT 2.5-2.5 MCG/ACT AERS SMARTSIG:2 Puff(s) Via Inhaler Daily 4 g 1   SUMAtriptan  (IMITREX) 100 MG tablet Take 100 mg by mouth daily as needed.     gabapentin (NEURONTIN) 100 MG capsule Take 1 capsule (100 mg total) by mouth 2 (two) times daily. Take 1 tab at bedtime for 1 week then increase to twice daily. 180 capsule 1   No current facility-administered medications for this visit.    Review of Systems  Constitutional: Negative for chills, fatigue, fever and unexpected weight change.  HENT: Negative for trouble swallowing.  Eyes: Negative for loss of vision.  Respiratory: Negative for cough, shortness of breath and wheezing.  Cardiovascular: Negative for chest pain, leg swelling, palpitations and syncope.  GI: Negative for abdominal pain, blood in stool, diarrhea, nausea and vomiting.  GU: Negative for difficulty urinating, dysuria, frequency and hematuria.  Musculoskeletal: Negative for back pain, leg pain and joint pain.  Skin: Negative for rash.  Neurological: Negative for dizziness, headaches, light-headedness, numbness and seizures.  Psychiatric: Negative for behavioral problem, confusion, depressed mood and sleep disturbance.       Objective:  Objective   Vitals:   04/16/21 1435  BP: 134/70  Weight: 278 lb 6.4 oz (126.3 kg)  Height: 5\' 8"  (1.727 m)   Body mass index is 42.33 kg/m.  Physical Exam Vitals and nursing note reviewed. Exam conducted with a chaperone present.  Constitutional:      Appearance: Normal appearance. She is well-developed.  HENT:     Head: Normocephalic and atraumatic.  Eyes:     Extraocular Movements: Extraocular movements intact.     Pupils: Pupils are equal, round, and reactive to light.  Cardiovascular:     Rate and Rhythm: Normal rate and regular rhythm.  Pulmonary:     Effort: Pulmonary effort is normal. No respiratory distress.     Breath sounds: Normal breath sounds.  Abdominal:     General: Abdomen is flat.     Palpations: Abdomen is soft.  Musculoskeletal:        General: No signs of injury.  Skin:     General: Skin is warm and dry.  Neurological:     Mental Status: She is alert and oriented to person, place, and time.  Psychiatric:        Behavior: Behavior normal.        Thought Content: Thought content normal.        Judgment: Judgment normal.    Assessment/Plan:     35 yo with menorrhagia following endometrial ablation Will check pelvic US and follow up to review options.  Discussed that a second endometrial ablation is not recommended. Discussed that she could consider a hysterectomy.  Nuswab today to check for infection.   More than 30 minutes were spent face  to face with the patient in the room, reviewing the medical record, labs and images, and coordinating care for the patient. The plan of management was discussed in detail and counseling was provided.      Adrian Prows MD [facility], [facility] 04/16/2021 3:44 PM

## 2021-04-18 LAB — NUSWAB VAGINITIS PLUS (VG+)
Candida albicans, NAA: NEGATIVE
Candida glabrata, NAA: NEGATIVE
Chlamydia trachomatis, NAA: NEGATIVE
Neisseria gonorrhoeae, NAA: NEGATIVE
Trich vag by NAA: NEGATIVE

## 2021-04-24 ENCOUNTER — Ambulatory Visit: Admission: RE | Admit: 2021-04-24 | Payer: Medicaid Other | Source: Ambulatory Visit

## 2021-05-07 ENCOUNTER — Ambulatory Visit
Admission: RE | Admit: 2021-05-07 | Discharge: 2021-05-07 | Disposition: A | Discharge status: Home / Self Care | Payer: Medicaid Other | Source: Ambulatory Visit | Attending: Obstetrics and Gynecology | Admitting: Obstetrics and Gynecology

## 2021-05-07 ENCOUNTER — Other Ambulatory Visit: Payer: Self-pay

## 2021-05-07 DIAGNOSIS — Z9851 Tubal ligation status: Secondary | ICD-10-CM | POA: Diagnosis not present

## 2021-05-07 DIAGNOSIS — N926 Irregular menstruation, unspecified: Secondary | ICD-10-CM | POA: Insufficient documentation

## 2021-05-07 DIAGNOSIS — R102 Pelvic and perineal pain: Secondary | ICD-10-CM | POA: Diagnosis not present

## 2021-05-10 DIAGNOSIS — Z419 Encounter for procedure for purposes other than remedying health state, unspecified: Secondary | ICD-10-CM | POA: Diagnosis not present

## 2021-05-14 ENCOUNTER — Other Ambulatory Visit: Payer: Self-pay

## 2021-05-14 ENCOUNTER — Ambulatory Visit (INDEPENDENT_AMBULATORY_CARE_PROVIDER_SITE_OTHER): Payer: Medicaid Other | Admitting: Obstetrics and Gynecology

## 2021-05-14 ENCOUNTER — Encounter: Payer: Self-pay | Admitting: Obstetrics and Gynecology

## 2021-05-14 VITALS — BP 124/72 | Ht 68.0 in | Wt 277.4 lb

## 2021-05-14 DIAGNOSIS — R102 Pelvic and perineal pain: Secondary | ICD-10-CM

## 2021-05-14 NOTE — Progress Notes (Signed)
Patient ID: Megan Bennett, female   DOB: 02-Aug-1985, 35 y.o.   MRN: [id number]  Reason for Consult: Follow-up   Referred by Megan Billings, NP  Subjective:     HPI:  Megan Bennett is a 35 y.o. female she is following up today regarding pelvic pain.  She reports that she continues to have severe pain around the time of her menstrual cycle.  New swab was negative for infection.  Pelvic ultrasound was normal.  She currently takes naproxen to try and manage her pain although she does not generally have relief.  She smokes cigarettes.  She reports that recently she was in such severe pain she had to lay on the couch while her family decorated the Christmas tree.  She feels like she is missing out on parts of her life because of her severe pelvic pain.  She has concerns regarding moving forward with a hysterectomy though and would like to consider surgery for further in the future.  Gynecological History  No LMP recorded. Patient has had an ablation.  Past Medical History:  Diagnosis Date   ADD (attention deficit disorder)    Arthritis    Fibromyalgia    GERD (gastroesophageal reflux disease)    Headache    MIGRAINES   Family History  Problem Relation Age of Onset   ADD / ADHD Mother    Arthritis Mother    Arthritis Father    ADD / ADHD Sister    Allergies Daughter    Breast cancer Maternal Aunt    Breast cancer Paternal Aunt    Arthritis Maternal Grandmother    Arthritis Maternal Grandfather    Cancer Maternal Grandfather    Past Surgical History:  Procedure Laterality Date   BREAST BIOPSY Left 07/03/2020   [place] bx, q marker, path pending   ESOPHAGOGASTRODUODENOSCOPY     LAPAROSCOPIC TUBAL LIGATION Bilateral 08/03/2018   Procedure: LAPAROSCOPIC TUBAL LIGATION;  Surgeon: Ward, Honor Loh, MD;  Location: [facility];  Service: Gynecology;  Laterality: Bilateral;   LAPAROSCOPY N/A 08/04/2018   Procedure: LAPAROSCOPY DIAGNOSTIC;  Surgeon: Ward, Honor Loh, MD;   Location: [facility];  Service: Gynecology;  Laterality: N/A;   SMALL BOWEL REPAIR N/A 08/04/2018   Procedure: SMALL BOWEL REPAIR;  Surgeon: Ward, Honor Loh, MD;  Location: [facility];  Service: Gynecology;  Laterality: N/A;   WISDOM TOOTH EXTRACTION Bilateral     Short Social History:  Social History   Tobacco Use   Smoking status: Every Day    Packs/day: 0.25    Years: 20.00    Pack years: 5.00    Types: Cigarettes   Smokeless tobacco: Never  Substance Use Topics   Alcohol use: Yes    Alcohol/week: 1.0 standard drink    Types: 1 Glasses of wine per week    Comment: OCC    Allergies  Allergen Reactions   Amoxicillin Rash    Did it involve swelling of the face/tongue/throat, SOB, or low BP? No Did it involve sudden or severe rash/hives, skin peeling, or any reaction on the inside of your mouth or nose? No Did you need to seek medical attention at a hospital or doctor's office? No When did it last happen?      14 + years If all above answers are "NO", may proceed with cephalosporin use.,    Hydrocodone Nausea And Vomiting   Wellbutrin [Bupropion] Other (See Comments)    shakey    Current Outpatient Medications  Medication Sig Dispense Refill  diphenhydrAMINE (BENADRYL) 25 mg capsule Take by mouth as needed.     ibuprofen (ADVIL) 800 MG tablet Take 1 tablet (800 mg total) by mouth every 8 (eight) hours as needed. 90 tablet 1   levothyroxine (SYNTHROID) 25 MCG tablet Take 1 tablet (25 mcg total) by mouth daily. 90 tablet 1   pantoprazole (PROTONIX) 40 MG tablet Take 1 tablet (40 mg total) by mouth daily. 30 tablet 2   PROAIR HFA 108 (90 Base) MCG/ACT inhaler Inhale 2 puffs into the lungs every 6 (six) hours as needed for wheezing or shortness of breath. 18 g 1   STIOLTO RESPIMAT 2.5-2.5 MCG/ACT AERS SMARTSIG:2 Puff(s) Via Inhaler Daily 4 g 1   SUMAtriptan (IMITREX) 100 MG tablet Take 100 mg by mouth daily as needed.     gabapentin (NEURONTIN) 100 MG capsule Take 1 capsule (100  mg total) by mouth 2 (two) times daily. Take 1 tab at bedtime for 1 week then increase to twice daily. 180 capsule 1   No current facility-administered medications for this visit.    Review of Systems  Constitutional: Negative for chills, fatigue, fever and unexpected weight change.  HENT: Negative for trouble swallowing.  Eyes: Negative for loss of vision.  Respiratory: Negative for cough, shortness of breath and wheezing.  Cardiovascular: Negative for chest pain, leg swelling, palpitations and syncope.  GI: Negative for abdominal pain, blood in stool, diarrhea, nausea and vomiting.  GU: Negative for difficulty urinating, dysuria, frequency and hematuria.  Musculoskeletal: Negative for back pain, leg pain and joint pain.  Skin: Negative for rash.  Neurological: Negative for dizziness, headaches, light-headedness, numbness and seizures.  Psychiatric: Negative for behavioral problem, confusion, depressed mood and sleep disturbance.       Objective:  Objective   Vitals:   05/14/21 1406  BP: 124/72  Weight: 277 lb 6.4 oz (125.8 kg)  Height: 5\' 8"  (1.727 m)   Body mass index is 42.18 kg/m.  Physical Exam Vitals and nursing note reviewed. Exam conducted with a chaperone present.  Constitutional:      Appearance: Normal appearance.  HENT:     Head: Normocephalic and atraumatic.  Eyes:     Extraocular Movements: Extraocular movements intact.     Pupils: Pupils are equal, round, and reactive to light.  Cardiovascular:     Rate and Rhythm: Normal rate and regular rhythm.  Pulmonary:     Effort: Pulmonary effort is normal.     Breath sounds: Normal breath sounds.  Abdominal:     General: Abdomen is flat.     Palpations: Abdomen is soft.  Musculoskeletal:     Cervical back: Normal range of motion.  Skin:    General: Skin is warm and dry.  Neurological:     General: No focal deficit present.     Mental Status: She is alert and oriented to person, place, and time.   Psychiatric:        Behavior: Behavior normal.        Thought Content: Thought content normal.        Judgment: Judgment normal.    Assessment/Plan:     35 year old with pelvic pain and dysmenorrhea status post endometrial ablation. Discussed options such as naproxen usage, TENS unit usage, acupuncture, and warm baths. Provide her with information resources regarding hysterectomy she should she should choose to pursue surgical invention intervention in the future.  Plan follow-up as needed.  More than 20 minutes were spent face to face with the patient in the  room, reviewing the medical record, labs and images, and coordinating care for the patient. The plan of management was discussed in detail and counseling was provided.      Adrian Prows MD [facility], [facility] 05/14/2021 2:31 PM

## 2021-05-14 NOTE — Patient Instructions (Signed)
Supracervical Hysterectomy A supracervical hysterectomy, also called a subtotal hysterectomy, is a surgery to remove the top part of the uterus while leaving the lowest part of the uterus (cervix) in place. It may be done to treat these problems: Fibroid tumors. These are noncancerous growths in the uterus that cause symptoms. Endometriosis. This is when the tissue that lines the uterus is growing outside of its normal location. Certain types of uterine cancer. This surgery is usually done using a technique called laparoscopy, where a thin, lighted tube with a camera (laparoscope) is inserted into one of three or four small incisions made in the abdomen. The technique is minimally invasive, using small incisions instead of a large incision. The result is less pain, lower risk of infection, and shorter recovery time. Sometimes during this surgery, the fallopian tubes and ovaries may also be removed. After the surgery, menstrual periods will stop, and it will no longer be possible to get pregnant. Tell a health care provider about: Any allergies you have. All medicines you are taking, including vitamins, herbs, eye drops, creams, and over-the-counter medicines. Any problems you or family members have had with anesthetic medicines. Any blood disorders you have. Any surgeries you have had. Any medical conditions you have. Whether you are pregnant or may be pregnant. What are the risks? Generally, this is a safe procedure. However, problems may occur, including: Bleeding. Infection. Blood clots in the legs or lungs. Having to change from small incisions to open abdominal surgery. Additional surgery later to remove the cervix. Allergic reactions to medicines. Damage to other structures or organs. What happens before the procedure? Staying hydrated Follow instructions from your health care provider about hydration, which may include: Up to 2 hours before the procedure - you may continue to drink  clear liquids, such as water, clear fruit juice, black coffee, and plain tea.  Eating and drinking restrictions Follow instructions from your health care provider about eating and drinking, which may include: 8 hours before the procedure - stop eating heavy meals or foods, such as meat, fried foods, or fatty foods. 6 hours before the procedure - stop eating light meals or foods, such as toast or cereal. 6 hours before the procedure - stop drinking milk or drinks that contain milk. 2 hours before the procedure - stop drinking clear liquids. Medicines Ask your health care provider about: Changing or stopping your regular medicines. This is especially important if you are taking diabetes medicines or blood thinners. Taking medicines such as aspirin and ibuprofen. These medicines can thin your blood. Do not take these medicines unless your health care provider tells you to take them. Taking over-the-counter medicines, vitamins, herbs, and supplements. You may be asked to take a medicine to empty your colon (bowel preparation). General instructions Do not use any products that contain nicotine or tobacco for at least 4 weeks before the procedure. These products include cigarettes, chewing tobacco, and vaping devices, such as e-cigarettes. If you need help quitting, ask your health care provider. Do not drink beverages that contain alcohol prior to the procedure. Alcohol can increase your risk of bleeding and complications. If you need help stopping, ask your health care provider. This procedure can affect the way you feel about yourself. Talk with your health care provider about the physical and emotional changes that a hysterectomy may cause. Plan to have a responsible adult take you home from the hospital or clinic. Plan to have a responsible adult care for you for the time you are  told after you leave the hospital or clinic. This is important. Ask your health care provider: How your surgery site  will be marked. What steps will be taken to help prevent infection. These steps may include: Removing hair at the surgery site. Washing skin with a germ-killing soap. Taking antibiotic medicine. What happens during the procedure? An IV will be inserted into one of your veins. You will be given one or more of the following: A medicine to help you relax (sedative). A medicine to make you fall asleep (general anesthetic). A medicine that is injected into your spine to numb the area below and slightly above the injection site (spinal anesthetic). A medicine that is injected into an area of your body to numb everything below the injection site (regional anesthetic). Tight fitting (compression) stockings will be placed on your legs to promote circulation. A gas will be used to inflate your abdomen. This will allow your surgeon to look inside your abdomen and perform the surgery. Three or four small incisions will be made in your abdomen. A laparoscope will be inserted into one of your incisions. The camera on the laparoscope will send pictures to a TV screen in the operating room, making it easy for the surgeon to have a good view of your abdomen. Surgical instruments will be inserted through the other incisions. The uterus will be cut into small pieces and removed through these incisions. Your incisions will be closed with stitches (sutures), skin glue, or adhesive strips. The procedure may vary among health care providers and hospitals. What happens after the procedure? Your blood pressure, heart rate, breathing rate, and blood oxygen level will be monitored until you leave the hospital or clinic. You may have some discomfort, tenderness, swelling, and bruising at the surgery site. You will be given pain medicine for use in the hospital and at home after you are discharged. You will be encouraged to walk as soon as possible. You will also use a device or do breathing exercises to keep your lungs  clear. You may need to use a sanitary napkin for discharge from the vagina. Summary A supracervical hysterectomy is a surgery to remove the top part of the uterus while leaving the lowest part of the uterus (cervix) in place. This surgery is usually done using a minimally invasive technique, which means it is done through small incisions instead of a large incision. The minimally invasive technique results in less pain, lower risk of infection, and shorter recovery time. Plan to have a responsible adult take you home from the hospital or clinic. This information is not intended to replace advice given to you by your health care provider. Make sure you discuss any questions you have with your health care provider. Document Revised: 02/07/2020 Document Reviewed: 02/07/2020 Elsevier Patient Education  2022 [place]. Laparoscopically Assisted Vaginal Hysterectomy, Care After The following information offers guidance on how to care for yourself after your procedure. Your health care provider may also give you more specific instructions. If you have problems or questions, contact your health care provider. What can I expect after the procedure? After the procedure, it is common to have: Soreness and numbness in your incision areas. Abdominal pain. You will be given pain medicine to control it. Vaginal bleeding and discharge. You will need to use a sanitary pad after this procedure. Tiredness (fatigue). Poor appetite. Less interest in sex. Feelings of sadness or other emotions. If your ovaries were also removed, it is common to have symptoms of  menopause, such as hot flashes, night sweats, and lack of sleep (insomnia). Follow these instructions at home: Medicines Take over-the-counter and prescription medicines only as told by your health care provider. Do not take aspirin or NSAIDs, such as ibuprofen. These medicines can cause bleeding. Ask your health care provider if the medicine  prescribed to you: Requires you to avoid driving or using heavy machinery. Can cause constipation. You may need to take these actions to prevent or treat constipation: Drink enough fluid to keep your urine pale yellow. Take over-the-counter or prescription medicines. Eat foods that are high in fiber, such as beans, whole grains, and fresh fruits and vegetables. Limit foods that are high in fat and processed sugars, such as fried or sweet foods. Incision care  Follow instructions from your health care provider about how to take care of your incisions. Make sure you: Wash your hands with soap and water for at least 20 seconds before and after you change your bandage (dressing). If soap and water are not available, use hand sanitizer. Change your dressing as told by your health care provider. Leave stitches (sutures), skin glue, or adhesive strips in place. These skin closures may need to stay in place for 2 weeks or longer. If adhesive strip edges start to loosen and curl up, you may trim the loose edges. Do not remove adhesive strips completely unless your health care provider tells you to do that. Check your incision areas every day for signs of infection. Check for: More redness, swelling, or pain. Fluid or blood. Warmth. Pus or a bad smell. Activity  Rest as told by your healthcare provider. Return to your normal activities as told by your health care provider. Ask your health care provider what activities are safe for you. Avoid sitting for a long time without moving. Get up to take short walks every 1-2 hours. This is important to improve blood flow and breathing. Ask for help if you feel weak or unsteady. Do not lift anything that is heavier than 10 lb (4.5 kg), or the limit that you are told, until your health care provider says that it is safe. If you were given a sedative during the procedure, it can affect you for several hours. Do not drive or operate machinery until your health  care provider says that it is safe. Lifestyle Do not use any products that contain nicotine or tobacco. These products include cigarettes, chewing tobacco, and vaping devices, such as e-cigarettes. These can delay healing after surgery. If you need help quitting, ask your health care provider. Do not drink alcohol until your health care provider approves. General instructions  Do not douche, use tampons, or have sex for at least 6 weeks, or as told by your health care provider. If you struggle with physical or emotional changes after your procedure, speak with your health care provider or a therapist. Do not take baths, swim, or use a hot tub until your health care provider approves. You may only be allowed to take showers for 2-3 weeks. Keep your dressing dry until your health care provider says it can be removed. Try to have someone at home with you for the first 1-2 weeks to help with your daily chores. Wear compression stockings as told by your health care provider. These stockings help to prevent blood clots and reduce swelling in your legs. Keep all follow-up visits. This is important. Contact a health care provider if: You have any of these signs of infection: More redness, swelling,  warmth, or pain around an incision. Fluid or blood coming from an incision. Pus or a bad smell coming from an incision. Chills or a fever. An incision opens. Your pain medicine is not helping. You feel dizzy or light-headed. You have pain or bleeding when you urinate. You have nausea and vomiting that does not go away. You have pus, or a bad-smelling discharge coming from your vagina. Get help right away if: You have a fever and your symptoms suddenly get worse. You have severe abdominal pain. You have chest pain. You have shortness of breath. You faint. You have pain, swelling, or redness in your leg. You have heavy vaginal bleeding and blood clots, soaking through a sanitary pad in less than 1  hour. These symptoms may represent a serious problem that is an emergency. Do not wait to see if the symptoms will go away. Get medical help right away. Call your local emergency services (911 in the [place] Do not drive yourself to the hospital. Summary After the procedure, it is common to have abdominal pain and vaginal bleeding. Wear a sanitary pad for vaginal discharge or bleeding. You should not drive or lift heavy objects until your health care provider says that it is safe. Contact your health care provider if you have any symptoms of infection, heavy vaginal bleeding, nausea, vomiting, or shortness of breath. This information is not intended to replace advice given to you by your health care provider. Make sure you discuss any questions you have with your health care provider. Document Revised: 01/28/2020 Document Reviewed: 01/28/2020 Elsevier Patient Education  2022 [place]. Hysterectomy Information A hysterectomy is a surgery in which the uterus is removed. The lowest part of the uterus (cervix), which opens into the vagina, may be removed as well. In some cases, the fallopian tubes, the ovaries,  or both the fallopian tubes and the ovaries may also be removed. This procedure may be done to treat different medical problems. It may also be done to help transgender men feel more masculine. After the procedure, a woman will no longer have menstrual periods and will not be able to become pregnant (sterile). What are the reasons for a hysterectomy? There are many reasons why a person might have this procedure. They include: Persistent, abnormal vaginal bleeding. Long-term (chronic) pelvic pain or infection. Endometriosis. This is when the lining of the uterus (endometrium) starts to grow outside the uterus. Adenomyosis. This is when the endometrium starts to grow in the muscle of the uterus. Pelvic organ prolapse. This is a condition in which the uterus falls down into the  vagina. Noncancerous growths in the uterus (uterine fibroids) that cause symptoms. The presence of precancerous cells. Cervical or uterine cancer. Sex change. This helps a transgender man complete his female identity. What are the different types of hysterectomy? There are three different types of hysterectomy: Supracervical hysterectomy. In this type, the top part of the uterus is removed, but not the cervix. Total hysterectomy. In this type, the uterus and cervix are removed. Radical hysterectomy. In this type, the uterus, the cervix, and the tissue that holds the uterus in place (parametrium) are removed. What are the different ways a hysterectomy can be performed? There are many different ways a hysterectomy can be performed, including: Abdominal hysterectomy. In this type, an incision is made in the abdomen. The uterus is removed through this incision. Vaginal hysterectomy. In this type, an incision is made in the vagina. The uterus is removed through this incision. There are  no abdominal incisions. Conventional laparoscopic hysterectomy. In this type, 3 or 4 small incisions are made in the abdomen. A thin, lighted tube with a camera (laparoscope) is inserted into one of the incisions. Other tools are put through the other incisions. The uterus is cut into small pieces. The small pieces are removed through the incisions or the vagina. Laparoscopically assisted vaginal hysterectomy (LAVH). In this type, 3 or 4 small incisions are made in the abdomen. Part of the surgery is performed laparoscopically and the other part is done vaginally. The uterus is removed through the vagina. Robot-assisted laparoscopic hysterectomy. In this type, a laparoscope and other tools are inserted into 3 or 4 small incisions in the abdomen. A computer-controlled device is used to give the surgeon a 3D image and to help control the surgical instruments. This allows for more precise movements of surgical instruments. The  uterus is cut into small pieces and removed through the incisions or the vagina. Discuss the options with your health care provider to determine which type is the right one for you. What are the risks of this surgery? Generally, this is a safe procedure. However, problems may occur, including: Bleeding and risk of blood transfusion. Tell your health care provider if you do not want to receive any blood products. Blood clots in the legs or lung. Infection. Damage to nearby structures or organs. Allergic reactions to medicines. Having to change to an abdominal hysterectomy after starting a less invasive technique. What to expect after a hysterectomy You will be given pain medicine. You may need to stay in the hospital for 1-2 days to recover, depending on the type of hysterectomy you had. You will need to have someone with you for the first 3-5 days after you go home. You will need to follow up with your surgeon in 2-4 weeks after surgery to evaluate your progress. If the ovaries are removed, you will have early menopause symptoms such as hot flashes, night sweats, and insomnia. If you had a hysterectomy for a problem that was not cancer or a condition that could not lead to cancer, then you no longer need Pap tests. However, even if you no longer need a Pap test, get regular pelvic exams to make sure no other problems are developing. Questions to ask your health care provider Is a hysterectomy medically necessary? Do I have other treatment options for my condition? What are my options for hysterectomy procedure? What organs and tissues need to be removed? What are the risks? What are the benefits? How long will I need to stay in the hospital after the procedure? How long will I need to recover at home? What symptoms can I expect after the procedure? Summary A hysterectomy is a surgery in which the uterus is removed. The fallopian tubes, the ovaries, or both may be removed as well. This  procedure may be done to treat different medical problems. It may also be done to complete your female identity during a sex change. After the procedure, a woman will no longer have a menstrual period and will not be able to become pregnant. There are three types of hysterectomy. Discuss with your health care provider which options are right for you. This is a safe procedure, though there are some risks. Risks include infection, bleeding, blood clots, or damage to nearby organs. This information is not intended to replace advice given to you by your health care provider. Make sure you discuss any questions you have with your health care  provider. Document Revised: 03/26/2019 Document Reviewed: 03/26/2019 Elsevier Patient Education  [place].

## 2021-05-20 NOTE — Progress Notes (Deleted)
There were no vitals taken for this visit.   Subjective:    Patient ID: Megan Bennett, female    DOB: [birth year], 35 y.o.   MRN: [id number]  HPI: Megan Bennett is a 35 y.o. female  No chief complaint on file.  Patient states she did restart the Levothyroxine but she hasn't seen any difference.  She is taking it with other medications currently.    PAIN Patient states she is taking Ibuprofen 800mg  1-2 times per day to help with Fibromyalgia and other pain.    Relevant past medical, surgical, family and social history reviewed and updated as indicated. Interim medical history since our last visit reviewed. Allergies and medications reviewed and updated.  Review of Systems  Musculoskeletal:  Positive for myalgias.   Per HPI unless specifically indicated above     Objective:    There were no vitals taken for this visit.  Wt Readings from Last 3 Encounters:  05/14/21 277 lb 6.4 oz (125.8 kg)  04/16/21 278 lb 6.4 oz (126.3 kg)  03/26/21 280 lb (127 kg)    Physical Exam Vitals and nursing note reviewed.  Constitutional:      General: She is not in acute distress.    Appearance: Normal appearance. She is obese. She is not ill-appearing, toxic-appearing or diaphoretic.  HENT:     Head: Normocephalic.     Right Ear: External ear normal.     Left Ear: External ear normal.     Nose: Nose normal.     Mouth/Throat:     Mouth: Mucous membranes are moist.     Pharynx: Oropharynx is clear.  Eyes:     General:        Right eye: No discharge.        Left eye: No discharge.     Extraocular Movements: Extraocular movements intact.     Conjunctiva/sclera: Conjunctivae normal.     Pupils: Pupils are equal, round, and reactive to light.  Cardiovascular:     Rate and Rhythm: Normal rate and regular rhythm.     Heart sounds: No murmur heard. Pulmonary:     Effort: Pulmonary effort is normal. No respiratory distress.     Breath sounds: Normal breath sounds. No  wheezing or rales.  Musculoskeletal:     Cervical back: Normal range of motion and neck supple.  Skin:    General: Skin is warm and dry.     Capillary Refill: Capillary refill takes less than 2 seconds.  Neurological:     General: No focal deficit present.     Mental Status: She is alert and oriented to person, place, and time. Mental status is at baseline.  Psychiatric:        Mood and Affect: Mood normal.        Behavior: Behavior normal.        Thought Content: Thought content normal.        Judgment: Judgment normal.    Results for orders placed or performed in visit on 04/16/21  NuSwab Vaginitis Plus (VG+)  Result Value Ref Range   Atopobium vaginae Low - 0 Score   BVAB 2 Low - 0 Score   Megasphaera 1 Low - 0 Score   Candida albicans, NAA Negative Negative   Candida glabrata, NAA Negative Negative   Trich vag by NAA Negative Negative   Chlamydia trachomatis, NAA Negative Negative   Neisseria gonorrhoeae, NAA Negative Negative      Assessment & Plan:   Problem  List Items Addressed This Visit       Other   Fibromyalgia - Primary     Follow up plan: No follow-ups on file.

## 2021-05-21 ENCOUNTER — Ambulatory Visit: Payer: Medicaid Other | Admitting: Nurse Practitioner

## 2021-05-21 DIAGNOSIS — M797 Fibromyalgia: Secondary | ICD-10-CM

## 2021-06-05 ENCOUNTER — Other Ambulatory Visit: Payer: Self-pay | Admitting: Nurse Practitioner

## 2021-06-05 NOTE — Telephone Encounter (Signed)
Duplicate request. See other request on 06/05/21

## 2021-06-05 NOTE — Telephone Encounter (Signed)
Requested medication (s) are due for refill today: yes  Requested medication (s) are on the active medication list: yes  Last refill:  03/19/21 with 1 refill  Future visit scheduled: no  Notes to clinic:  Please review for refill. Refill not delegated per protocol    Requested Prescriptions  Pending Prescriptions Disp Refills   [place] 2.5-2.5 MCG/ACT AERS [Pharmacy Med Name: STIOLTO RESPIMAT 2.5/2.5MCG INH 4GM] 4 g 1    Sig: INHALE 2 PUFFS BY MOUTH DAILY     Off-Protocol Failed - 06/05/2021  7:13 AM      Failed - Medication not assigned to a protocol, review manually.      Passed - Valid encounter within last 12 months    Recent Outpatient Visits           2 months ago Menstrual cramp   [place], NP   2 months ago [place], NP   4 months ago Acquired hypothyroidism   [facility] [place], NP

## 2021-06-07 ENCOUNTER — Ambulatory Visit: Payer: Self-pay | Admitting: *Deleted

## 2021-06-07 NOTE — Telephone Encounter (Signed)
Summary: chills, fever, doesnt sknow temp, body ache, stopped up, fatigued   Patient was dx with covid Tuesday, has fever, chills, body ache, stopped up, headache, fatigued.   She is asking for medicine to be sent to [facility] in [place] in [place]. The pharmacy she uses doesnt have the med for covid. She didn't want to make an appt.       Chief Complaint: Cough, SOB Symptoms: cough, SOB, wheezing, Frequency: Constant Pertinent Negatives: Patient denies  Disposition: [x] ED /[] Urgent Care (no appt availability in office) / [] Appointment(In office/virtual)/ []  [place] [facility]/ [] Home Care/ [] Refused Recommended Disposition /[] [place] [facility]/ []  Follow-up with PCP Additional Notes: Audible wheezing during call, SOB with speaking . States "Feels like when I was close to death,' Declines ED. Reiterated need for ED eval declines. States all of her family in household are positive. "Just want the meds. "Assured pt NT would route to practice for PCPs review. Offered EMS. "I'll call tomorrow." Pt hung up.  Reason for Disposition  MODERATE difficulty breathing (e.g., speaks in phrases, SOB even at rest, pulse 100-120)  Answer Assessment - Initial Assessment Questions 1. COVID-19 DIAGNOSIS: "Who made your COVID-19 diagnosis?" "Was it confirmed by a positive lab test or self-test?" If not diagnosed by a doctor (or NP/PA), ask "Are there lots of cases (community spread) where you live?" Note: See public health department website, if unsure.     Tuesday home 2. COVID-19 EXPOSURE: "Was there any known exposure to COVID before the symptoms began?" CDC Definition of close contact: within 6 feet (2 meters) for a total of 15 minutes or more over a 24-hour period.      No 3. ONSET: "When did the COVID-19 symptoms start?"      Tuesday 4. WORST SYMPTOM: "What is your worst symptom?" (e.g., cough, fever, shortness of breath, muscle aches)     SOB 5. COUGH: "Do you have a cough?" If Yes, ask:  "How bad is the cough?"       Occasionally productive,  6. FEVER: "Do you have a fever?" If Yes, ask: "What is your temperature, how was it measured, and when did it start?"     Subjective, chills, sweating 7. RESPIRATORY STATUS: "Describe your breathing?" (e.g., shortness of breath, wheezing, unable to speak)      SOB at rest, with speaking, wheezing 8. BETTER-SAME-WORSE: "Are you getting better, staying the same or gettingworse worse compared to yesterday?"  If getting worse, ask, "In what way?"      9. HIGH RISK DISEASE: "Do you have any chronic medical problems?" (e.g., asthma, heart or lung disease, weak immune system, obesity, etc.)      10. VACCINE: "Have you had the COVID-19 vaccine?" If Yes, ask: "Which one, how many shots, when did you get it?"       no 11. BOOSTER: "Have you received your COVID-19 booster?" If Yes, ask: "Which one and when did you get it?"     no    13. OTHER SYMPTOMS: "Do you have any other symptoms?"  (e.g., chills, fatigue, headache, loss of smell or taste, muscle pain, sore throat)       Body aches, sore throat, headache 14. O2 SATURATION MONITOR:  "Do you use an oxygen saturation monitor (pulse oximeter) at home?" If Yes, ask "What is your reading (oxygen level) today?" "What is your usual oxygen saturation reading?" (e.g., 95%)  Protocols used: Coronavirus (COVID-19) Diagnosed or Suspected-A-AH

## 2021-06-08 ENCOUNTER — Telehealth (INDEPENDENT_AMBULATORY_CARE_PROVIDER_SITE_OTHER): Payer: Medicaid Other | Admitting: Nurse Practitioner

## 2021-06-08 ENCOUNTER — Other Ambulatory Visit: Payer: Self-pay

## 2021-06-08 ENCOUNTER — Encounter: Payer: Self-pay | Admitting: Nurse Practitioner

## 2021-06-08 VITALS — Ht 68.0 in | Wt 275.0 lb

## 2021-06-08 DIAGNOSIS — D229 Melanocytic nevi, unspecified: Secondary | ICD-10-CM

## 2021-06-08 DIAGNOSIS — U071 COVID-19: Secondary | ICD-10-CM

## 2021-06-08 MED ORDER — MOLNUPIRAVIR EUA 200MG CAPSULE: 4.0000 | ORAL_CAPSULE | Freq: Two times a day (BID) | ORAL | 0 refills | Status: AC

## 2021-06-08 MED ORDER — METHYLPREDNISOLONE 4 MG PO TBPK: ORAL_TABLET | ORAL | 0 refills | Status: DC

## 2021-06-08 NOTE — Progress Notes (Signed)
Ht 5\' 8"  (1.727 m)    Wt 275 lb (124.7 kg)    Breastfeeding No    BMI 41.81 kg/m    Subjective:    Patient ID: Megan Bennett, female    DOB: [birth year], 35 y.o.   MRN: [id number]  HPI: Megan Bennett is a 35 y.o. female  Chief Complaint  Patient presents with   Covid Positive    Positive home Covid test 06/05/21; symptoms of sore throat, congestion, dry cough, headache, shortness of breath on exertion, fatigue   COVID POSITIVE Positive home Covid test 06/05/21; Symptoms started on 06/04/2021.  Symptoms of sore throat, congestion, dry cough, headache, shortness of breath on exertion, wheezing, chest pain with her cough, fatigue.   Relevant past medical, surgical, family and social history reviewed and updated as indicated. Interim medical history since our last visit reviewed. Allergies and medications reviewed and updated.  Review of Systems  Constitutional:  Positive for fatigue. Negative for fever.  HENT:  Positive for congestion. Negative for dental problem, ear pain, postnasal drip, rhinorrhea, sinus pressure, sinus pain, sneezing and sore throat.   Respiratory:  Positive for cough, shortness of breath and wheezing.   Cardiovascular:  Positive for chest pain.  Gastrointestinal:  Negative for vomiting.  Skin:  Negative for rash.  Neurological:  Positive for headaches.   Per HPI unless specifically indicated above     Objective:    Ht 5\' 8"  (1.727 m)    Wt 275 lb (124.7 kg)    Breastfeeding No    BMI 41.81 kg/m   Wt Readings from Last 3 Encounters:  06/08/21 275 lb (124.7 kg)  05/14/21 277 lb 6.4 oz (125.8 kg)  04/16/21 278 lb 6.4 oz (126.3 kg)    Physical Exam Vitals and nursing note reviewed.  HENT:     Head: Normocephalic.     Right Ear: Hearing normal.     Left Ear: Hearing normal.     Nose: Nose normal.  Eyes:     Pupils: Pupils are equal, round, and reactive to light.  Pulmonary:     Effort: Pulmonary effort is normal. No respiratory  distress.  Neurological:     Mental Status: She is alert.  Psychiatric:        Mood and Affect: Mood normal.        Behavior: Behavior normal.        Thought Content: Thought content normal.        Judgment: Judgment normal.    Results for orders placed or performed in visit on 04/16/21  NuSwab Vaginitis Plus (VG+)  Result Value Ref Range   Atopobium vaginae Low - 0 Score   BVAB 2 Low - 0 Score   Megasphaera 1 Low - 0 Score   Candida albicans, NAA Negative Negative   Candida glabrata, NAA Negative Negative   Trich vag by NAA Negative Negative   Chlamydia trachomatis, NAA Negative Negative   Neisseria gonorrhoeae, NAA Negative Negative      Assessment & Plan:   Problem List Items Addressed This Visit   None Visit Diagnoses     COVID-19    -  Primary   Complete course ofPrednisone and Molnupiravir. Discussed quarantine and masking. Discussed s/s to monitor for and when to follow up or seek higher level of care   Relevant Medications   molnupiravir EUA (LAGEVRIO) 200 mg CAPS capsule   Atypical mole       Patient would like to see a Dermatologist to  have her skin checked.     Relevant Orders   Ambulatory referral to Dermatology        Follow up plan: Return if symptoms worsen or fail to improve.   This visit was completed via MyChart due to the restrictions of the COVID-19 pandemic. All issues as above were discussed and addressed. Physical exam was done as above through visual confirmation on MyChart. If it was felt that the patient should be evaluated in the office, they were directed there. The patient verbally consented to this visit. Location of the patient: Home Location of the provider: Office Those involved with this call:  Provider: Jon Billings, NP CMA: Marliss Coots Front Desk/Registration: Myrlene Broker This encounter was conducted via video.  I spent 15 dedicated to the care of this patient on the date of this encounter to include previsit review of  20, face to face time with the patient, and post visit ordering of testing.

## 2021-06-10 DIAGNOSIS — Z419 Encounter for procedure for purposes other than remedying health state, unspecified: Secondary | ICD-10-CM | POA: Diagnosis not present

## 2021-07-11 DIAGNOSIS — Z419 Encounter for procedure for purposes other than remedying health state, unspecified: Secondary | ICD-10-CM | POA: Diagnosis not present

## 2021-07-23 ENCOUNTER — Other Ambulatory Visit: Payer: Self-pay | Admitting: Nurse Practitioner

## 2021-07-24 NOTE — Telephone Encounter (Signed)
Requested medication (s) are due for refill today: expired medication  Requested medication (s) are on the active medication list: yes  Last refill:  03/19/21-06/17/21 #30 2 refills  Future visit scheduled: no  Notes to clinic:  expired medication . Do you want to renew Rx?     Requested Prescriptions  Pending Prescriptions Disp Refills   pantoprazole (PROTONIX) 40 MG tablet [Pharmacy Med Name: PANTOPRAZOLE 40MG  TABLETS] 30 tablet 2    Sig: TAKE 1 TABLET(40 MG) BY MOUTH DAILY     Gastroenterology: Proton Pump Inhibitors Passed - 07/23/2021  7:30 PM      Passed - Valid encounter within last 12 months    Recent Outpatient Visits           1 month ago [place], NP   4 months ago Menstrual cramp   [place], NP   4 months ago [place], NP   6 months ago Acquired hypothyroidism   [facility] Jon Billings, NP       Future Appointments             In 4 months Ralene Bathe, MD [place]

## 2021-08-05 ENCOUNTER — Other Ambulatory Visit: Payer: Self-pay | Admitting: Nurse Practitioner

## 2021-08-07 NOTE — Telephone Encounter (Signed)
Requested medication (s) are due for refill today:   Yes  Requested medication (s) are on the active medication list:   Yes  Future visit scheduled:   Yes in 6 days with Santiago Glad   Last ordered: 02/05/2021 #90, 1 refill  Returned because protocol criteria not met.   TSH out of range.     Requested Prescriptions  Pending Prescriptions Disp Refills   levothyroxine (SYNTHROID) 25 MCG tablet [Pharmacy Med Name: LEVOTHYROXINE 0.025MG (25MCG) TAB] 90 tablet 1    Sig: TAKE 1 TABLET(25 MCG) BY MOUTH DAILY     Endocrinology:  Hypothyroid Agents Failed - 08/05/2021  9:44 AM      Failed - TSH in normal range and within 360 days    TSH  Date Value Ref Range Status  03/19/2021 4.910 (H) 0.450 - 4.500 uIU/mL Final          Passed - Valid encounter within last 12 months    Recent Outpatient Visits           2 months ago [place], Karen, NP   4 months ago Menstrual cramp   [place], NP   4 months ago [place], Karen, NP   6 months ago Acquired hypothyroidism   [facility] Jon Billings, NP       Future Appointments             In 6 days Jon Billings, NP [facility] [place]   In 3 months Ralene Bathe, MD [place]

## 2021-08-07 NOTE — Telephone Encounter (Signed)
Pt scheduled 3.6

## 2021-08-07 NOTE — Telephone Encounter (Signed)
Patient due for follow up.

## 2021-08-08 DIAGNOSIS — Z419 Encounter for procedure for purposes other than remedying health state, unspecified: Secondary | ICD-10-CM | POA: Diagnosis not present

## 2021-08-13 ENCOUNTER — Ambulatory Visit (INDEPENDENT_AMBULATORY_CARE_PROVIDER_SITE_OTHER): Payer: Medicaid Other | Admitting: Nurse Practitioner

## 2021-08-13 ENCOUNTER — Encounter: Payer: Self-pay | Admitting: Nurse Practitioner

## 2021-08-13 ENCOUNTER — Other Ambulatory Visit: Payer: Self-pay

## 2021-08-13 VITALS — BP 116/69 | HR 73 | Temp 98.8°F | Wt 274.4 lb

## 2021-08-13 DIAGNOSIS — E039 Hypothyroidism, unspecified: Secondary | ICD-10-CM | POA: Insufficient documentation

## 2021-08-13 DIAGNOSIS — F419 Anxiety disorder, unspecified: Secondary | ICD-10-CM

## 2021-08-13 DIAGNOSIS — M25562 Pain in left knee: Secondary | ICD-10-CM | POA: Diagnosis not present

## 2021-08-13 DIAGNOSIS — M797 Fibromyalgia: Secondary | ICD-10-CM

## 2021-08-13 MED ORDER — DULOXETINE HCL 20 MG PO CPEP: 20.0000 mg | ORAL_CAPSULE | Freq: Every day | ORAL | 0 refills | Status: DC

## 2021-08-13 MED ORDER — IBUPROFEN 800 MG PO TABS: 800.0000 mg | ORAL_TABLET | Freq: Three times a day (TID) | ORAL | 1 refills | Status: DC | PRN

## 2021-08-13 NOTE — Assessment & Plan Note (Signed)
Chronic. Ongoing. Uses ibuprofen for pain control.  Discussed decreasing usage due to risk of kidney injury.  CMP ordered at visit today. Follow up in 6 months for reevaluation. ?

## 2021-08-13 NOTE — Assessment & Plan Note (Signed)
Chronic.  Controlled.  Continue with current medication regimen on Levothyroxine '25mg'$  daily.  Labs ordered today.  Return to clinic in 6 months for reevaluation.  Call sooner if concerns arise.  ? ?

## 2021-08-13 NOTE — Progress Notes (Signed)
? ?BP 116/69   Pulse 73   Temp 98.8 ?F (37.1 ?C) (Oral)   Wt 274 lb 6.4 oz (124.5 kg)   SpO2 97%   BMI 41.72 kg/m?   ? ?Subjective:  ? ? Patient ID: Megan Bennett, female    DOB: [birth year], 36 y.o.   MRN: [id number] ? ?HPI: ?Megan Bennett is a 36 y.o. female ? ?Chief Complaint  ?Patient presents with  ? Fibromyalgia  ? Knee Pain  ?  Pt states she has been having L knee pain for the past month. States she had a fall and thinks she hurt it then.   ? Carpal Tunnel  ?  Pt states her carpal tunnel in her hands is getting worse   ? ?HYPOTHYROIDISM ?Thyroid control status:controlled ?Satisfied with current treatment? yes ?Medication side effects: no ?Medication compliance: excellent compliance ?Etiology of hypothyroidism:  ?Recent dose adjustment:no ?Fatigue: yes ?Cold intolerance: no ?Heat intolerance: no ?Weight gain: no ?Weight loss: no ?Constipation: no ?Diarrhea/loose stools: no ?Palpitations: no ?Lower extremity edema: no ?Anxiety/depressed mood: no ? ?KNEE PAIN ?Patient states she fell about a month ago.  She tripped over something.  Since then she has been having pain.  Some days are worse than others.  Hears popping at times.  Denies swelling.  It is her left knee.  ? ? ?ANXIETY ?Patient states that her anxiety has been bad lately.  She has difficulty leaving her house.  She feels more anxious when some of her family is around.  ? ?GAD 7 : Generalized Anxiety Score 08/13/2021 06/08/2021 04/16/2021  ?Nervous, Anxious, on Edge '2 3 1  ' ?Control/stop worrying 2 2 0  ?Worry too much - different things 2 2 0  ?Trouble relaxing '2 2 1  ' ?Restless '1 2 1  ' ?Easily annoyed or irritable '1 3 1  ' ?Afraid - awful might happen 1 0 0  ?Total GAD 7 Score '11 14 4  ' ?Anxiety Difficulty Somewhat difficult Very difficult Somewhat difficult  ? ? ? ?Relevant past medical, surgical, family and social history reviewed and updated as indicated. Interim medical history since our last visit reviewed. ?Allergies and medications  reviewed and updated. ? ?Review of Systems  ?Constitutional:  Negative for fatigue and unexpected weight change.  ?Cardiovascular:  Negative for palpitations and leg swelling.  ?Gastrointestinal:  Negative for constipation and diarrhea.  ?Endocrine: Negative for cold intolerance and heat intolerance.  ?Psychiatric/Behavioral:  Negative for dysphoric mood. The patient is not nervous/anxious.   ? ?Per HPI unless specifically indicated above ? ?   ?Objective:  ?  ?BP 116/69   Pulse 73   Temp 98.8 ?F (37.1 ?C) (Oral)   Wt 274 lb 6.4 oz (124.5 kg)   SpO2 97%   BMI 41.72 kg/m?   ?Wt Readings from Last 3 Encounters:  ?08/13/21 274 lb 6.4 oz (124.5 kg)  ?06/08/21 275 lb (124.7 kg)  ?05/14/21 277 lb 6.4 oz (125.8 kg)  ?  ?Physical Exam ?Vitals and nursing note reviewed.  ?Constitutional:   ?   General: She is not in acute distress. ?   Appearance: Normal appearance. She is obese. She is not ill-appearing, toxic-appearing or diaphoretic.  ?HENT:  ?   Head: Normocephalic.  ?   Right Ear: External ear normal.  ?   Left Ear: External ear normal.  ?   Nose: Nose normal.  ?   Mouth/Throat:  ?   Mouth: Mucous membranes are moist.  ?   Pharynx: Oropharynx is clear.  ?Eyes:  ?  General:     ?   Right eye: No discharge.     ?   Left eye: No discharge.  ?   Extraocular Movements: Extraocular movements intact.  ?   Conjunctiva/sclera: Conjunctivae normal.  ?   Pupils: Pupils are equal, round, and reactive to light.  ?Cardiovascular:  ?   Rate and Rhythm: Normal rate and regular rhythm.  ?   Heart sounds: No murmur heard. ?Pulmonary:  ?   Effort: Pulmonary effort is normal. No respiratory distress.  ?   Breath sounds: Normal breath sounds. No wheezing or rales.  ?Musculoskeletal:  ?   Cervical back: Normal range of motion and neck supple.  ?Skin: ?   General: Skin is warm and dry.  ?   Capillary Refill: Capillary refill takes less than 2 seconds.  ?Neurological:  ?   General: No focal deficit present.  ?   Mental Status: She is  alert and oriented to person, place, and time. Mental status is at baseline.  ?Psychiatric:     ?   Mood and Affect: Mood normal.     ?   Behavior: Behavior normal.     ?   Thought Content: Thought content normal.     ?   Judgment: Judgment normal.  ? ? ?Results for orders placed or performed in visit on 04/16/21  ?NuSwab Vaginitis Plus (VG+)  ?Result Value Ref Range  ? Atopobium vaginae Low - 0 Score  ? BVAB 2 Low - 0 Score  ? Megasphaera 1 Low - 0 Score  ? Candida albicans, NAA Negative Negative  ? Candida glabrata, NAA Negative Negative  ? Trich vag by NAA Negative Negative  ? Chlamydia trachomatis, NAA Negative Negative  ? Neisseria gonorrhoeae, NAA Negative Negative  ? ?   ?Assessment & Plan:  ? ?Problem List Items Addressed This Visit   ? ?  ? Endocrine  ? Acquired hypothyroidism  ?  Chronic.  Controlled.  Continue with current medication regimen on Levothyroxine 67m daily.  Labs ordered today.  Return to clinic in 6 months for reevaluation.  Call sooner if concerns arise.  ? ?  ?  ? Relevant Orders  ? TSH  ? T4, free  ?  ? Other  ? Fibromyalgia - Primary  ?  Chronic. Ongoing. Uses ibuprofen for pain control.  Discussed decreasing usage due to risk of kidney injury.  CMP ordered at visit today. Follow up in 6 months for reevaluation. ?  ?  ? Relevant Medications  ? DULoxetine (CYMBALTA) 20 MG capsule  ? ibuprofen (ADVIL) 800 MG tablet  ? Other Relevant Orders  ? Comp Met (CMET)  ? CBC w/Diff  ? Anxiety  ?  Chronic. Not well controlled.  Will start Cymbalta 228mdaily. Side effects and benefits of medication discussed with patient during visit. Follow up in 1 month for reevaluation.  ?  ?  ? Relevant Medications  ? DULoxetine (CYMBALTA) 20 MG capsule  ? ?Other Visit Diagnoses   ? ? Acute pain of left knee      ? Xray ordered of knee.  Will make recommendations based on xray results.    ? Relevant Orders  ? DG Knee Complete 4 Views Left  ? ?  ?  ? ?Follow up plan: ?Return in about 1 month (around 09/13/2021) for  Depression/Anxiety FU. ? ? ? ? ? ?

## 2021-08-13 NOTE — Assessment & Plan Note (Signed)
Chronic. Not well controlled.  Will start Cymbalta '20mg'$  daily. Side effects and benefits of medication discussed with patient during visit. Follow up in 1 month for reevaluation.  ?

## 2021-08-14 LAB — COMPREHENSIVE METABOLIC PANEL
ALT: 15 IU/L (ref 0–32)
AST: 20 IU/L (ref 0–40)
Albumin/Globulin Ratio: 1.6 (ref 1.2–2.2)
Albumin: 4.1 g/dL (ref 3.8–4.8)
Alkaline Phosphatase: 80 IU/L (ref 44–121)
BUN/Creatinine Ratio: 13 (ref 9–23)
BUN: 8 mg/dL (ref 6–20)
Bilirubin Total: 0.3 mg/dL (ref 0.0–1.2)
CO2: 14 mmol/L — ABNORMAL LOW (ref 20–29)
Calcium: 8.8 mg/dL (ref 8.7–10.2)
Chloride: 107 mmol/L — ABNORMAL HIGH (ref 96–106)
Creatinine, Ser: 0.61 mg/dL (ref 0.57–1.00)
Globulin, Total: 2.5 g/dL (ref 1.5–4.5)
Glucose: 110 mg/dL — ABNORMAL HIGH (ref 70–99)
Potassium: 4.7 mmol/L (ref 3.5–5.2)
Sodium: 139 mmol/L (ref 134–144)
Total Protein: 6.6 g/dL (ref 6.0–8.5)
eGFR: 118 mL/min/{1.73_m2} (ref >59)

## 2021-08-14 LAB — CBC WITH DIFFERENTIAL/PLATELET
Basophils Absolute: 0.1 10*3/uL (ref 0.0–0.2)
Basos: 1 %
EOS (ABSOLUTE): 0.3 10*3/uL (ref 0.0–0.4)
Eos: 3 %
Hematocrit: 43.2 % (ref 34.0–46.6)
Hemoglobin: 14.6 g/dL (ref 11.1–15.9)
Immature Grans (Abs): 0 10*3/uL (ref 0.0–0.1)
Immature Granulocytes: 0 %
Lymphocytes Absolute: 2.4 10*3/uL (ref 0.7–3.1)
Lymphs: 29 %
MCH: 33.6 pg — ABNORMAL HIGH (ref 26.6–33.0)
MCHC: 33.8 g/dL (ref 31.5–35.7)
MCV: 100 fL — ABNORMAL HIGH (ref 79–97)
Monocytes Absolute: 0.6 10*3/uL (ref 0.1–0.9)
Monocytes: 7 %
Neutrophils Absolute: 4.9 10*3/uL (ref 1.4–7.0)
Neutrophils: 60 %
Platelets: 263 10*3/uL (ref 150–450)
RBC: 4.34 x10E6/uL (ref 3.77–5.28)
RDW: 12.7 % (ref 11.7–15.4)
WBC: 8.2 10*3/uL (ref 3.4–10.8)

## 2021-08-14 LAB — TSH: TSH: 3.12 u[IU]/mL (ref 0.450–4.500)

## 2021-08-14 LAB — T4, FREE: Free T4: 1.17 ng/dL (ref 0.82–1.77)

## 2021-08-14 MED ORDER — LEVOTHYROXINE SODIUM 25 MCG PO TABS: ORAL_TABLET | ORAL | 1 refills | Status: DC

## 2021-08-14 NOTE — Addendum Note (Signed)
Addended by: Jon Billings on: 08/14/2021 11:53 AM ? ? Modules accepted: Orders ? ?

## 2021-08-14 NOTE — Progress Notes (Signed)
Hi! It was good to see you yesterday.  Your lab work looks good.  No concerns at this time.  Your thyroid medication is the correct dose.  I sent in a new refill.  Your kidneys are doing okay for now.  We will continue to monitor them.  Make sure to use the ibuprofen as little as possible.

## 2021-08-20 ENCOUNTER — Ambulatory Visit
Admission: RE | Admit: 2021-08-20 | Discharge: 2021-08-20 | Disposition: A | Discharge status: Home / Self Care | Payer: Medicaid Other | Attending: Nurse Practitioner | Admitting: Nurse Practitioner

## 2021-08-20 ENCOUNTER — Other Ambulatory Visit: Payer: Self-pay

## 2021-08-20 ENCOUNTER — Ambulatory Visit
Admission: RE | Admit: 2021-08-20 | Discharge: 2021-08-20 | Disposition: A | Discharge status: Home / Self Care | Payer: Medicaid Other | Source: Ambulatory Visit | Attending: Nurse Practitioner | Admitting: Nurse Practitioner

## 2021-08-20 DIAGNOSIS — M25562 Pain in left knee: Secondary | ICD-10-CM | POA: Insufficient documentation

## 2021-08-21 ENCOUNTER — Telehealth: Payer: Self-pay | Admitting: Nurse Practitioner

## 2021-08-21 DIAGNOSIS — M25562 Pain in left knee: Secondary | ICD-10-CM

## 2021-08-21 NOTE — Telephone Encounter (Signed)
Pt states Yes, please refer her to orthopedics for her knee. ?She did not know how to reply in [place], so she asked me to relay the message.  Thanks. ?

## 2021-08-21 NOTE — Telephone Encounter (Signed)
Patient is aware 

## 2021-08-21 NOTE — Progress Notes (Signed)
I received your knee xray.  It does not show any abnormality.  If you are still experiencing pain I recommend you see Orthopedics.  I can place a referral if you are okay with it.

## 2021-08-27 DIAGNOSIS — M1712 Unilateral primary osteoarthritis, left knee: Secondary | ICD-10-CM | POA: Diagnosis not present

## 2021-08-27 DIAGNOSIS — M1711 Unilateral primary osteoarthritis, right knee: Secondary | ICD-10-CM | POA: Diagnosis not present

## 2021-09-06 ENCOUNTER — Ambulatory Visit: Payer: Self-pay | Admitting: *Deleted

## 2021-09-06 NOTE — Telephone Encounter (Signed)
?  Chief Complaint: pain all over  ?Symptoms: headache, pain all over . Requesting what other medications can be taken for pain with meloxicam  ?Frequency: today  ?Pertinent Negatives: Patient denies na ?Disposition: '[]'$ ED /'[]'$ Urgent Care (no appt availability in office) / '[]'$ Appointment(In office/virtual)/ '[]'$  [place] [facility]/ '[]'$ Home Care/ '[]'$ Refused Recommended Disposition /'[]'$ [place] [facility]/ '[x]'$  Follow-up with PCP ?Additional Notes: ? ?Requesting a call back ? ? ? Reason for Disposition ? [1] Caller has medicine question about med NOT prescribed by PCP AND [2] triager unable to answer question (e.g., compatibility with other med, storage) ? ?Answer Assessment - Initial Assessment Questions ?1. NAME of MEDICATION: "What medicine are you calling about?" ?    meloxicam ?2. QUESTION: "What is your question?" (e.g., double dose of medicine, side effect) ?    What other medication for pain can be taken with meloxicam? ?3. PRESCRIBING HCP: "Who prescribed it?" Reason: if prescribed by specialist, call should be referred to that group. ?    ortho ?4. SYMPTOMS: "Do you have any symptoms?" ?    Pain all over and headache  ?5. SEVERITY: If symptoms are present, ask "Are they mild, moderate or severe?" ?    Hurting all over ?6. PREGNANCY:  "Is there any chance that you are pregnant?" "When was your last menstrual period?" ?    na ? ?Protocols used: Medication Question Call-A-AH ? ?

## 2021-09-07 ENCOUNTER — Ambulatory Visit: Payer: Self-pay

## 2021-09-07 ENCOUNTER — Telehealth: Payer: Medicaid Other | Admitting: Family Medicine

## 2021-09-07 DIAGNOSIS — M797 Fibromyalgia: Secondary | ICD-10-CM | POA: Diagnosis not present

## 2021-09-07 DIAGNOSIS — R52 Pain, unspecified: Secondary | ICD-10-CM

## 2021-09-07 MED ORDER — PREDNISONE 10 MG (21) PO TBPK: ORAL_TABLET | ORAL | 0 refills | Status: DC

## 2021-09-07 NOTE — Telephone Encounter (Signed)
Pt returned call from yesterday regarding medications that she could take. The patient states that Tylenol does nothing for her and needs to be seen for evaluation. . Pt does not have transportation. Pt does not want to wait until Monday. Made virtual appointment for this afternoon.  ?

## 2021-09-07 NOTE — Telephone Encounter (Signed)
Routing to provider. Patient wanting to know what she can take with meloxicam.  ?

## 2021-09-07 NOTE — Patient Instructions (Signed)

## 2021-09-07 NOTE — Progress Notes (Signed)
?Virtual Visit Consent  ? ?Megan Bennett, you are scheduled for a virtual visit with a [place] provider today.   ?  ?Just as with appointments in the office, your consent must be obtained to participate.  Your consent will be active for this visit and any virtual visit you may have with one of our providers in the next 365 days.   ?  ?If you have a MyChart account, a copy of this consent can be sent to you electronically.  All virtual visits are billed to your insurance company just like a traditional visit in the office.   ? ?As this is a virtual visit, video technology does not allow for your provider to perform a traditional examination.  This may limit your provider's ability to fully assess your condition.  If your provider identifies any concerns that need to be evaluated in person or the need to arrange testing (such as labs, EKG, etc.), we will make arrangements to do so.   ?  ?Although advances in technology are sophisticated, we cannot ensure that it will always work on either your end or our end.  If the connection with a video visit is poor, the visit may have to be switched to a telephone visit.  With either a video or telephone visit, we are not always able to ensure that we have a secure connection.    ? ?I need to obtain your verbal consent now.   Are you willing to proceed with your visit today?  ?  ?Megan Bennett has provided verbal consent on 09/07/2021 for a virtual visit (video or telephone). ?  ?Dellia Nims, FNP  ? ?Date: 09/07/2021 3:51 PM ? ? ?Virtual Visit via Video Note  ? ?IDellia Nims, connected with  Megan Bennett  ([id number], May 24, 1986) on 09/07/21 at  3:45 PM EDT by a video-enabled telemedicine application and verified that I am speaking with the correct person using two identifiers. ? ?Location: ?Patient: Virtual Visit Location Patient: Home ?Provider: Virtual Visit Location Provider: Home Office ?  ?I discussed the limitations of evaluation and  management by telemedicine and the availability of in person appointments. The patient expressed understanding and agreed to proceed.   ? ?History of Present Illness: ?Megan Bennett is a 36 y.o. who identifies as a female who was assigned female at birth, and is being seen today for pain with fibromyalgia. She says her doctor started her on meloxicam and she takes cymbalta but that she is hurting all over today. Denie upper resp symptoms. She had prednisone in the past for bronchitis but not for this. Denies fever. . ? ?HPI: HPI  ?Problems:  ?Patient Active Problem List  ? Diagnosis Date Noted  ? Acquired hypothyroidism 08/13/2021  ? Anxiety 08/13/2021  ? Fibromyalgia 03/19/2021  ? Pelvic pain in female 01/12/2021  ? Supervision of high risk pregnancy in second trimester 01/12/2021  ? Current smoker 08/29/2020  ? Bilateral hand pain 07/09/2019  ? Bilateral wrist pain 07/09/2019  ? Postoperative complication 62/22/9798  ? S/P laparoscopic surgery 08/04/2018  ? Small bowel perforation, intraoperative 08/04/2018  ? Encounter to determine fetal viability of pregnancy, fetus 1 11/17/2017  ?  ?Allergies:  ?Allergies  ?Allergen Reactions  ? Amoxicillin Rash  ?  Did it involve swelling of the face/tongue/throat, SOB, or low BP? No ?Did it involve sudden or severe rash/hives, skin peeling, or any reaction on the inside of your mouth or nose? No ?Did you need to seek medical  attention at a hospital or doctor's office? No ?When did it last happen?      14 + years ?If all above answers are ?NO?, may proceed with cephalosporin use., ?  ? Hydrocodone Nausea And Vomiting  ? Wellbutrin [Bupropion] Other (See Comments)  ?  shakey  ? ?Medications:  ?Current Outpatient Medications:  ?  diphenhydrAMINE (BENADRYL) 25 mg capsule, Take 25 mg by mouth as needed for allergies., Disp: , Rfl:  ?  DULoxetine (CYMBALTA) 20 MG capsule, Take 1 capsule (20 mg total) by mouth daily., Disp: 30 capsule, Rfl: 0 ?  ibuprofen (ADVIL) 800 MG  tablet, Take 1 tablet (800 mg total) by mouth every 8 (eight) hours as needed., Disp: 90 tablet, Rfl: 1 ?  levothyroxine (SYNTHROID) 25 MCG tablet, TAKE 1 TABLET(25 MCG) BY MOUTH DAILY, Disp: 90 tablet, Rfl: 1 ?  pantoprazole (PROTONIX) 40 MG tablet, TAKE 1 TABLET(40 MG) BY MOUTH DAILY, Disp: 30 tablet, Rfl: 2 ?  PROAIR HFA 108 (90 Base) MCG/ACT inhaler, Inhale 2 puffs into the lungs every 6 (six) hours as needed for wheezing or shortness of breath., Disp: 18 g, Rfl: 1 ?  STIOLTO RESPIMAT 2.5-2.5 MCG/ACT AERS, INHALE 2 PUFFS BY MOUTH DAILY, Disp: 4 g, Rfl: 1 ?  SUMAtriptan (IMITREX) 100 MG tablet, Take 100 mg by mouth daily as needed., Disp: , Rfl:  ? ?Observations/Objective: ?Patient is well-developed, well-nourished in no acute distress.  ?Resting reporting pain at home.  ?Head is normocephalic, atraumatic.  ?No labored breathing.  ?Speech is clear and coherent with logical content.  ?Patient is alert and oriented at baseline.  ? ? ?Assessment and Plan: ?1. Fibromyalgia ? ?2. Generalized pain ? ?Follow up with pcp if sx persist. Hold meloxicam and other nsaids while on prednisone. Resume meloxicam after prednisone. Tylenol as directed prn.  ? ?Follow Up Instructions: ?I discussed the assessment and treatment plan with the patient. The patient was provided an opportunity to ask questions and all were answered. The patient agreed with the plan and demonstrated an understanding of the instructions.  A copy of instructions were sent to the patient via MyChart unless otherwise noted below.  ? ? ? ?The patient was advised to call back or seek an in-person evaluation if the symptoms worsen or if the condition fails to improve as anticipated. ? ?Time:  ?I spent 8 minutes with the patient via telehealth technology discussing the above problems/concerns.   ? ?Dellia Nims, FNP  ?

## 2021-09-08 DIAGNOSIS — Z419 Encounter for procedure for purposes other than remedying health state, unspecified: Secondary | ICD-10-CM | POA: Diagnosis not present

## 2021-09-09 ENCOUNTER — Other Ambulatory Visit: Payer: Self-pay | Admitting: Nurse Practitioner

## 2021-09-10 ENCOUNTER — Ambulatory Visit: Payer: Medicaid Other | Admitting: Nurse Practitioner

## 2021-09-10 ENCOUNTER — Encounter: Payer: Self-pay | Admitting: Nurse Practitioner

## 2021-09-10 VITALS — BP 143/97 | HR 74 | Temp 98.5°F | Wt 271.2 lb

## 2021-09-10 DIAGNOSIS — K5900 Constipation, unspecified: Secondary | ICD-10-CM | POA: Diagnosis not present

## 2021-09-10 DIAGNOSIS — F419 Anxiety disorder, unspecified: Secondary | ICD-10-CM | POA: Diagnosis not present

## 2021-09-10 MED ORDER — POLYETHYLENE GLYCOL 3350 17 G PO PACK: 17.0000 g | PACK | Freq: Every day | ORAL | 1 refills | Status: DC

## 2021-09-10 MED ORDER — DULOXETINE HCL 20 MG PO CPEP: 40.0000 mg | ORAL_CAPSULE | Freq: Every day | ORAL | 0 refills | Status: DC

## 2021-09-10 NOTE — Progress Notes (Signed)
? ?BP (!) 143/97   Pulse 74   Temp 98.5 ?F (36.9 ?C) (Oral)   Wt 271 lb 3.2 oz (123 kg)   SpO2 98%   BMI 41.24 kg/m?   ? ?Subjective:  ? ? Patient ID: [id number] Megan Bennett, female    DOB: [birth year], 36 y.o.   MRN: [id number] ? ?HPI: ?Megan Bennett is a 36 y.o. female ? ?Chief Complaint  ?Patient presents with  ? Anxiety  ?  Follow up . Pt states she feels as if cymbalta is not helping.   ? Depression  ?   ?  ? ?ANXIETY ?Patient states she hasn't noticed much of a difference with the Cymbalta.  She said it hasn't helped with the pain either.   ? ? ?  09/10/2021  ?  2:29 PM 08/13/2021  ?  1:53 PM 06/08/2021  ? 11:38 AM 04/16/2021  ?  2:39 PM  ?GAD 7 : Generalized Anxiety Score  ?Nervous, Anxious, on Edge '2 2 3 1  ' ?Control/stop worrying '1 2 2 ' 0  ?Worry too much - different things '1 2 2 ' 0  ?Trouble relaxing '1 2 2 1  ' ?Restless '1 1 2 1  ' ?Easily annoyed or irritable '1 1 3 1  ' ?Afraid - awful might happen 0 1 0 0  ?Total GAD 7 Score '7 11 14 4  ' ?Anxiety Difficulty Somewhat difficult Somewhat difficult Very difficult Somewhat difficult  ? ? ?Patient states she has been having difficulty pooping lately.  She has had GI surgery in the past which makes it very painful when she doesn't poop regularly. ? ? ?Relevant past medical, surgical, family and social history reviewed and updated as indicated. Interim medical history since our last visit reviewed. ?Allergies and medications reviewed and updated. ? ?Review of Systems  ?Constitutional:  Negative for fatigue and unexpected weight change.  ?Cardiovascular:  Negative for palpitations and leg swelling.  ?Gastrointestinal:  Negative for constipation and diarrhea.  ?Endocrine: Negative for cold intolerance and heat intolerance.  ?Psychiatric/Behavioral:  Positive for dysphoric mood. The patient is nervous/anxious.   ? ?Per HPI unless specifically indicated above ? ?   ?Objective:  ?  ?BP (!) 143/97   Pulse 74   Temp 98.5 ?F (36.9 ?C) (Oral)   Wt 271 lb 3.2 oz (123 kg)    SpO2 98%   BMI 41.24 kg/m?   ?Wt Readings from Last 3 Encounters:  ?09/10/21 271 lb 3.2 oz (123 kg)  ?08/13/21 274 lb 6.4 oz (124.5 kg)  ?06/08/21 275 lb (124.7 kg)  ?  ?Physical Exam ?Vitals and nursing note reviewed.  ?Constitutional:   ?   General: She is not in acute distress. ?   Appearance: Normal appearance. She is obese. She is not ill-appearing, toxic-appearing or diaphoretic.  ?HENT:  ?   Head: Normocephalic.  ?   Right Ear: External ear normal.  ?   Left Ear: External ear normal.  ?   Nose: Nose normal.  ?   Mouth/Throat:  ?   Mouth: Mucous membranes are moist.  ?   Pharynx: Oropharynx is clear.  ?Eyes:  ?   General:     ?   Right eye: No discharge.     ?   Left eye: No discharge.  ?   Extraocular Movements: Extraocular movements intact.  ?   Conjunctiva/sclera: Conjunctivae normal.  ?   Pupils: Pupils are equal, round, and reactive to light.  ?Cardiovascular:  ?   Rate and Rhythm: Normal rate and  regular rhythm.  ?   Heart sounds: No murmur heard. ?Pulmonary:  ?   Effort: Pulmonary effort is normal. No respiratory distress.  ?   Breath sounds: Normal breath sounds. No wheezing or rales.  ?Musculoskeletal:  ?   Cervical back: Normal range of motion and neck supple.  ?Skin: ?   General: Skin is warm and dry.  ?   Capillary Refill: Capillary refill takes less than 2 seconds.  ?Neurological:  ?   General: No focal deficit present.  ?   Mental Status: She is alert and oriented to person, place, and time. Mental status is at baseline.  ?Psychiatric:     ?   Mood and Affect: Mood normal.     ?   Behavior: Behavior normal.     ?   Thought Content: Thought content normal.     ?   Judgment: Judgment normal.  ? ? ?Results for orders placed or performed in visit on 08/13/21  ?TSH  ?Result Value Ref Range  ? TSH 3.120 0.450 - 4.500 uIU/mL  ?T4, free  ?Result Value Ref Range  ? Free T4 1.17 0.82 - 1.77 ng/dL  ?Comp Met (CMET)  ?Result Value Ref Range  ? Glucose 110 (H) 70 - 99 mg/dL  ? BUN 8 6 - 20 mg/dL  ?  Creatinine, Ser 0.61 0.57 - 1.00 mg/dL  ? eGFR 118 >59 mL/min/1.73  ? BUN/Creatinine Ratio 13 9 - 23  ? Sodium 139 134 - 144 mmol/L  ? Potassium 4.7 3.5 - 5.2 mmol/L  ? Chloride 107 (H) 96 - 106 mmol/L  ? CO2 14 (L) 20 - 29 mmol/L  ? Calcium 8.8 8.7 - 10.2 mg/dL  ? Total Protein 6.6 6.0 - 8.5 g/dL  ? Albumin 4.1 3.8 - 4.8 g/dL  ? Globulin, Total 2.5 1.5 - 4.5 g/dL  ? Albumin/Globulin Ratio 1.6 1.2 - 2.2  ? Bilirubin Total 0.3 0.0 - 1.2 mg/dL  ? Alkaline Phosphatase 80 44 - 121 IU/L  ? AST 20 0 - 40 IU/L  ? ALT 15 0 - 32 IU/L  ?CBC w/Diff  ?Result Value Ref Range  ? WBC 8.2 3.4 - 10.8 x10E3/uL  ? RBC 4.34 3.77 - 5.28 x10E6/uL  ? Hemoglobin 14.6 11.1 - 15.9 g/dL  ? Hematocrit 43.2 34.0 - 46.6 %  ? MCV 100 (H) 79 - 97 fL  ? MCH 33.6 (H) 26.6 - 33.0 pg  ? MCHC 33.8 31.5 - 35.7 g/dL  ? RDW 12.7 11.7 - 15.4 %  ? Platelets 263 150 - 450 x10E3/uL  ? Neutrophils 60 Not Estab. %  ? Lymphs 29 Not Estab. %  ? Monocytes 7 Not Estab. %  ? Eos 3 Not Estab. %  ? Basos 1 Not Estab. %  ? Neutrophils Absolute 4.9 1.4 - 7.0 x10E3/uL  ? Lymphocytes Absolute 2.4 0.7 - 3.1 x10E3/uL  ? Monocytes Absolute 0.6 0.1 - 0.9 x10E3/uL  ? EOS (ABSOLUTE) 0.3 0.0 - 0.4 x10E3/uL  ? Basophils Absolute 0.1 0.0 - 0.2 x10E3/uL  ? Immature Granulocytes 0 Not Estab. %  ? Immature Grans (Abs) 0.0 0.0 - 0.1 x10E3/uL  ? ?   ?Assessment & Plan:  ? ?Problem List Items Addressed This Visit   ? ?  ? Other  ? Anxiety - Primary  ?  Chronic. Not well controlled.  Will increase Cymbalta to 54m daily.  Will follow up in 1 month for reevaluation.  Call sooner if concerns arise.  ?  ?  ?  Relevant Medications  ? DULoxetine (CYMBALTA) 20 MG capsule  ? ?Other Visit Diagnoses   ? ? Constipation, unspecified constipation type      ? Will give miralax for PRN constipation.  Discussed how to properly use medication.  FU if symptoms worsen or fail to improve.  ? ?  ?  ? ?Follow up plan: ?Return in about 1 month (around 10/10/2021) for Depression/Anxiety FU. ? ? ? ? ? ?

## 2021-09-10 NOTE — Assessment & Plan Note (Signed)
Chronic. Not well controlled.  Will increase Cymbalta to '40mg'$  daily.  Will follow up in 1 month for reevaluation.  Call sooner if concerns arise.  ?

## 2021-09-11 NOTE — Telephone Encounter (Signed)
Requested medication (s) are due for refill today: yes ? ?Requested medication (s) are on the active medication list: yes ? ?Last refill:  06/06/21 4g with 1 RF ? ?Future visit scheduled: 10/15/21 ? ?Notes to clinic:  There is not a protocol to follow for this med, please assess. ? ? ?  ? ?Requested Prescriptions  ?Pending Prescriptions Disp Refills  ? STIOLTO RESPIMAT 2.5-2.5 MCG/ACT AERS [Pharmacy Med Name: STIOLTO RESPIMAT 2.5/2.5MCG INH 4GM] 4 g 1  ?  Sig: INHALE 2 PUFFS BY MOUTH DAILY  ?  ? Off-Protocol Failed - 09/09/2021  9:48 AM  ?  ?  Failed - Medication not assigned to a protocol, review manually.  ?  ?  Passed - Valid encounter within last 12 months  ?  Recent Outpatient Visits   ? ?      ? Yesterday Anxiety  ? [place], NP  ? 4 weeks ago Fibromyalgia  ? [place], NP  ? 3 months ago COVID-19  ? [place], NP  ? 5 months ago Menstrual cramp  ? [place], NP  ? 5 months ago Fibromyalgia  ? [facility] Jon Billings, NP  ? ?  ?  ?Future Appointments   ? ?        ? In 1 month Jon Billings, NP [facility], [facility]  ? In 2 months Ralene Bathe, MD [place]  ? ?  ? ?  ?  ?  ?Refused Prescriptions Disp Refills  ? DULoxetine (CYMBALTA) 20 MG capsule [Pharmacy Med Name: DULOXETINE DR 20MG CAPSULES] 30 capsule 0  ?  Sig: TAKE 1 CAPSULE(20 MG) BY MOUTH DAILY  ?  ? Psychiatry: Antidepressants - SNRI - duloxetine Passed - 09/09/2021  9:48 AM  ?  ?  Passed - Cr in normal range and within 360 days  ?  Creatinine, Ser  ?Date Value Ref Range Status  ?08/13/2021 0.61 0.57 - 1.00 mg/dL Final  ? ?Creatinine, Urine  ?Date Value Ref Range Status  ?06/07/2018 16 mg/dL Final  ?  ?  ?  ?  Passed - eGFR is 30 or above and within 360 days  ?  GFR calc Af Amer  ?Date Value Ref Range Status  ?08/10/2018 >60 >60 mL/min Final  ? ?GFR calc non Af Amer  ?Date Value  Ref Range Status  ?08/10/2018 >60 >60 mL/min Final  ? ?eGFR  ?Date Value Ref Range Status  ?08/13/2021 118 >59 mL/min/1.73 Final  ?  ?  ?  ?  Passed - Completed PHQ-2 or PHQ-9 in the last 360 days  ?  ?  Passed - Last BP in normal range  ?  BP Readings from Last 1 Encounters:  ?09/10/21 (!) 143/97  ?  ?  ?  ?  Passed - Valid encounter within last 6 months  ?  Recent Outpatient Visits   ? ?      ? Yesterday Anxiety  ? [place], NP  ? 4 weeks ago Fibromyalgia  ? [place], NP  ? 3 months ago COVID-19  ? [place], NP  ? 5 months ago Menstrual cramp  ? [place], NP  ? 5 months ago Fibromyalgia  ? [facility] Jon Billings, NP  ? ?  ?  ?Future Appointments   ? ?        ?  In 1 month Jon Billings, NP [facility], [facility]  ? In 2 months Ralene Bathe, MD [place]  ? ?  ? ?  ?  ?  ? levothyroxine (SYNTHROID) 25 MCG tablet [Pharmacy Med Name: LEVOTHYROXINE 0.025MG (25MCG) TAB] 30 tablet   ?  Sig: TAKE 1 TABLET(25 MCG) BY MOUTH DAILY  ?  ? Endocrinology:  Hypothyroid Agents Passed - 09/09/2021  9:48 AM  ?  ?  Passed - TSH in normal range and within 360 days  ?  TSH  ?Date Value Ref Range Status  ?08/13/2021 3.120 0.450 - 4.500 uIU/mL Final  ?  ?  ?  ?  Passed - Valid encounter within last 12 months  ?  Recent Outpatient Visits   ? ?      ? Yesterday Anxiety  ? [place], NP  ? 4 weeks ago Fibromyalgia  ? [place], NP  ? 3 months ago COVID-19  ? [place], NP  ? 5 months ago Menstrual cramp  ? [place], NP  ? 5 months ago Fibromyalgia  ? [facility] Jon Billings, NP  ? ?  ?  ?Future Appointments   ? ?        ? In 1 month Jon Billings, NP [facility], [facility]  ? In 2 months Ralene Bathe, MD [place]  ? ?  ? ?  ?  ?  ? ? ?

## 2021-09-11 NOTE — Telephone Encounter (Signed)
Cymbalta duplicate, Synthroid has newer rx. ?Requested Prescriptions  ?Pending Prescriptions Disp Refills  ?? DULoxetine (CYMBALTA) 20 MG capsule [Pharmacy Med Name: DULOXETINE DR 20MG CAPSULES] 30 capsule 0  ?  Sig: TAKE 1 CAPSULE(20 MG) BY MOUTH DAILY  ?  ? Psychiatry: Antidepressants - SNRI - duloxetine Passed - 09/09/2021  9:48 AM  ?  ?  Passed - Cr in normal range and within 360 days  ?  Creatinine, Ser  ?Date Value Ref Range Status  ?08/13/2021 0.61 0.57 - 1.00 mg/dL Final  ? ?Creatinine, Urine  ?Date Value Ref Range Status  ?06/07/2018 16 mg/dL Final  ?   ?  ?  Passed - eGFR is 30 or above and within 360 days  ?  GFR calc Af Amer  ?Date Value Ref Range Status  ?08/10/2018 >60 >60 mL/min Final  ? ?GFR calc non Af Amer  ?Date Value Ref Range Status  ?08/10/2018 >60 >60 mL/min Final  ? ?eGFR  ?Date Value Ref Range Status  ?08/13/2021 118 >59 mL/min/1.73 Final  ?   ?  ?  Passed - Completed PHQ-2 or PHQ-9 in the last 360 days  ?  ?  Passed - Last BP in normal range  ?  BP Readings from Last 1 Encounters:  ?09/10/21 (!) 143/97  ?   ?  ?  Passed - Valid encounter within last 6 months  ?  Recent Outpatient Visits   ?      ? Yesterday Anxiety  ? [place], NP  ? 4 weeks ago Fibromyalgia  ? [place], NP  ? 3 months ago COVID-19  ? [place], NP  ? 5 months ago Menstrual cramp  ? [place], NP  ? 5 months ago Fibromyalgia  ? [facility] Jon Billings, NP  ?  ?  ?Future Appointments   ?        ? In 1 month Jon Billings, NP [facility], [facility]  ? In 2 months Ralene Bathe, MD [place]  ?  ? ?  ?  ?  ?? levothyroxine (SYNTHROID) 25 MCG tablet [Pharmacy Med Name: LEVOTHYROXINE 0.025MG (25MCG) TAB] 30 tablet   ?  Sig: TAKE 1 TABLET(25 MCG) BY MOUTH DAILY  ?  ? Endocrinology:  Hypothyroid Agents Passed - 09/09/2021  9:48 AM  ?  ?  Passed - TSH in  normal range and within 360 days  ?  TSH  ?Date Value Ref Range Status  ?08/13/2021 3.120 0.450 - 4.500 uIU/mL Final  ?   ?  ?  Passed - Valid encounter within last 12 months  ?  Recent Outpatient Visits   ?      ? Yesterday Anxiety  ? [place], NP  ? 4 weeks ago Fibromyalgia  ? [place], NP  ? 3 months ago COVID-19  ? [place], NP  ? 5 months ago Menstrual cramp  ? [place], NP  ? 5 months ago Fibromyalgia  ? [facility] Jon Billings, NP  ?  ?  ?Future Appointments   ?        ? In 1 month Jon Billings, NP [facility], [facility]  ? In 2 months Ralene Bathe, MD [place]  ?  ? ?  ?  ?  ?? STIOLTO RESPIMAT 2.5-2.5 MCG/ACT AERS [facility]  Med Name: STIOLTO RESPIMAT 2.5/2.5MCG INH 4GM] 4 g 1  ?  Sig: INHALE 2 PUFFS BY MOUTH DAILY  ?  ? Off-Protocol Failed - 09/09/2021  9:48 AM  ?  ?  Failed - Medication not assigned to a protocol, review manually.  ?  ?  Passed - Valid encounter within last 12 months  ?  Recent Outpatient Visits   ?      ? Yesterday Anxiety  ? [place], NP  ? 4 weeks ago Fibromyalgia  ? [place], NP  ? 3 months ago COVID-19  ? [place], NP  ? 5 months ago Menstrual cramp  ? [place], NP  ? 5 months ago Fibromyalgia  ? [facility] Jon Billings, NP  ?  ?  ?Future Appointments   ?        ? In 1 month Jon Billings, NP [facility], [facility]  ? In 2 months Ralene Bathe, MD [place]  ?  ? ?  ?  ?  ? ? ?

## 2021-10-03 DIAGNOSIS — M25562 Pain in left knee: Secondary | ICD-10-CM | POA: Diagnosis not present

## 2021-10-08 ENCOUNTER — Other Ambulatory Visit: Payer: Self-pay | Admitting: Nurse Practitioner

## 2021-10-08 DIAGNOSIS — Z419 Encounter for procedure for purposes other than remedying health state, unspecified: Secondary | ICD-10-CM | POA: Diagnosis not present

## 2021-10-09 ENCOUNTER — Encounter: Payer: Self-pay | Admitting: Physician Assistant

## 2021-10-09 ENCOUNTER — Ambulatory Visit: Payer: Self-pay

## 2021-10-09 ENCOUNTER — Telehealth: Payer: Medicaid Other | Admitting: Physician Assistant

## 2021-10-09 DIAGNOSIS — L0390 Cellulitis, unspecified: Secondary | ICD-10-CM

## 2021-10-09 MED ORDER — DOXYCYCLINE HYCLATE 100 MG PO CAPS: 100.0000 mg | ORAL_CAPSULE | Freq: Two times a day (BID) | ORAL | 0 refills | Status: AC

## 2021-10-09 MED ORDER — ONDANSETRON HCL 4 MG PO TABS: 4.0000 mg | ORAL_TABLET | Freq: Three times a day (TID) | ORAL | 0 refills | Status: DC | PRN

## 2021-10-09 NOTE — Telephone Encounter (Signed)
Requested Prescriptions  ?Pending Prescriptions Disp Refills  ?? DULoxetine (CYMBALTA) 20 MG capsule [Pharmacy Med Name: DULOXETINE DR 20MG CAPSULES] 60 capsule 1  ?  Sig: TAKE 2 CAPSULES(40 MG) BY MOUTH DAILY  ?  ? Psychiatry: Antidepressants - SNRI - duloxetine Failed - 10/08/2021  3:15 AM  ?  ?  Failed - Last BP in normal range  ?  BP Readings from Last 1 Encounters:  ?09/10/21 (!) 143/97  ?   ?  ?  Passed - Cr in normal range and within 360 days  ?  Creatinine, Ser  ?Date Value Ref Range Status  ?08/13/2021 0.61 0.57 - 1.00 mg/dL Final  ? ?Creatinine, Urine  ?Date Value Ref Range Status  ?06/07/2018 16 mg/dL Final  ?   ?  ?  Passed - eGFR is 30 or above and within 360 days  ?  GFR calc Af Amer  ?Date Value Ref Range Status  ?08/10/2018 >60 >60 mL/min Final  ? ?GFR calc non Af Amer  ?Date Value Ref Range Status  ?08/10/2018 >60 >60 mL/min Final  ? ?eGFR  ?Date Value Ref Range Status  ?08/13/2021 118 >59 mL/min/1.73 Final  ?   ?  ?  Passed - Completed PHQ-2 or PHQ-9 in the last 360 days  ?  ?  Passed - Valid encounter within last 6 months  ?  Recent Outpatient Visits   ?      ? 4 weeks ago Anxiety  ? [place], NP  ? 1 month ago Fibromyalgia  ? [place], NP  ? 4 months ago COVID-19  ? [place], NP  ? 6 months ago Menstrual cramp  ? [place], NP  ? 6 months ago Fibromyalgia  ? [facility] Jon Billings, NP  ?  ?  ?Future Appointments   ?        ? In 6 days Jon Billings, NP [facility], [place]  ? In 1 month Ralene Bathe, MD [place]  ?  ? ?  ?  ?  ? ? ?

## 2021-10-09 NOTE — Patient Instructions (Signed)
?  [name], thank you for joining [facility], PA-C for today's virtual visit.  While this provider is not your primary care provider (PCP), if your PCP is located in our provider database this encounter information will be shared with them immediately following your visit. ? ?Consent: ?(Patient) CRISTIN PENAFLOR provided verbal consent for this virtual visit at the beginning of the encounter. ? ?Current Medications: ? ?Current Outpatient Medications:  ?  diphenhydrAMINE (BENADRYL) 25 mg capsule, Take 25 mg by mouth as needed for allergies., Disp: , Rfl:  ?  DULoxetine (CYMBALTA) 20 MG capsule, TAKE 2 CAPSULES(40 MG) BY MOUTH DAILY, Disp: 60 capsule, Rfl: 1 ?  ibuprofen (ADVIL) 800 MG tablet, Take 1 tablet (800 mg total) by mouth every 8 (eight) hours as needed. (Patient not taking: Reported on 09/10/2021), Disp: 90 tablet, Rfl: 1 ?  levothyroxine (SYNTHROID) 25 MCG tablet, TAKE 1 TABLET(25 MCG) BY MOUTH DAILY, Disp: 90 tablet, Rfl: 1 ?  meloxicam (MOBIC) 15 MG tablet, meloxicam 15 mg tablet  Take 1 tablet every day by oral route., Disp: , Rfl:  ?  pantoprazole (PROTONIX) 40 MG tablet, TAKE 1 TABLET(40 MG) BY MOUTH DAILY (Patient not taking: Reported on 09/10/2021), Disp: 30 tablet, Rfl: 2 ?  polyethylene glycol (MIRALAX) 17 g packet, Take 17 g by mouth daily., Disp: 14 each, Rfl: 1 ?  predniSONE (STERAPRED UNI-PAK 21 TAB) 10 MG (21) TBPK tablet, 10 mg- 6 day dose pack as directed, Disp: 1 each, Rfl: 0 ?  PROAIR HFA 108 (90 Base) MCG/ACT inhaler, Inhale 2 puffs into the lungs every 6 (six) hours as needed for wheezing or shortness of breath., Disp: 18 g, Rfl: 1 ?  STIOLTO RESPIMAT 2.5-2.5 MCG/ACT AERS, INHALE 2 PUFFS BY MOUTH DAILY, Disp: 4 g, Rfl: 1 ?  SUMAtriptan (IMITREX) 100 MG tablet, Take 100 mg by mouth daily as needed., Disp: , Rfl:   ? ?Medications ordered in this encounter:  ?No orders of the defined types were placed in this encounter. ?  ? ?*If you need refills on other medications  prior to your next appointment, please contact your pharmacy* ? ?Follow-Up: ?Call back or seek an in-person evaluation if the symptoms worsen or if the condition fails to improve as anticipated. ? ?Other Instructions ?You were given a prescription for antibiotics. Please take the antibiotic prescription fully.  ? ?Follow up with your regular doctor in 1 week for reassessment and seek care sooner if your symptoms worsen or fail to improve. ? ? ?If you have been instructed to have an in-person evaluation today at a local Urgent Care facility, please use the link below. It will take you to a list of all of our available [facility], including address, phone number and hours of operation. Please do not delay care.  ?[facility] ? ?If you or a family member do not have a primary care provider, use the link below to schedule a visit and establish care. When you choose a [facility] primary care physician or advanced practice provider, you gain a long-term partner in health. ?Find a Primary Care Provider ? ?Learn more about [facility]'s in-office and virtual care options: ?[facility] ? ?

## 2021-10-09 NOTE — Progress Notes (Signed)
Ms. maty, zeisler are scheduled for a virtual visit with your provider today.   ? ?Just as we do with appointments in the office, we must obtain your consent to participate.  Your consent will be active for this visit and any virtual visit you may have with one of our providers in the next 365 days.   ? ?If you have a MyChart account, I can also send a copy of this consent to you electronically.  All virtual visits are billed to your insurance company just like a traditional visit in the office.  As this is a virtual visit, video technology does not allow for your provider to perform a traditional examination.  This may limit your provider's ability to fully assess your condition.  If your provider identifies any concerns that need to be evaluated in person or the need to arrange testing such as labs, EKG, etc, we will make arrangements to do so.   ? ?Although advances in technology are sophisticated, we cannot ensure that it will always work on either your end or our end.  If the connection with a video visit is poor, we may have to switch to a telephone visit.  With either a video or telephone visit, we are not always able to ensure that we have a secure connection.   I need to obtain your verbal consent now.   Are you willing to proceed with your visit today?  ? ?LOVETTA CONDIE has provided verbal consent on 10/09/2021 for a virtual visit (video or telephone). ? ? ?[place], PA-C ?10/09/2021  4:55 PM ? ? ?Date:  10/09/2021  ? ?ID:  CABRINA SHIROMA, DOB [birth year], MRN [id number] ? ?Patient Location: Home ?Provider Location: Home Office ? ? ?Participants: Patient and Provider for Visit and Wrap up ? ?Method of visit: Video  ?Location of Patient: Home ?Location of Provider: Home Office ?Consent was obtain for visit over the video. ?Services rendered by provider: Visit was performed via video ? ?A video enabled telemedicine application was used and I verified that I am speaking with the correct person  using two identifiers. ? ?PCP:  Jon Billings, NP  ? ?Chief Complaint:  skin problem ? ?History of Present Illness:   ? ?Megan Bennett is a 36 y.o. female with history as stated below. ?Presents video telehealth for an acute care visit ? ?She reports she was at her friends house this weekend and thinks that she was bit by a spider. She has two areas that are very red and tender. She also has surrounding erythema and swelling to her forearm. She denies fevers. She has tried benadryl and ibuprofen without relief.  ? ?Past Medical, Surgical, Social History, Allergies, and Medications have been Reviewed. ? ?Past Medical History:  ?Diagnosis Date  ? ADD (attention deficit disorder)   ? Arthritis   ? Fibromyalgia   ? GERD (gastroesophageal reflux disease)   ? Headache   ? MIGRAINES  ? ? ?Current Meds  ?Medication Sig  ? doxycycline (VIBRAMYCIN) 100 MG capsule Take 1 capsule (100 mg total) by mouth 2 (two) times daily for 7 days.  ? ondansetron (ZOFRAN) 4 MG tablet Take 1 tablet (4 mg total) by mouth every 8 (eight) hours as needed for nausea or vomiting.  ?  ? ?Allergies:   Amoxicillin, Hydrocodone, and Wellbutrin [bupropion]  ? ?ROS ?See HPI for history of present illness. ? ?Physical Exam ?Constitutional:   ?   Appearance: Normal appearance.  ?Skin: ?   Comments:  Two 1cm areas of erythema to the left forearm with associated surrounding erythema and swelling noted.   ?Neurological:  ?   Mental Status: She is alert.  ? ?         MDM: pt with cellulitis. Denies fevers or systemic sxs. Advised warm compresses and to mark a border around the area. Rx for doxycyline given. She has f/u with pcp next week. Advised her to be seen sooner if sxs worsen ? ?There are no diagnoses linked to this encounter. ? ? ?Time:   ?Today, I have spent 10 minutes with the patient with telehealth technology discussing the above problems, reviewing the chart, previous notes, medications and orders.  ? ? ?Tests Ordered: ?No orders of  the defined types were placed in this encounter. ? ? ?Medication Changes: ?Meds ordered this encounter  ?Medications  ? doxycycline (VIBRAMYCIN) 100 MG capsule  ?  Sig: Take 1 capsule (100 mg total) by mouth 2 (two) times daily for 7 days.  ?  Dispense:  14 capsule  ?  Refill:  0  ?  Order Specific Question:   Supervising Provider  ?  Answer:   Noemi Chapel [[id number]]  ? ondansetron (ZOFRAN) 4 MG tablet  ?  Sig: Take 1 tablet (4 mg total) by mouth every 8 (eight) hours as needed for nausea or vomiting.  ?  Dispense:  20 tablet  ?  Refill:  0  ?  Order Specific Question:   Supervising Provider  ?  Answer:   Noemi Chapel [[id number]]  ? ? ? ?Disposition:  Follow up  ?Signed, ?[place], PA-C  ?10/09/2021 4:55 PM    ? ? ?

## 2021-10-09 NOTE — Telephone Encounter (Signed)
?  Chief Complaint: Allergic reaction - bite ?Symptoms: Red swollen bug bite ?Frequency: since Sunday ?Pertinent Negatives: Patient denies fever ?Disposition: '[]'$ ED /'[]'$ Urgent Care (no appt availability in office) / '[]'$ Appointment(In office/virtual)/ '[x]'$  [facility]/ '[]'$ Home Care/ '[]'$ Refused Recommended Disposition /'[]'$ [facility]/ '[]'$  Follow-up with PCP ?Additional Notes: Pt was bitten by something on Sunday. Pt does not remember being bitten, but did have 2 itchy spots on her arm. Spots are now painful, warm red lumps  about 1/4 around and 1/4 apart.  The lumps may be fluid filled. Virtual appointment made - pt cannot come to an in person appt. ? ? ?Reason for Disposition ? [1] Red or very tender (to touch) area AND [2] started over 24 hours after the bite ? ?Answer Assessment - Initial Assessment Questions ?1. TYPE of SPIDER: "What type of spider was it?"  (e.g., name, unknown, or brief description) ?    unknown ?2. LOCATION: "Where is the bite located?"  ?    Left - forearm ?3. PAIN: "Is there any pain?" If Yes, ask: "How bad is it?"  (Scale 1-10; or mild, moderate, severe) ?    7/10 ?4. SWELLING: "How big is the swelling?" (Inches, cm or compare to coins)  ?    Smaller than a dime ?5. ONSET: "When did the bite occur?" (Minutes or hours ago)  ?    Sunday ?6. TETANUS: "When was the last tetanus booster?"  ?    unknown ?7. OTHER SYMPTOMS: "Do you have any other symptoms?"  (e.g., muscle cramps, abdominal pain, change in urine color) ?    Abdominal pain diarrhea ? ?Protocols used: Spider Bite - [place]-A-AH ? ?

## 2021-10-15 ENCOUNTER — Ambulatory Visit: Payer: Medicaid Other | Admitting: Nurse Practitioner

## 2021-10-15 ENCOUNTER — Encounter: Payer: Self-pay | Admitting: Nurse Practitioner

## 2021-10-15 VITALS — BP 121/82 | HR 92 | Temp 99.2°F | Wt 275.0 lb

## 2021-10-15 DIAGNOSIS — F419 Anxiety disorder, unspecified: Secondary | ICD-10-CM | POA: Diagnosis not present

## 2021-10-15 MED ORDER — DULOXETINE HCL 40 MG PO CPEP: 40.0000 mg | ORAL_CAPSULE | Freq: Every day | ORAL | 1 refills | Status: DC

## 2021-10-15 MED ORDER — POLYETHYLENE GLYCOL 3350 17 G PO PACK: 17.0000 g | PACK | Freq: Every day | ORAL | 1 refills | Status: DC

## 2021-10-15 NOTE — Assessment & Plan Note (Signed)
Chronic.  Controlled.  Continue with current medication regimen of Duloxetine '40mg'$  daily. Refill sent today.  Return to clinic in 3 months for reevaluation.  Call sooner if concerns arise.  ? ?

## 2021-10-15 NOTE — Progress Notes (Signed)
? ?BP 121/82   Pulse 92   Temp 99.2 ?F (37.3 ?C) (Oral)   Wt 275 lb (124.7 kg)   SpO2 99%   BMI 41.81 kg/m?   ? ?Subjective:  ? ? Patient ID: Megan Bennett, female    DOB: 12-22-1985, 36 y.o.   MRN: [id number] ? ?HPI: ?Megan Bennett is a 36 y.o. female ? ?Chief Complaint  ?Patient presents with  ? Anxiety  ? Depression  ?  1 month follow up   ? ?ANXIETY ?Patient states her anxiety is improved. Patient states she feels like the Cymbalta is helping her mood.  It is also helping her pain.  She does have to take some ibuprofen sometimes but not  often as she was before.  Denies SI. ? ? ?  10/15/2021  ?  2:43 PM 09/10/2021  ?  2:29 PM 08/13/2021  ?  1:53 PM 06/08/2021  ? 11:38 AM  ?GAD 7 : Generalized Anxiety Score  ?Nervous, Anxious, on Edge '1 2 2 3  ' ?Control/stop worrying 0 '1 2 2  ' ?Worry too much - different things '1 1 2 2  ' ?Trouble relaxing 0 '1 2 2  ' ?Restless 0 '1 1 2  ' ?Easily annoyed or irritable 0 '1 1 3  ' ?Afraid - awful might happen 0 0 1 0  ?Total GAD 7 Score '2 7 11 14  ' ?Anxiety Difficulty Somewhat difficult Somewhat difficult Somewhat difficult Very difficult  ? ?[employer] Office Visit from 10/15/2021 in [place]  ?PHQ-9 Total Score 2  ? ?  ? ? ? ? ? ?Patient states she has been having difficulty pooping lately.  She has had GI surgery in the past which makes it very painful when she doesn't poop regularly. ? ? ?Relevant past medical, surgical, family and social history reviewed and updated as indicated. Interim medical history since our last visit reviewed. ?Allergies and medications reviewed and updated. ? ?Review of Systems  ?Constitutional:  Negative for fatigue and unexpected weight change.  ?Cardiovascular:  Negative for palpitations and leg swelling.  ?Gastrointestinal:  Negative for constipation and diarrhea.  ?Endocrine: Negative for cold intolerance and heat intolerance.  ?Psychiatric/Behavioral:  Positive for dysphoric mood. The patient is nervous/anxious.   ? ?Per HPI  unless specifically indicated above ? ?   ?Objective:  ?  ?BP 121/82   Pulse 92   Temp 99.2 ?F (37.3 ?C) (Oral)   Wt 275 lb (124.7 kg)   SpO2 99%   BMI 41.81 kg/m?   ?Wt Readings from Last 3 Encounters:  ?10/15/21 275 lb (124.7 kg)  ?09/10/21 271 lb 3.2 oz (123 kg)  ?08/13/21 274 lb 6.4 oz (124.5 kg)  ?  ?Physical Exam ?Vitals and nursing note reviewed.  ?Constitutional:   ?   General: She is not in acute distress. ?   Appearance: Normal appearance. She is obese. She is not ill-appearing, toxic-appearing or diaphoretic.  ?HENT:  ?   Head: Normocephalic.  ?   Right Ear: External ear normal.  ?   Left Ear: External ear normal.  ?   Nose: Nose normal.  ?   Mouth/Throat:  ?   Mouth: Mucous membranes are moist.  ?   Pharynx: Oropharynx is clear.  ?Eyes:  ?   General:     ?   Right eye: No discharge.     ?   Left eye: No discharge.  ?   Extraocular Movements: Extraocular movements intact.  ?   Conjunctiva/sclera: Conjunctivae normal.  ?  Pupils: Pupils are equal, round, and reactive to light.  ?Cardiovascular:  ?   Rate and Rhythm: Normal rate and regular rhythm.  ?   Heart sounds: No murmur heard. ?Pulmonary:  ?   Effort: Pulmonary effort is normal. No respiratory distress.  ?   Breath sounds: Normal breath sounds. No wheezing or rales.  ?Musculoskeletal:  ?   Cervical back: Normal range of motion and neck supple.  ?Skin: ?   General: Skin is warm and dry.  ?   Capillary Refill: Capillary refill takes less than 2 seconds.  ?Neurological:  ?   General: No focal deficit present.  ?   Mental Status: She is alert and oriented to person, place, and time. Mental status is at baseline.  ?Psychiatric:     ?   Mood and Affect: Mood normal.     ?   Behavior: Behavior normal.     ?   Thought Content: Thought content normal.     ?   Judgment: Judgment normal.  ? ? ?Results for orders placed or performed in visit on 08/13/21  ?TSH  ?Result Value Ref Range  ? TSH 3.120 0.450 - 4.500 uIU/mL  ?T4, free  ?Result Value Ref Range  ?  Free T4 1.17 0.82 - 1.77 ng/dL  ?Comp Met (CMET)  ?Result Value Ref Range  ? Glucose 110 (H) 70 - 99 mg/dL  ? BUN 8 6 - 20 mg/dL  ? Creatinine, Ser 0.61 0.57 - 1.00 mg/dL  ? eGFR 118 >59 mL/min/1.73  ? BUN/Creatinine Ratio 13 9 - 23  ? Sodium 139 134 - 144 mmol/L  ? Potassium 4.7 3.5 - 5.2 mmol/L  ? Chloride 107 (H) 96 - 106 mmol/L  ? CO2 14 (L) 20 - 29 mmol/L  ? Calcium 8.8 8.7 - 10.2 mg/dL  ? Total Protein 6.6 6.0 - 8.5 g/dL  ? Albumin 4.1 3.8 - 4.8 g/dL  ? Globulin, Total 2.5 1.5 - 4.5 g/dL  ? Albumin/Globulin Ratio 1.6 1.2 - 2.2  ? Bilirubin Total 0.3 0.0 - 1.2 mg/dL  ? Alkaline Phosphatase 80 44 - 121 IU/L  ? AST 20 0 - 40 IU/L  ? ALT 15 0 - 32 IU/L  ?CBC w/Diff  ?Result Value Ref Range  ? WBC 8.2 3.4 - 10.8 x10E3/uL  ? RBC 4.34 3.77 - 5.28 x10E6/uL  ? Hemoglobin 14.6 11.1 - 15.9 g/dL  ? Hematocrit 43.2 34.0 - 46.6 %  ? MCV 100 (H) 79 - 97 fL  ? MCH 33.6 (H) 26.6 - 33.0 pg  ? MCHC 33.8 31.5 - 35.7 g/dL  ? RDW 12.7 11.7 - 15.4 %  ? Platelets 263 150 - 450 x10E3/uL  ? Neutrophils 60 Not Estab. %  ? Lymphs 29 Not Estab. %  ? Monocytes 7 Not Estab. %  ? Eos 3 Not Estab. %  ? Basos 1 Not Estab. %  ? Neutrophils Absolute 4.9 1.4 - 7.0 x10E3/uL  ? Lymphocytes Absolute 2.4 0.7 - 3.1 x10E3/uL  ? Monocytes Absolute 0.6 0.1 - 0.9 x10E3/uL  ? EOS (ABSOLUTE) 0.3 0.0 - 0.4 x10E3/uL  ? Basophils Absolute 0.1 0.0 - 0.2 x10E3/uL  ? Immature Granulocytes 0 Not Estab. %  ? Immature Grans (Abs) 0.0 0.0 - 0.1 x10E3/uL  ? ?   ?Assessment & Plan:  ? ?Problem List Items Addressed This Visit   ? ?  ? Other  ? Anxiety - Primary  ?  Chronic.  Controlled.  Continue with current medication regimen  of Duloxetine 2m daily. Refill sent today.  Return to clinic in 3 months for reevaluation.  Call sooner if concerns arise.  ? ? ?  ?  ? Relevant Medications  ? DULoxetine HCl 40 MG CPEP  ?  ? ?Follow up plan: ?Return in about 3 months (around 01/15/2022) for Depression/Anxiety FU. ? ? ? ? ? ?

## 2021-10-16 ENCOUNTER — Telehealth: Payer: Self-pay

## 2021-10-16 NOTE — Telephone Encounter (Signed)
PA initiated and approved for Duloxetine  ?Key: [id number] ? ?Marland Kitchen Patient has been called and made aware. Pt voiced understanding.  ?

## 2021-10-17 DIAGNOSIS — M25562 Pain in left knee: Secondary | ICD-10-CM | POA: Diagnosis not present

## 2021-10-18 ENCOUNTER — Other Ambulatory Visit: Payer: Self-pay | Admitting: Nurse Practitioner

## 2021-10-18 NOTE — Telephone Encounter (Signed)
Medication Refill - Medication:  ? ?ibuprofen (ADVIL) 800 MG tablet  ? ?Has the patient contacted their pharmacy? Yes.   ?(Agent: If no, request that the patient contact the pharmacy for the refill. If patient does not wish to contact the pharmacy document the reason why and proceed with request.) ?(Agent: If yes, when and what did the pharmacy advise?) ? ?Preferred Pharmacy (with phone number or street name):  ? ?[facility] [place], [place] [facility] [address]  ?[place], [place] [address]  ?Phone:  [phone]  Fax:  [phone]  ? ? ?Has the patient been seen for an appointment in the last year OR does the patient have an upcoming appointment? Yes.   ? ?Agent: Please be advised that RX refills may take up to 3 business days. We ask that you follow-up with your pharmacy. ? ?

## 2021-10-19 ENCOUNTER — Ambulatory Visit: Payer: Medicaid Other | Admitting: Nurse Practitioner

## 2021-10-19 MED ORDER — IBUPROFEN 800 MG PO TABS: 800.0000 mg | ORAL_TABLET | Freq: Three times a day (TID) | ORAL | 1 refills | Status: DC | PRN

## 2021-10-19 NOTE — Telephone Encounter (Signed)
Requested medication (s) are due for refill today - yes-if to continue ? ?Requested medication (s) are on the active medication list -yes ? ?Future visit scheduled -today ? ?Last refill: 08/13/21 #90 1RF ? ?Notes to clinic: Appointment today- patient advised to use as little as possible- sent for review of request-appointment today ? ?Requested Prescriptions  ?Pending Prescriptions Disp Refills  ? ibuprofen (ADVIL) 800 MG tablet 90 tablet 1  ?  Sig: Take 1 tablet (800 mg total) by mouth every 8 (eight) hours as needed.  ?  ? Analgesics:  NSAIDS Failed - 10/19/2021 10:18 AM  ?  ?  Failed - Manual Review: Labs are only required if the patient has taken medication for more than 8 weeks.  ?  ?  Passed - Cr in normal range and within 360 days  ?  Creatinine, Ser  ?Date Value Ref Range Status  ?08/13/2021 0.61 0.57 - 1.00 mg/dL Final  ? ?Creatinine, Urine  ?Date Value Ref Range Status  ?06/07/2018 16 mg/dL Final  ?   ?  ?  Passed - HGB in normal range and within 360 days  ?  Hemoglobin  ?Date Value Ref Range Status  ?08/13/2021 14.6 11.1 - 15.9 g/dL Final  ?   ?  ?  Passed - PLT in normal range and within 360 days  ?  Platelets  ?Date Value Ref Range Status  ?08/13/2021 263 150 - 450 x10E3/uL Final  ?   ?  ?  Passed - HCT in normal range and within 360 days  ?  Hematocrit  ?Date Value Ref Range Status  ?08/13/2021 43.2 34.0 - 46.6 % Final  ?   ?  ?  Passed - eGFR is 30 or above and within 360 days  ?  GFR calc Af Amer  ?Date Value Ref Range Status  ?08/10/2018 >60 >60 mL/min Final  ? ?GFR calc non Af Amer  ?Date Value Ref Range Status  ?08/10/2018 >60 >60 mL/min Final  ? ?eGFR  ?Date Value Ref Range Status  ?08/13/2021 118 >59 mL/min/1.73 Final  ?   ?  ?  Passed - Patient is not pregnant  ?  ?  Passed - Valid encounter within last 12 months  ?  Recent Outpatient Visits   ? ?      ? 4 days ago Anxiety  ? [place], NP  ? 1 month ago Anxiety  ? [place], NP  ?  2 months ago Fibromyalgia  ? [place], NP  ? 4 months ago COVID-19  ? [place], NP  ? 6 months ago Menstrual cramp  ? [facility] Jon Billings, NP  ? ?  ?  ?Future Appointments   ? ?        ? Today Jon Billings, NP [facility], [place]  ? In 1 month Ralene Bathe, MD [place]  ? In 2 months Jon Billings, NP [facility]  ? ?  ? ? ?  ?  ?  ? ? ? ?Requested Prescriptions  ?Pending Prescriptions Disp Refills  ? ibuprofen (ADVIL) 800 MG tablet 90 tablet 1  ?  Sig: Take 1 tablet (800 mg total) by mouth every 8 (eight) hours as needed.  ?  ? Analgesics:  NSAIDS Failed - 10/19/2021 10:18 AM  ?  ?  Failed - Manual Review: Labs are only required if the patient has taken  medication for more than 8 weeks.  ?  ?  Passed - Cr in normal range and within 360 days  ?  Creatinine, Ser  ?Date Value Ref Range Status  ?08/13/2021 0.61 0.57 - 1.00 mg/dL Final  ? ?Creatinine, Urine  ?Date Value Ref Range Status  ?06/07/2018 16 mg/dL Final  ?   ?  ?  Passed - HGB in normal range and within 360 days  ?  Hemoglobin  ?Date Value Ref Range Status  ?08/13/2021 14.6 11.1 - 15.9 g/dL Final  ?   ?  ?  Passed - PLT in normal range and within 360 days  ?  Platelets  ?Date Value Ref Range Status  ?08/13/2021 263 150 - 450 x10E3/uL Final  ?   ?  ?  Passed - HCT in normal range and within 360 days  ?  Hematocrit  ?Date Value Ref Range Status  ?08/13/2021 43.2 34.0 - 46.6 % Final  ?   ?  ?  Passed - eGFR is 30 or above and within 360 days  ?  GFR calc Af Amer  ?Date Value Ref Range Status  ?08/10/2018 >60 >60 mL/min Final  ? ?GFR calc non Af Amer  ?Date Value Ref Range Status  ?08/10/2018 >60 >60 mL/min Final  ? ?eGFR  ?Date Value Ref Range Status  ?08/13/2021 118 >59 mL/min/1.73 Final  ?   ?  ?  Passed - Patient is not pregnant  ?  ?  Passed - Valid encounter within last 12 months  ?  Recent Outpatient Visits   ? ?       ? 4 days ago Anxiety  ? [place], NP  ? 1 month ago Anxiety  ? [place], NP  ? 2 months ago Fibromyalgia  ? [place], NP  ? 4 months ago COVID-19  ? [place], NP  ? 6 months ago Menstrual cramp  ? [facility] Jon Billings, NP  ? ?  ?  ?Future Appointments   ? ?        ? Today Jon Billings, NP [facility], [place]  ? In 1 month Ralene Bathe, MD [place]  ? In 2 months Jon Billings, NP [facility], [facility]  ? ?  ? ? ?  ?  ?  ? ? ? ?

## 2021-11-05 ENCOUNTER — Other Ambulatory Visit: Payer: Self-pay | Admitting: Nurse Practitioner

## 2021-11-07 NOTE — Telephone Encounter (Signed)
Requested medication (s) are due for refill today: for review  Requested medication (s) are on the active medication list: yes    Last refill: 09/11/21  4G  1 refill  Future visit scheduled Ye  01/14/22  Notes to clinic: Off protocol, please review, thank you.  Requested Prescriptions  Pending Prescriptions Disp Refills   STIOLTO RESPIMAT 2.5-2.5 MCG/ACT AERS [Pharmacy Med Name: STIOLTO RESPIMAT 2.5/2.5MCG INH 4GM] 4 g 1    Sig: INHALE 2 PUFFS BY MOUTH DAILY     Off-Protocol Failed - 11/05/2021  9:16 AM      Failed - Medication not assigned to a protocol, review manually.      Passed - Valid encounter within last 12 months    Recent Outpatient Visits           3 weeks ago [place] Jon Billings, NP   1 month ago [place], NP   2 months ago [place], NP   5 months ago COVID-19   [place], NP   7 months ago Menstrual cramp   [facility] Jon Billings, NP       Future Appointments             In 2 weeks Ralene Bathe, MD [place]   In 2 months Jon Billings, NP [facility], [place]

## 2021-11-08 DIAGNOSIS — Z419 Encounter for procedure for purposes other than remedying health state, unspecified: Secondary | ICD-10-CM | POA: Diagnosis not present

## 2021-11-21 ENCOUNTER — Ambulatory Visit: Payer: Medicaid Other | Admitting: Dermatology

## 2021-11-21 DIAGNOSIS — B070 Plantar wart: Secondary | ICD-10-CM

## 2021-11-21 DIAGNOSIS — L732 Hidradenitis suppurativa: Secondary | ICD-10-CM | POA: Diagnosis not present

## 2021-11-21 MED ORDER — HELIOCARE 240 MG PO CAPS: 1.0000 | ORAL_CAPSULE | ORAL | 6 refills | Status: DC

## 2021-11-21 MED ORDER — MINOCYCLINE HCL 50 MG PO CAPS: 50.0000 mg | ORAL_CAPSULE | Freq: Every day | ORAL | 0 refills | Status: DC

## 2021-11-21 MED ORDER — HELIOCARE 240 MG PO CAPS: 1.0000 | ORAL_CAPSULE | Freq: Two times a day (BID) | ORAL | 6 refills | Status: DC

## 2021-11-21 NOTE — Patient Instructions (Addendum)
Instructions for After In-Office Application of Cantharidin  1. This is a strong medicine; please follow ALL instructions.  2. Gently wash off with soap and water in four hours or sooner s directed by your physician.  3. **WARNING** this medicine can cause severe blistering, blood blisters, infection, and/or scarring if it is not washed off as directed.  4. Your progress will be rechecked in 1-2 months; call sooner if there are any questions or problems.  Isotretinoin   Due to recent changes in healthcare laws, you may see results of your pathology and/or laboratory studies on MyChart before the doctors have had a chance to review them. We understand that in some cases there may be results that are confusing or concerning to you. Please understand that not all results are received at the same time and often the doctors may need to interpret multiple results in order to provide you with the best plan of care or course of treatment. Therefore, we ask that you please give [place] 2 business days to thoroughly review all your results before contacting the office for clarification. Should we see a critical lab result, you will be contacted sooner.   If You Need Anything After Your Visit  If you have any questions or concerns for your doctor, please call our main line at [phone] and press option 4 to reach your doctor's medical assistant. If no one answers, please leave a voicemail as directed and we will return your call as soon as possible. Messages left after 4 pm will be answered the following business day.   You may also send [place] a message via [place]. We typically respond to MyChart messages within 1-2 business days.  For prescription refills, please ask your pharmacy to contact our office. Our fax number is [phone].  If you have an urgent issue when the clinic is closed that cannot wait until the next business day, you can page your doctor at the number below.    Please note that while  we do our best to be available for urgent issues outside of office hours, we are not available 24/7.   If you have an urgent issue and are unable to reach [place], you may choose to seek medical care at your doctor's office, retail clinic, urgent care center, or emergency room.  If you have a medical emergency, please immediately call 911 or go to the emergency department.  Pager Numbers  - Dr. Nehemiah Massed: [phone]  - Dr. Laurence Ferrari: [phone]  - Dr. Nicole Kindred: [phone]  In the event of inclement weather, please call our main line at [phone] for an update on the status of any delays or closures.  Dermatology Medication Tips: Please keep the boxes that topical medications come in in order to help keep track of the instructions about where and how to use these. Pharmacies typically print the medication instructions only on the boxes and not directly on the medication tubes.   If your medication is too expensive, please contact our office at [phone] option 4 or send [place] a message through [place].   We are unable to tell what your co-pay for medications will be in advance as this is different depending on your insurance coverage. However, we may be able to find a substitute medication at lower cost or fill out paperwork to get insurance to cover a needed medication.   If a prior authorization is required to get your medication covered by your insurance company, please allow [place] 1-2 business days to complete  this process.  Drug prices often vary depending on where the prescription is filled and some pharmacies may offer cheaper prices.  The website www.goodrx.com contains coupons for medications through different pharmacies. The prices here do not account for what the cost may be with help from insurance (it may be cheaper with your insurance), but the website can give you the price if you did not use any insurance.  - You can print the associated coupon and take it with your prescription  to the pharmacy.  - You may also stop by our office during regular business hours and pick up a GoodRx coupon card.  - If you need your prescription sent electronically to a different pharmacy, notify our office through [facility] or by phone at [phone] option 4.     Si Usted Necesita Algo Despus de Su Visita  Tambin puede enviarnos un mensaje a travs de Pharmacist, community. Por lo general respondemos a los mensajes de MyChart en el transcurso de 1 a 2 das hbiles.  Para renovar recetas, por favor pida a su farmacia que se ponga en contacto con nuestra oficina. Harland Dingwall de fax es [place] [phone].  Si tiene un asunto urgente cuando la clnica est cerrada y que no puede esperar hasta el siguiente da hbil, puede llamar/localizar a su doctor(a) al nmero que aparece a continuacin.   Por favor, tenga en cuenta que aunque hacemos todo lo posible para estar disponibles para asuntos urgentes fuera del horario de [place], no estamos disponibles las 24 horas del da, los 7 das de la [place].   Si tiene un problema urgente y no puede comunicarse con nosotros, puede optar por buscar atencin mdica  en el consultorio de su doctor(a), en una clnica privada, en un centro de atencin urgente o en una sala de emergencias.  Si tiene Engineering geologist, por favor llame inmediatamente al 911 o vaya a la sala de emergencias.  Nmeros de bper  - Dr. Nehemiah Massed: [phone]  - Dra. Moye: [phone]  - Dra. Nicole Kindred: [phone]  En caso de inclemencias del [place], por favor llame a Johnsie Kindred principal al [phone] para una actualizacin sobre el [place] de cualquier retraso o cierre.  Consejos para la medicacin en dermatologa: Por favor, guarde las cajas en las que vienen los medicamentos de uso tpico para ayudarle a seguir las instrucciones sobre dnde y cmo usarlos. Las farmacias generalmente imprimen las instrucciones del medicamento slo en las cajas y no directamente en  los tubos del [place].   Si su medicamento es muy caro, por favor, pngase en contacto con Zigmund Daniel llamando al [phone] y presione la opcin 4 o envenos un mensaje a travs de Pharmacist, community.   No podemos decirle cul ser su copago por los medicamentos por adelantado ya que esto es diferente dependiendo de la cobertura de su seguro. Sin embargo, es posible que podamos encontrar un medicamento sustituto a Electrical engineer un formulario para que el seguro cubra el medicamento que se considera necesario.   Si se requiere una autorizacin previa para que su compaa de seguros [place] su medicamento, por favor permtanos de 1 a 2 das hbiles para completar este proceso.  Los precios de los medicamentos varan con frecuencia dependiendo del Environmental consultant de dnde se surte la receta y alguna farmacias pueden ofrecer precios ms baratos.  El sitio web www.goodrx.com tiene cupones para medicamentos de Airline pilot. Los precios aqu no tienen en cuenta lo que podra costar con la ayuda del seguro (  puede ser ms barato con su seguro), pero el sitio web puede darle el precio si no Field seismologist.  - Puede imprimir el cupn correspondiente y llevarlo con su receta a la farmacia.  - Tambin puede pasar por nuestra oficina durante el horario de atencin regular y Charity fundraiser una tarjeta de cupones de GoodRx.  - Si necesita que su receta se enve electrnicamente a una farmacia diferente, informe a nuestra oficina a travs de MyChart de [place] o por telfono llamando al [phone] y presione la opcin 4.

## 2021-11-21 NOTE — Progress Notes (Signed)
New Patient Visit  Subjective  Megan Bennett is a 36 y.o. female who presents for the following: Callouses, callus on foot (L foot, > 36yr, and cyst (Axill, inframammary, inner thighs, painful, drain sometimes, pt with hx of tubal ligation).  New patient referral from KJon Billings NP.  The following portions of the chart were reviewed this encounter and updated as appropriate:   Tobacco  Allergies  Meds  Problems  Med Hx  Surg Hx  Fam Hx     Review of Systems:  No other skin or systemic complaints except as noted in HPI or Assessment and Plan.  Objective  Well appearing patient in no apparent distress; mood and affect are within normal limits.  A focused examination was performed including L foot, axilla, inframammary. Relevant physical exam findings are noted in the Assessment and Plan.  bil axilla, inframammary,groin Pink pap L axillary, 3 of R axillary, 1 active area R inframammary, multiple comedones inframammary, paps of groin Scarring of axillary and groin areas.  L ant lat sole 0.3cm verrucous pap  Assessment & Plan  Hidradenitis suppurativa bil axilla, inframammary,groin  Hidradenitis Suppurativa is a chronic; persistent; non-curable, but treatable condition due to abnormal inflamed sweat glands in the body folds (axilla, inframammary, groin, medial thighs), causing recurrent painful draining cysts and scarring. It can be associated with severe scarring acne and cysts; also abscesses and scarring of scalp. The goal is control and prevention of flares, as it is not curable. Scars are permanent and can be thickened. Treatment may include daily use of topical medication and oral antibiotics.  Oral isotretinoin may also be helpful.  For more severe cases, Humira (a biologic injection) may be prescribed to decrease the inflammatory process and prevent flares.  When indicated, inflamed cysts may also be treated surgically.  Recommend Isotretinoin Pt has had a  tubal ligation 07/2018 IPLEDGE consents signed  Urine pregnancy test performed in office today and was negative.  Patient demonstrates comprehension and confirms she will not get pregnant. Lot [id number] 12/14/2022  [facility] [place] [id number] forms of BC will be Tubal ligation, female latex condoms  Start Minocycline '50mg'$  1 po qd with dinner #30, 0rf, pt will take while waiting to start Isotretinoin Discussed Heliocare, Start Helicare 1  Reviewed potential side effects of isotretinoin including xerosis, cheilitis, hepatitis, hyperlipidemia, and severe birth defects if taken by a pregnant woman. Reviewed reports of suicidal ideation in those with a history of depression while taking isotretinoin and reports of diagnosis of inflammatory bowl disease while taking isotretinoin as well as the lack of evidence for a causal relationship between isotretinoin, depression and IBD. Patient advised to reach out with any questions or concerns. Patient advised not to share pills or donate blood while on treatment or for one month after completing treatment.   minocycline (MINOCIN) 50 MG capsule - bil axilla, inframammary,groin Take 1 capsule (50 mg total) by mouth daily. 1 po with dinner meal  Polypodium Leucotomos (HELIOCARE) 240 MG CAPS - bil axilla, inframammary,groin Take 1 capsule (240 mg total) by mouth as directed. 1-2 po qd as directed  Plantar wart L ant lat sole  Discussed viral etiology and risk of spread.  Discussed multiple treatments may be required to clear warts.  Discussed possible post-treatment dyspigmentation and risk of recurrence.  Destruction of lesion - L ant lat sole Complexity: simple   Destruction method: cryotherapy   Informed consent: discussed and consent obtained   Timeout:  patient name, date of birth, surgical site, and procedure verified Outcome: patient tolerated procedure well with no complications   Post-procedure details: wound care  instructions given   Additional details:  Patient advised to set alarm to remind them to wash off with soap and water at the directed time.  Destruction of lesion - L ant lat sole  Destruction method: chemical removal   Informed consent: discussed and consent obtained   Timeout:  patient name, date of birth, surgical site, and procedure verified Chemical destruction method: cantharidin   Chemical destruction method comment:  Cantharidin plus Application time:  4 hours Procedure instructions: patient instructed to wash and dry area   Outcome: patient tolerated procedure well with no complications   Post-procedure details: wound care instructions given   Additional details:  Cantharidin Plus is a blistering agent that comes from a beetle.  It needs to be washed off in about 4-6 hours after application.  Although it is painless when applied in office, it may cause symptoms of mild pain and burning several hours later.  Treated areas will swell and turn red, and blisters may form.  Vaseline and a bandaid may be applied until wound has healed.  Once healed, the skin may remain temporarily discolored.  It can take weeks to months for pigmentation to return to normal.  Advised to wash off with soap and water in 4-6 hours or sooner if it becomes tender before then.   Return in about 1 month (around 12/21/2021) for Isotretinoin start.  I, Othelia Pulling, RMA, am acting as scribe for Sarina Ser, MD . Documentation: I have reviewed the above documentation for accuracy and completeness, and I agree with the above.  Sarina Ser, MD

## 2021-11-23 ENCOUNTER — Encounter: Payer: Self-pay | Admitting: Dermatology

## 2021-12-08 DIAGNOSIS — Z419 Encounter for procedure for purposes other than remedying health state, unspecified: Secondary | ICD-10-CM | POA: Diagnosis not present

## 2021-12-24 ENCOUNTER — Ambulatory Visit: Payer: Medicaid Other | Admitting: Dermatology

## 2021-12-24 DIAGNOSIS — B079 Viral wart, unspecified: Secondary | ICD-10-CM

## 2021-12-24 DIAGNOSIS — L732 Hidradenitis suppurativa: Secondary | ICD-10-CM | POA: Diagnosis not present

## 2021-12-24 DIAGNOSIS — L0291 Cutaneous abscess, unspecified: Secondary | ICD-10-CM

## 2021-12-24 DIAGNOSIS — L02416 Cutaneous abscess of left lower limb: Secondary | ICD-10-CM | POA: Diagnosis not present

## 2021-12-24 NOTE — Patient Instructions (Addendum)
Reviewed potential side effects of isotretinoin including xerosis, cheilitis, hepatitis, hyperlipidemia, and severe birth defects if taken by a pregnant woman. Reviewed reports of suicidal ideation in those with a history of depression while taking isotretinoin and reports of diagnosis of inflammatory bowl disease while taking isotretinoin as well as the lack of evidence for a causal relationship between isotretinoin, depression and IBD. Patient advised to reach out with any questions or concerns. Patient advised not to share pills or donate blood while on treatment or for one month after completing treatment.    Due to recent changes in healthcare laws, you may see results of your pathology and/or laboratory studies on MyChart before the doctors have had a chance to review them. We understand that in some cases there may be results that are confusing or concerning to you. Please understand that not all results are received at the same time and often the doctors may need to interpret multiple results in order to provide you with the best plan of care or course of treatment. Therefore, we ask that you please give [place] 2 business days to thoroughly review all your results before contacting the office for clarification. Should we see a critical lab result, you will be contacted sooner.   If You Need Anything After Your Visit  If you have any questions or concerns for your doctor, please call our main line at [phone] and press option 4 to reach your doctor's medical assistant. If no one answers, please leave a voicemail as directed and we will return your call as soon as possible. Messages left after 4 pm will be answered the following business day.   You may also send [place] a message via [place]. We typically respond to MyChart messages within 1-2 business days.  For prescription refills, please ask your pharmacy to contact our office. Our fax number is [phone].  If you have an urgent issue when the  clinic is closed that cannot wait until the next business day, you can page your doctor at the number below.    Please note that while we do our best to be available for urgent issues outside of office hours, we are not available 24/7.   If you have an urgent issue and are unable to reach [place], you may choose to seek medical care at your doctor's office, retail clinic, urgent care center, or emergency room.  If you have a medical emergency, please immediately call 911 or go to the emergency department.  Pager Numbers  - Dr. Nehemiah Massed: [phone]  - Dr. Laurence Ferrari: [phone]  - Dr. Nicole Kindred: [phone]  In the event of inclement weather, please call our main line at [phone] for an update on the status of any delays or closures.  Dermatology Medication Tips: Please keep the boxes that topical medications come in in order to help keep track of the instructions about where and how to use these. Pharmacies typically print the medication instructions only on the boxes and not directly on the medication tubes.   If your medication is too expensive, please contact our office at [phone] option 4 or send [place] a message through [place].   We are unable to tell what your co-pay for medications will be in advance as this is different depending on your insurance coverage. However, we may be able to find a substitute medication at lower cost or fill out paperwork to get insurance to cover a needed medication.   If a prior authorization is required to get your medication  covered by your insurance company, please allow [place] 1-2 business days to complete this process.  Drug prices often vary depending on where the prescription is filled and some pharmacies may offer cheaper prices.  The website www.goodrx.com contains coupons for medications through different pharmacies. The prices here do not account for what the cost may be with help from insurance (it may be cheaper with your insurance), but the  website can give you the price if you did not use any insurance.  - You can print the associated coupon and take it with your prescription to the pharmacy.  - You may also stop by our office during regular business hours and pick up a GoodRx coupon card.  - If you need your prescription sent electronically to a different pharmacy, notify our office through [facility] or by phone at [phone] option 4.     Si Usted Necesita Algo Despus de Su Visita  Tambin puede enviarnos un mensaje a travs de Pharmacist, community. Por lo general respondemos a los mensajes de MyChart en el transcurso de 1 a 2 das hbiles.  Para renovar recetas, por favor pida a su farmacia que se ponga en contacto con nuestra oficina. Harland Dingwall de fax es [place] [phone].  Si tiene un asunto urgente cuando la clnica est cerrada y que no puede esperar hasta el siguiente da hbil, puede llamar/localizar a su doctor(a) al nmero que aparece a continuacin.   Por favor, tenga en cuenta que aunque hacemos todo lo posible para estar disponibles para asuntos urgentes fuera del horario de [place], no estamos disponibles las 24 horas del da, los 7 das de la [place].   Si tiene un problema urgente y no puede comunicarse con nosotros, puede optar por buscar atencin mdica  en el consultorio de su doctor(a), en una clnica privada, en un centro de atencin urgente o en una sala de emergencias.  Si tiene Engineering geologist, por favor llame inmediatamente al 911 o vaya a la sala de emergencias.  Nmeros de bper  - Dr. Nehemiah Massed: [phone]  - Dra. Moye: [phone]  - Dra. Nicole Kindred: [phone]  En caso de inclemencias del [place], por favor llame a Johnsie Kindred principal al [phone] para una actualizacin sobre el [place] de cualquier retraso o cierre.  Consejos para la medicacin en dermatologa: Por favor, guarde las cajas en las que vienen los medicamentos de uso tpico para ayudarle a seguir las  instrucciones sobre dnde y cmo usarlos. Las farmacias generalmente imprimen las instrucciones del medicamento slo en las cajas y no directamente en los tubos del [place].   Si su medicamento es muy caro, por favor, pngase en contacto con Zigmund Daniel llamando al [phone] y presione la opcin 4 o envenos un mensaje a travs de Pharmacist, community.   No podemos decirle cul ser su copago por los medicamentos por adelantado ya que esto es diferente dependiendo de la cobertura de su seguro. Sin embargo, es posible que podamos encontrar un medicamento sustituto a Electrical engineer un formulario para que el seguro cubra el medicamento que se considera necesario.   Si se requiere una autorizacin previa para que su compaa de seguros [place] su medicamento, por favor permtanos de 1 a 2 das hbiles para completar este proceso.  Los precios de los medicamentos varan con frecuencia dependiendo del Environmental consultant de dnde se surte la receta y alguna farmacias pueden ofrecer precios ms baratos.  El sitio web www.goodrx.com tiene cupones para medicamentos de Airline pilot. Los precios aqu  en cuenta lo que podra costar con la ayuda del seguro (puede ser ms barato con su seguro), pero el sitio web puede darle el precio si no utiliz ningn seguro.  - Puede imprimir el cupn correspondiente y llevarlo con su receta a la farmacia.  - Tambin puede pasar por nuestra oficina durante el horario de atencin regular y recoger una tarjeta de cupones de GoodRx.  - Si necesita que su receta se enve electrnicamente a una farmacia diferente, informe a nuestra oficina a travs de MyChart de [place] o por telfono llamando al [phone] y presione la opcin 4.  

## 2021-12-24 NOTE — Progress Notes (Signed)
Follow-Up Visit   Subjective  Megan Bennett is a 36 y.o. female who presents for the following: HS  (Patient here today to start Isotretinoin - she has active areas today that are tender and painful. She is not taking the Minocycline because she didn't feel it was helpful and she forgot to use it every day) and wart (On the L foot - previously treated with LN2, squaric acid and cantherone plus. Chronic and persistent, painful).  The following portions of the chart were reviewed this encounter and updated as appropriate:   Tobacco  Allergies  Meds  Problems  Med Hx  Surg Hx  Fam Hx     Review of Systems:  No other skin or systemic complaints except as noted in HPI or Assessment and Plan.  Objective  Well appearing patient in no apparent distress; mood and affect are within normal limits.  A focused examination was performed including the face, groin, and feet. Relevant physical exam findings are noted in the Assessment and Plan.  Groin/thighs Draining abscess of the L med thigh.   L ant lat sole 0.5 cm verrucous papule.   Assessment & Plan  Hidradenitis suppurativa Groin/thighs  Reviewed potential side effects of isotretinoin including xerosis, cheilitis, hepatitis, hyperlipidemia, and severe birth defects if taken by a pregnant woman. Reviewed reports of suicidal ideation in those with a history of depression while taking isotretinoin and reports of diagnosis of inflammatory bowl disease while taking isotretinoin as well as the lack of evidence for a causal relationship between isotretinoin, depression and IBD. Patient advised to reach out with any questions or concerns. Patient advised not to share pills or donate blood while on treatment or for one month after completing treatment.  Pending labs start Isotretinoin '20mg'$  po QD.  Two methods of BC include tubal ligation and female latex condom  [facility] in [place], [place]   Related Procedures Comprehensive metabolic  panel Lipid panel hCG, serum, qualitative  Related Medications minocycline (MINOCIN) 50 MG capsule Take 1 capsule (50 mg total) by mouth daily. 1 po with dinner meal  Polypodium Leucotomos (HELIOCARE) 240 MG CAPS Take 1 capsule (240 mg total) by mouth as directed. 1-2 po qd as directed  Viral warts, unspecified type L ant lat sole  Discussed viral etiology and risk of spread.  Discussed multiple treatments may be required to clear warts.  Discussed possible post-treatment dyspigmentation and risk of recurrence.  Squaric acid 3% solution placed on a bandage on the R upper inner arm. Patient instructed to wash off in 24 hours.  Destruction of lesion - L ant lat sole Complexity: simple   Destruction method: cryotherapy   Informed consent: discussed and consent obtained   Timeout:  patient name, date of birth, surgical site, and procedure verified Lesion destroyed using liquid nitrogen: Yes   Region frozen until ice ball extended beyond lesion: Yes   Outcome: patient tolerated procedure well with no complications   Post-procedure details: wound care instructions given    Destruction of lesion - L ant lat sole  Destruction method: chemical removal   Informed consent: discussed and consent obtained   Timeout:  patient name, date of birth, surgical site, and procedure verified Chemical destruction method comment:  Cantharidin plus and squaric acid 3% Procedure instructions: patient instructed to wash and dry area   Outcome: patient tolerated procedure well with no complications   Post-procedure details: wound care instructions given   Additional details:  Patient advised to set alarm to remind them  to wash off with soap and water at the directed time.  Abscess Left Medial Thigh  Secondary to HS, Currently draining -   No tx needed plan to start Isotretinoin pending lab results.   Return in about 1 month (around 01/24/2022) for Isotretinoin follow up .  Luther Redo, CMA,  am acting as scribe for Sarina Ser, MD . Documentation: I have reviewed the above documentation for accuracy and completeness, and I agree with the above.  Sarina Ser, MD

## 2022-01-04 ENCOUNTER — Encounter: Payer: Self-pay | Admitting: Dermatology

## 2022-01-07 ENCOUNTER — Telehealth: Payer: Self-pay

## 2022-01-07 NOTE — Telephone Encounter (Signed)
-----   Message from Ralene Bathe, MD sent at 01/04/2022  6:05 PM EDT ----- Please check on patient to see if she is can get her labs prior to starting isotretinoin treatment.

## 2022-01-07 NOTE — Telephone Encounter (Signed)
Left message on voicemail to return my call.  

## 2022-01-08 DIAGNOSIS — Z419 Encounter for procedure for purposes other than remedying health state, unspecified: Secondary | ICD-10-CM | POA: Diagnosis not present

## 2022-01-10 ENCOUNTER — Telehealth: Payer: Self-pay

## 2022-01-10 DIAGNOSIS — L732 Hidradenitis suppurativa: Secondary | ICD-10-CM | POA: Diagnosis not present

## 2022-01-10 NOTE — Telephone Encounter (Signed)
Patient returned call to let you know she was having blood work done this afternoon. Appointment was moved out several days due to 28 day period. aw

## 2022-01-10 NOTE — Telephone Encounter (Signed)
Left message on voicemail to return my call.  

## 2022-01-10 NOTE — Telephone Encounter (Signed)
-----   Message from Ralene Bathe, MD sent at 01/04/2022  6:05 PM EDT ----- Please check on patient to see if she is can get her labs prior to starting isotretinoin treatment.

## 2022-01-11 LAB — COMPREHENSIVE METABOLIC PANEL
ALT: 17 IU/L (ref 0–32)
AST: 13 IU/L (ref 0–40)
Albumin/Globulin Ratio: 1.6 (ref 1.2–2.2)
Albumin: 4.3 g/dL (ref 3.9–4.9)
Alkaline Phosphatase: 91 IU/L (ref 44–121)
BUN/Creatinine Ratio: 21 (ref 9–23)
BUN: 22 mg/dL — ABNORMAL HIGH (ref 6–20)
Bilirubin Total: <0.2 mg/dL (ref 0.0–1.2)
CO2: 23 mmol/L (ref 20–29)
Calcium: 9.8 mg/dL (ref 8.7–10.2)
Chloride: 103 mmol/L (ref 96–106)
Creatinine, Ser: 1.04 mg/dL — ABNORMAL HIGH (ref 0.57–1.00)
Globulin, Total: 2.7 g/dL (ref 1.5–4.5)
Glucose: 77 mg/dL (ref 70–99)
Potassium: 4.4 mmol/L (ref 3.5–5.2)
Sodium: 141 mmol/L (ref 134–144)
Total Protein: 7 g/dL (ref 6.0–8.5)
eGFR: 71 mL/min/{1.73_m2} (ref >59)

## 2022-01-11 LAB — LIPID PANEL
Chol/HDL Ratio: 3.5 ratio (ref 0.0–4.4)
Cholesterol, Total: 197 mg/dL (ref 100–199)
HDL: 57 mg/dL (ref >39)
LDL Chol Calc (NIH): 111 mg/dL — ABNORMAL HIGH (ref 0–99)
Triglycerides: 167 mg/dL — ABNORMAL HIGH (ref 0–149)
VLDL Cholesterol Cal: 29 mg/dL (ref 5–40)

## 2022-01-11 LAB — HCG, SERUM, QUALITATIVE: hCG,Beta Subunit,Qual,Serum: NEGATIVE m[IU]/mL (ref <6)

## 2022-01-14 ENCOUNTER — Other Ambulatory Visit: Payer: Self-pay

## 2022-01-14 ENCOUNTER — Encounter: Payer: Self-pay | Admitting: Nurse Practitioner

## 2022-01-14 ENCOUNTER — Telehealth: Payer: Self-pay

## 2022-01-14 ENCOUNTER — Ambulatory Visit: Payer: Medicaid Other | Admitting: Nurse Practitioner

## 2022-01-14 VITALS — BP 124/76 | HR 86 | Temp 98.8°F | Ht 67.99 in | Wt 275.6 lb

## 2022-01-14 DIAGNOSIS — L732 Hidradenitis suppurativa: Secondary | ICD-10-CM

## 2022-01-14 DIAGNOSIS — F419 Anxiety disorder, unspecified: Secondary | ICD-10-CM | POA: Diagnosis not present

## 2022-01-14 DIAGNOSIS — M797 Fibromyalgia: Secondary | ICD-10-CM

## 2022-01-14 DIAGNOSIS — E039 Hypothyroidism, unspecified: Secondary | ICD-10-CM

## 2022-01-14 MED ORDER — IBUPROFEN 800 MG PO TABS: 800.0000 mg | ORAL_TABLET | Freq: Three times a day (TID) | ORAL | 1 refills | Status: DC | PRN

## 2022-01-14 MED ORDER — SUMATRIPTAN SUCCINATE 100 MG PO TABS: 100.0000 mg | ORAL_TABLET | Freq: Every day | ORAL | 1 refills | Status: DC | PRN

## 2022-01-14 MED ORDER — TRIAMCINOLONE ACETONIDE 0.1 % EX CREA: 1.0000 | TOPICAL_CREAM | Freq: Two times a day (BID) | CUTANEOUS | 0 refills | Status: DC

## 2022-01-14 MED ORDER — ISOTRETINOIN 20 MG PO CAPS: ORAL_CAPSULE | ORAL | 0 refills | Status: DC

## 2022-01-14 MED ORDER — DULOXETINE HCL 60 MG PO CPEP: 60.0000 mg | ORAL_CAPSULE | Freq: Every day | ORAL | 1 refills | Status: DC

## 2022-01-14 MED ORDER — PROAIR HFA 108 (90 BASE) MCG/ACT IN AERS: 2.0000 | INHALATION_SPRAY | Freq: Four times a day (QID) | RESPIRATORY_TRACT | 1 refills | Status: DC | PRN

## 2022-01-14 NOTE — Progress Notes (Signed)
Patient was at PCP today and nurse sent message to our office that pt has developed rash from squaric acid applied in our office 3 weeks ago. Nurse sent photo, Dr. Nehemiah Massed reviewed and [facility] 0.1% cream was sent in for pt to use twice daily. Avoid applying to face, groin, and axilla. Use as directed. Long-term use can cause thinning of the skin. Lurlean Horns., RMA

## 2022-01-14 NOTE — Assessment & Plan Note (Signed)
Chronic.  Controlled.  Continue with current medication regimen of Levothyroxine 25mcg daily.  Labs ordered today.  Return to clinic in 6 months for reevaluation.  Call sooner if concerns arise.   

## 2022-01-14 NOTE — Assessment & Plan Note (Signed)
Chronic.  Controlled.  Continue with current medication regimen.  However, having a lot of pain from Fibromyalgia.  Will increase to Duloxetine '60mg'$ .  Labs ordered today.  Return to clinic in 6 months for reevaluation.  Call sooner if concerns arise.

## 2022-01-14 NOTE — Telephone Encounter (Signed)
-----   Message from Ralene Bathe, MD sent at 01/11/2022 12:47 PM EDT ----- Lab from 01/10/2022 are all OK: Chemistries; liver; kidney OK Lipids OK (slightly elevated Triglycerides - pt to discuss w PCP) Pregnancy negative  STOP ALL OTHER MEDICATION FOR acne and hidradenitis (stop Minocycline)  May start Isotretinoin 20 mg 1 po qd w food #30 0rf - send to pharmacy Advise pt to do I-Pledge confirmations Keep 1 month fu appt

## 2022-01-14 NOTE — Assessment & Plan Note (Signed)
Chronic. Uses Ibuprofen PRN.  Discussed risks of taking NSAIDS. Patient is okay with continuing.

## 2022-01-14 NOTE — Progress Notes (Signed)
BP 124/76   Pulse 86   Temp 98.8 F (37.1 C) (Oral)   Ht 5' 7.99" (1.727 m)   Wt 275 lb 9.6 oz (125 kg)   SpO2 98%   BMI 41.91 kg/m    Subjective:    Patient ID: Megan Bennett, female    DOB: 07/31/1985, 36 y.o.   MRN: [id number]  HPI: Megan Bennett is a 35 y.o. female  Chief Complaint  Patient presents with   Depression   Anxiety   Rash    ON right forearm and right upper arm.    ANXIETY Patient states her anxiety is improved. Patient states she feels like the Cymbalta is helping her mood.  She doesn't feel like the Cymbalta is helping her pain anymore.    Denies SI.     01/14/2022    3:12 PM 10/15/2021    2:43 PM 09/10/2021    2:29 PM 08/13/2021    1:53 PM  GAD 7 : Generalized Anxiety Score  Nervous, Anxious, on Edge '1 1 2 2  ' Control/stop worrying 0 0 1 2  Worry too much - different things 0 '1 1 2  ' Trouble relaxing 0 0 1 2  Restless 0 0 1 1  Easily annoyed or irritable 0 0 1 1  Afraid - awful might happen 0 0 0 1  Total GAD 7 Score '1 2 7 11  ' Anxiety Difficulty Not difficult at all Somewhat difficult Somewhat difficult Somewhat difficult   Flowsheet Row Office Visit from 01/14/2022 in [place]  PHQ-9 Total Score 5       HYPOTHYROIDISM Thyroid control status:controlled Satisfied with current treatment? yes Medication side effects: no Medication compliance: excellent compliance Etiology of hypothyroidism:  Recent dose adjustment:no Fatigue: yes Cold intolerance: no Heat intolerance: no Weight gain: no Weight loss: no Constipation: no Diarrhea/loose stools: no Palpitations: no Lower extremity edema: no Anxiety/depressed mood: yes    Relevant past medical, surgical, family and social history reviewed and updated as indicated. Interim medical history since our last visit reviewed. Allergies and medications reviewed and updated.  Review of Systems  Constitutional:  Negative for fatigue and unexpected weight change.   Cardiovascular:  Negative for palpitations and leg swelling.  Gastrointestinal:  Negative for constipation and diarrhea.  Endocrine: Negative for cold intolerance and heat intolerance.  Musculoskeletal:  Positive for myalgias.  Psychiatric/Behavioral:  Positive for dysphoric mood. The patient is nervous/anxious.     Per HPI unless specifically indicated above     Objective:    BP 124/76   Pulse 86   Temp 98.8 F (37.1 C) (Oral)   Ht 5' 7.99" (1.727 m)   Wt 275 lb 9.6 oz (125 kg)   SpO2 98%   BMI 41.91 kg/m   Wt Readings from Last 3 Encounters:  01/14/22 275 lb 9.6 oz (125 kg)  10/15/21 275 lb (124.7 kg)  09/10/21 271 lb 3.2 oz (123 kg)    Physical Exam Vitals and nursing note reviewed.  Constitutional:      General: She is not in acute distress.    Appearance: Normal appearance. She is obese. She is not ill-appearing, toxic-appearing or diaphoretic.  HENT:     Head: Normocephalic.     Right Ear: External ear normal.     Left Ear: External ear normal.     Nose: Nose normal.     Mouth/Throat:     Mouth: Mucous membranes are moist.     Pharynx: Oropharynx is clear.  Eyes:     General:        Right eye: No discharge.        Left eye: No discharge.     Extraocular Movements: Extraocular movements intact.     Conjunctiva/sclera: Conjunctivae normal.     Pupils: Pupils are equal, round, and reactive to light.  Cardiovascular:     Rate and Rhythm: Normal rate and regular rhythm.     Heart sounds: No murmur heard. Pulmonary:     Effort: Pulmonary effort is normal. No respiratory distress.     Breath sounds: Normal breath sounds. No wheezing or rales.  Musculoskeletal:     Cervical back: Normal range of motion and neck supple.  Skin:    General: Skin is warm and dry.     Capillary Refill: Capillary refill takes less than 2 seconds.  Neurological:     General: No focal deficit present.     Mental Status: She is alert and oriented to person, place, and time. Mental  status is at baseline.  Psychiatric:        Mood and Affect: Mood normal.        Behavior: Behavior normal.        Thought Content: Thought content normal.        Judgment: Judgment normal.     Results for orders placed or performed in visit on 12/24/21  Comprehensive metabolic panel  Result Value Ref Range   Glucose 77 70 - 99 mg/dL   BUN 22 (H) 6 - 20 mg/dL   Creatinine, Ser 1.04 (H) 0.57 - 1.00 mg/dL   eGFR 71 >59 mL/min/1.73   BUN/Creatinine Ratio 21 9 - 23   Sodium 141 134 - 144 mmol/L   Potassium 4.4 3.5 - 5.2 mmol/L   Chloride 103 96 - 106 mmol/L   CO2 23 20 - 29 mmol/L   Calcium 9.8 8.7 - 10.2 mg/dL   Total Protein 7.0 6.0 - 8.5 g/dL   Albumin 4.3 3.9 - 4.9 g/dL   Globulin, Total 2.7 1.5 - 4.5 g/dL   Albumin/Globulin Ratio 1.6 1.2 - 2.2   Bilirubin Total <0.2 0.0 - 1.2 mg/dL   Alkaline Phosphatase 91 44 - 121 IU/L   AST 13 0 - 40 IU/L   ALT 17 0 - 32 IU/L  Lipid panel  Result Value Ref Range   Cholesterol, Total 197 100 - 199 mg/dL   Triglycerides 167 (H) 0 - 149 mg/dL   HDL 57 >39 mg/dL   VLDL Cholesterol Cal 29 5 - 40 mg/dL   LDL Chol Calc (NIH) 111 (H) 0 - 99 mg/dL   Chol/HDL Ratio 3.5 0.0 - 4.4 ratio  hCG, serum, qualitative  Result Value Ref Range   hCG,Beta Subunit,Qual,Serum Negative Negative <6 mIU/mL      Assessment & Plan:   Problem List Items Addressed This Visit       Endocrine   Acquired hypothyroidism    Chronic.  Controlled.  Continue with current medication regimen of Levothyroxine 11mg daily.  Labs ordered today.  Return to clinic in 6 months for reevaluation.  Call sooner if concerns arise.        Relevant Orders   TSH   T4, free     Other   Fibromyalgia    Chronic. Uses Ibuprofen PRN.  Discussed risks of taking NSAIDS. Patient is okay with continuing.      Relevant Medications   DULoxetine (CYMBALTA) 60 MG capsule   ibuprofen (ADVIL) 800 MG  tablet   Anxiety - Primary    Chronic.  Controlled.  Continue with current  medication regimen.  However, having a lot of pain from Fibromyalgia.  Will increase to Duloxetine 11m.  Labs ordered today.  Return to clinic in 6 months for reevaluation.  Call sooner if concerns arise.        Relevant Medications   DULoxetine (CYMBALTA) 60 MG capsule   Other Relevant Orders   Comp Met (CMET)     Follow up plan: Return in about 6 months (around 07/17/2022) for HTN, HLD, DM2 FU.

## 2022-01-14 NOTE — Telephone Encounter (Signed)
Patient informed of lab results, confirmed in [facility] program, and prescription sent to pharmacy. Advised patient to stop all other HS medications and only use the Isotretinoin. Patient states that she never received a text from [facility] so I updated her preferred contact information to e-mail. Advised patient to call [facility] if she doesn't receive e-mail. I gave her their phone number and her [facility] ID number. Advised patient that since her labs were drawn next week she must pick up her prescription within the next two days or she will be locked out of [facility] and must wait another 30 days to pick up her prescription (after another negative pregnancy test). Patient verbalized understanding.

## 2022-01-15 LAB — COMPREHENSIVE METABOLIC PANEL
ALT: 14 IU/L (ref 0–32)
AST: 15 IU/L (ref 0–40)
Albumin/Globulin Ratio: 1.5 (ref 1.2–2.2)
Albumin: 4 g/dL (ref 3.9–4.9)
Alkaline Phosphatase: 82 IU/L (ref 44–121)
BUN/Creatinine Ratio: 23 (ref 9–23)
BUN: 15 mg/dL (ref 6–20)
Bilirubin Total: 0.4 mg/dL (ref 0.0–1.2)
CO2: 20 mmol/L (ref 20–29)
Calcium: 9.2 mg/dL (ref 8.7–10.2)
Chloride: 106 mmol/L (ref 96–106)
Creatinine, Ser: 0.66 mg/dL (ref 0.57–1.00)
Globulin, Total: 2.6 g/dL (ref 1.5–4.5)
Glucose: 93 mg/dL (ref 70–99)
Potassium: 3.9 mmol/L (ref 3.5–5.2)
Sodium: 142 mmol/L (ref 134–144)
Total Protein: 6.6 g/dL (ref 6.0–8.5)
eGFR: 115 mL/min/{1.73_m2} (ref >59)

## 2022-01-15 LAB — TSH: TSH: 3.49 u[IU]/mL (ref 0.450–4.500)

## 2022-01-15 LAB — T4, FREE: Free T4: 1.07 ng/dL (ref 0.82–1.77)

## 2022-01-15 NOTE — Progress Notes (Signed)
Good Morning. It was nice to see you yesterday.  Your lab work looks good.  No concerns at this time. Continue with your current medication regimen.  Follow up as discussed.  Please let me know if you have any questions.  

## 2022-02-07 ENCOUNTER — Ambulatory Visit: Payer: Medicaid Other | Admitting: Dermatology

## 2022-02-07 VITALS — Wt 275.0 lb

## 2022-02-07 DIAGNOSIS — K130 Diseases of lips: Secondary | ICD-10-CM | POA: Diagnosis not present

## 2022-02-07 DIAGNOSIS — L701 Acne conglobata: Secondary | ICD-10-CM | POA: Diagnosis not present

## 2022-02-07 DIAGNOSIS — L853 Xerosis cutis: Secondary | ICD-10-CM | POA: Diagnosis not present

## 2022-02-07 DIAGNOSIS — Z79899 Other long term (current) drug therapy: Secondary | ICD-10-CM | POA: Diagnosis not present

## 2022-02-07 DIAGNOSIS — L732 Hidradenitis suppurativa: Secondary | ICD-10-CM | POA: Diagnosis not present

## 2022-02-07 DIAGNOSIS — B078 Other viral warts: Secondary | ICD-10-CM

## 2022-02-07 MED ORDER — ISOTRETINOIN 30 MG PO CAPS: 30.0000 mg | ORAL_CAPSULE | Freq: Every day | ORAL | 0 refills | Status: DC

## 2022-02-07 NOTE — Patient Instructions (Addendum)
Instructions for After In-Office Application of Cantharidin  1. This is a strong medicine; please follow ALL instructions.  2. Gently wash off with soap and water in four hours or sooner s directed by your physician.  3. **WARNING** this medicine can cause severe blistering, blood blisters, infection, and/or scarring if it is not washed off as directed.  4. Your progress will be rechecked in 1-2 months; call sooner if there are any questions or problems.  Cryotherapy Aftercare  Wash gently with soap and water everyday.   Apply Vaseline and Band-Aid daily until healed.   Due to recent changes in healthcare laws, you may see results of your pathology and/or laboratory studies on MyChart before the doctors have had a chance to review them. We understand that in some cases there may be results that are confusing or concerning to you. Please understand that not all results are received at the same time and often the doctors may need to interpret multiple results in order to provide you with the best plan of care or course of treatment. Therefore, we ask that you please give us 2 business days to thoroughly review all your results before contacting the office for clarification. Should we see a critical lab result, you will be contacted sooner.   If You Need Anything After Your Visit  If you have any questions or concerns for your doctor, please call our main line at [phone] and press option 4 to reach your doctor's medical assistant. If no one answers, please leave a voicemail as directed and we will return your call as soon as possible. Messages left after 4 pm will be answered the following business day.   You may also send us a message via MyChart. We typically respond to MyChart messages within 1-2 business days.  For prescription refills, please ask your pharmacy to contact our office. Our fax number is [phone].  If you have an urgent issue when the clinic is closed that cannot  wait until the next business day, you can page your doctor at the number below.    Please note that while we do our best to be available for urgent issues outside of office hours, we are not available 24/7.   If you have an urgent issue and are unable to reach us, you may choose to seek medical care at your doctor's office, retail clinic, urgent care center, or emergency room.  If you have a medical emergency, please immediately call 911 or go to the emergency department.  Pager Numbers  - Dr. Kowalski: [phone]  - Dr. Moye: [phone]  - Dr. Stewart: [phone]  In the event of inclement weather, please call our main line at [phone] for an update on the status of any delays or closures.  Dermatology Medication Tips: Please keep the boxes that topical medications come in in order to help keep track of the instructions about where and how to use these. Pharmacies typically print the medication instructions only on the boxes and not directly on the medication tubes.   If your medication is too expensive, please contact our office at [phone] option 4 or send us a message through MyChart.   We are unable to tell what your co-pay for medications will be in advance as this is different depending on your insurance coverage. However, we may be able to find a substitute medication at lower cost or fill out paperwork to get insurance to cover a needed medication.   If a prior authorization is   required to get your medication covered by your insurance company, please allow us 1-2 business days to complete this process.  Drug prices often vary depending on where the prescription is filled and some pharmacies may offer cheaper prices.  The website www.goodrx.com contains coupons for medications through different pharmacies. The prices here do not account for what the cost may be with help from insurance (it may be cheaper with your insurance), but the website can give you the price  if you did not use any insurance.  - You can print the associated coupon and take it with your prescription to the pharmacy.  - You may also stop by our office during regular business hours and pick up a GoodRx coupon card.  - If you need your prescription sent electronically to a different pharmacy, notify our office through [facility] MyChart or by phone at [phone] option 4.     Si Usted Necesita Algo Despus de Su Visita  Tambin puede enviarnos un mensaje a travs de MyChart. Por lo general respondemos a los mensajes de MyChart en el transcurso de 1 a 2 das hbiles.  Para renovar recetas, por favor pida a su farmacia que se ponga en contacto con nuestra oficina. Nuestro nmero de fax es el [phone].  Si tiene un asunto urgente cuando la clnica est cerrada y que no puede esperar hasta el siguiente da hbil, puede llamar/localizar a su doctor(a) al nmero que aparece a continuacin.   Por favor, tenga en cuenta que aunque hacemos todo lo posible para estar disponibles para asuntos urgentes fuera del horario de oficina, no estamos disponibles las 24 horas del da, los 7 das de la semana.   Si tiene un problema urgente y no puede comunicarse con nosotros, puede optar por buscar atencin mdica  en el consultorio de su doctor(a), en una clnica privada, en un centro de atencin urgente o en una sala de emergencias.  Si tiene una emergencia mdica, por favor llame inmediatamente al 911 o vaya a la sala de emergencias.  Nmeros de bper  - Dr. Kowalski: [phone]  - Dra. Moye: [phone]  - Dra. Stewart: [phone]  En caso de inclemencias del tiempo, por favor llame a nuestra lnea principal al [phone] para una actualizacin sobre el estado de cualquier retraso o cierre.  Consejos para la medicacin en dermatologa: Por favor, guarde las cajas en las que vienen los medicamentos de uso tpico para ayudarle a seguir las instrucciones sobre dnde y cmo usarlos.  Las farmacias generalmente imprimen las instrucciones del medicamento slo en las cajas y no directamente en los tubos del medicamento.   Si su medicamento es muy caro, por favor, pngase en contacto con nuestra oficina llamando al [phone] y presione la opcin 4 o envenos un mensaje a travs de MyChart.   No podemos decirle cul ser su copago por los medicamentos por adelantado ya que esto es diferente dependiendo de la cobertura de su seguro. Sin embargo, es posible que podamos encontrar un medicamento sustituto a menor costo o llenar un formulario para que el seguro cubra el medicamento que se considera necesario.   Si se requiere una autorizacin previa para que su compaa de seguros cubra su medicamento, por favor permtanos de 1 a 2 das hbiles para completar este proceso.  Los precios de los medicamentos varan con frecuencia dependiendo del lugar de dnde se surte la receta y alguna farmacias pueden ofrecer precios ms baratos.  El sitio web www.goodrx.com tiene cupones para medicamentos de   diferentes farmacias. Los precios aqu no tienen en cuenta lo que podra costar con la ayuda del seguro (puede ser ms barato con su seguro), pero el sitio web puede darle el precio si no utiliz ningn seguro.  - Puede imprimir el cupn correspondiente y llevarlo con su receta a la farmacia.  - Tambin puede pasar por nuestra oficina durante el horario de atencin regular y recoger una tarjeta de cupones de GoodRx.  - Si necesita que su receta se enve electrnicamente a una farmacia diferente, informe a nuestra oficina a travs de MyChart de [facility] o por telfono llamando al [phone] y presione la opcin 4.  

## 2022-02-07 NOTE — Progress Notes (Signed)
Isotretinoin Follow-Up Visit   Subjective  Megan Bennett is a 36 y.o. female who presents for the following: hidradenitis suppurativa (Patient here today for accutane follow up for hidradenitis suppurativa at the thighs and groin) and Warts (f/u on wart on the left foot treated with LN2 and Cantharidin 1 month ago with a fair response, wart still sometimes painful).  Week # 4   Isotretinoin F/U - 02/07/22 1500       Isotretinoin Follow Up   Date 02/07/22    Weight 275 lb (124.7 kg)    Two Forms of Birth Control Tubal Sterilization;Female Vasectomy      Dosage   Target Dosage (mg) 40086    Current (To Date) Dosage (mg) 600    To Go Dosage (mg) 24340      Side Effects   Skin Dry Lips    Gastrointestinal WNL    Neurological WNL    Constitutional WNL             Side effects: Dry skin, dry lips  Denies changes in night vision, shortness of breath, abdominal pain, nausea, vomiting, diarrhea, blood in stool or urine, visual changes, headaches, epistaxis, joint pain, myalgias, mood changes, depression, or suicidal ideation.   Patient is not pregnant, not seeking pregnancy, and not breastfeeding.  The following portions of the chart were reviewed this encounter and updated as appropriate: medications, allergies, medical history  Review of Systems:  No other skin or systemic complaints except as noted in HPI or Assessment and Plan.  Objective  Well appearing patient in no apparent distress; mood and affect are within normal limits.  An examination of the face, neck, chest, and back was performed and relevant findings are noted below.   left anterior sole Verrucous papules -- Discussed viral etiology and contagion.    Assessment & Plan   Hidradenitis suppurativa And Acne groin and thighs  Hidradenitis Suppurativa is a chronic; persistent; non-curable, but treatable condition due to abnormal inflamed sweat glands in the body folds (axilla, inframammary, groin,  medial thighs), causing recurrent painful draining cysts and scarring. It can be associated with severe scarring acne and cysts; also abscesses and scarring of scalp. The goal is control and prevention of flares, as it is not curable. Scars are permanent and can be thickened. Treatment may include daily use of topical medication and oral antibiotics.  Oral isotretinoin may also be helpful.  For more severe cases, Humira (a biologic injection) may be prescribed to decrease the inflammatory process and prevent flares.  When indicated, inflamed cysts may also be treated surgically.  Increase to Isotretinoin 30 mg once a day    Reviewed potential side effects of isotretinoin including xerosis, cheilitis, hepatitis, hyperlipidemia, and severe birth defects if taken by a pregnant woman. Reviewed reports of suicidal ideation in those with a history of depression while taking isotretinoin and reports of diagnosis of inflammatory bowl disease while taking isotretinoin as well as the lack of evidence for a causal relationship between isotretinoin, depression and IBD. Patient advised to reach out with any questions or concerns. Patient advised not to share pills or donate blood while on treatment or for one month after completing treatment.   Urine pregnancy test performed in office today and was negative.  Patient demonstrates comprehension and confirms she will not get pregnant.    Two methods of BC include tubal ligation and female latex condom  [facility] in [place], [place]     ISOtretinoin (ACCUTANE) 30 MG capsule -  groin and thighs Take 1 capsule (30 mg total) by mouth daily.  Other viral warts left anterior sole  Discussed viral etiology and risk of spread.  Discussed multiple treatments may be required to clear warts.  Discussed possible post-treatment dyspigmentation and risk of recurrence.   LN2 x 1 Squaric Acid 3% applied to warts today. Prior to application reviewed risk of inflammation and irritation.   Cantharidin plus applied   Destruction of lesion - left anterior sole Complexity: simple   Destruction method: cryotherapy   Informed consent: discussed and consent obtained   Timeout:  patient name, date of birth, surgical site, and procedure verified Lesion destroyed using liquid nitrogen: Yes   Region frozen until ice ball extended beyond lesion: Yes   Outcome: patient tolerated procedure well with no complications   Post-procedure details: wound care instructions given   Additional details:  Patient advised to set alarm to remind them to wash off with soap and water at the directed time.  Destruction of lesion - left anterior sole  Destruction method: chemical removal   Destruction method comment:  Squaric acid 3% Informed consent: discussed and consent obtained   Timeout:  patient name, date of birth, surgical site, and procedure verified Chemical destruction method: cantharidin   Procedure instructions: patient instructed to wash and dry area   Outcome: patient tolerated procedure well with no complications   Post-procedure details: wound care instructions given   Additional details:  Patient advised to set alarm to remind them to wash off with soap and water at the directed time.   Xerosis secondary to isotretinoin therapy - Continue emollients as directed  Cheilitis secondary to isotretinoin therapy - Continue lip balm as directed, Dr. Luvenia Heller Cortibalm recommended  Long term medication management (isotretinoin) - While taking Isotretinoin and for 30 days after you finish the medication, do not get pregnant, do not share pills, do not donate blood. Isotretinoin is best absorbed when taken with a fatty meal. Isotretinoin can make you sensitive to the sun. Daily careful sun protection including sunscreen SPF 30+ when outdoors is recommended.  Follow-up in 30 days.  IMarye Round, CMA, am acting as scribe for Sarina Ser, MD .  Documentation: I have reviewed the  above documentation for accuracy and completeness, and I agree with the above.  Sarina Ser, MD

## 2022-02-08 DIAGNOSIS — Z419 Encounter for procedure for purposes other than remedying health state, unspecified: Secondary | ICD-10-CM | POA: Diagnosis not present

## 2022-02-10 ENCOUNTER — Other Ambulatory Visit: Payer: Self-pay | Admitting: Nurse Practitioner

## 2022-02-12 NOTE — Telephone Encounter (Signed)
Requested medication (s) are due for refill today: yes  Requested medication (s) are on the active medication list: yes   Last refill:  11/07/21 #4 g 1 refills   Future visit scheduled: yes in 5 mnths  Notes to clinic:  medication not assigned to a protocol. Do you want to refill Rx?     Requested Prescriptions  Pending Prescriptions Disp Refills   STIOLTO RESPIMAT 2.5-2.5 MCG/ACT AERS [Pharmacy Med Name: STIOLTO RESPIMAT 2.5/2.5MCG INH 4GM] 4 g 1    Sig: INHALE 2 PUFFS BY MOUTH DAILY     Off-Protocol Failed - 02/10/2022  9:44 AM      Failed - Medication not assigned to a protocol, review manually.      Passed - Valid encounter within last 12 months    Recent Outpatient Visits           4 weeks ago [place], NP   4 months ago [place], NP   5 months ago [place], NP   6 months ago [place], NP   8 months ago [place] Jon Billings, NP       Future Appointments             In 3 weeks Ralene Bathe, MD [place]   In 5 months Jon Billings, NP [facility], [place]

## 2022-02-15 ENCOUNTER — Encounter: Payer: Self-pay | Admitting: Dermatology

## 2022-02-22 ENCOUNTER — Ambulatory Visit: Payer: Self-pay | Admitting: *Deleted

## 2022-02-22 NOTE — Telephone Encounter (Signed)
  Chief Complaint: carpal tunnel Symptoms: wrist and hand pain Frequency: bilateral, hurts constantling Pertinent Negatives: Patient denies fever, injury Disposition: '[]'$ ED /'[]'$ Urgent Care (no appt availability in office) / '[x]'$ Appointment(In office/virtual)/ '[]'$  [facility]/ '[]'$ Home Care/ '[]'$ Refused Recommended Disposition /'[]'$ [facility]/ '[]'$  Follow-up with PCP Additional Notes: Appt for Monday for referral for carpal tunnel.  Reason for Disposition  [1] MODERATE pain (e.g., interferes with normal activities) AND [2] present > 3 days  Answer Assessment - Initial Assessment Questions 1. : "When did the pain start?"     6 years 2. LOCATION: "Where is the pain located?"     Both hands and wrist 3. PAIN: "How bad is the pain?" (Scale 1-10; or mild, moderate, severe)   - MILD (1-3): doesn't interfere with normal activities   - MODERATE (4-7): interferes with normal activities (e.g., work or school) or awakens from sleep   - SEVERE (8-10): excruciating pain, unable to use hand at all     7 - 10  4. WORK OR EXERCISE: "Has there been any recent work or exercise that involved this part (i.e., hand or wrist) of the body?"     Prevents activity 5. CAUSE: "What do you think is causing the pain?"     Carpal tunnel 6. AGGRAVATING FACTORS: "What makes the pain worse?" (e.g., using computer)     Using the hands 7. OTHER SYMPTOMS: "Do you have any other symptoms?" (e.g., neck pain, swelling, rash, numbness, fever)     no 8. PREGNANCY: "Is there any chance you are pregnant?" "When was your last menstrual period?"     no  Protocols used: Hand and Wrist Pain-A-AH

## 2022-02-23 NOTE — Patient Instructions (Signed)
Carpal Tunnel Syndrome  Carpal tunnel syndrome is a condition that causes pain, weakness, and numbness in your hand and arm. Numbness is when you cannot feel an area in your body. The carpal tunnel is a narrow area that is on the palm side of your wrist. Repeated wrist motion or certain diseases may cause swelling in the tunnel. This swelling can pinch the main nerve in the wrist. This nerve is called the median nerve. What are the causes? This condition may be caused by: Moving your hand and wrist over and over again while doing a task. Injury to the wrist. Arthritis. A sac of fluid (cyst) or abnormal growth (tumor) in the carpal tunnel. Fluid buildup during pregnancy. Use of tools that vibrate. Sometimes the cause is not known. What increases the risk? The following factors may make you more likely to have this condition: Having a job that makes you do these things: Move your hand over and over again. Work with tools that vibrate, such as drills or sanders. Being a woman. Having diabetes, obesity, thyroid problems, or kidney failure. What are the signs or symptoms? Symptoms of this condition include: A tingling feeling in your fingers. Tingling or loss of feeling in your hand. Pain in your entire arm. This pain may get worse when you bend your wrist and elbow for a long time. Pain in your wrist that goes up your arm to your shoulder. Pain that goes down into your palm or fingers. Weakness in your hands. You may find it hard to grab and hold items. You may feel worse at night. How is this treated? This condition may be treated with: Lifestyle changes. You will be asked to stop or change the activity that caused your problem. Doing exercises and activities that make bones, muscles, and tendons stronger (physical therapy). Learning how to use your hand again (occupational therapy). Medicines for pain and swelling. You may have injections in your wrist. A wrist splint or  brace. Surgery. Follow these instructions at home: If you have a splint or brace: Wear the splint or brace as told by your doctor. Take it off only as told by your doctor. Loosen the splint if your fingers: Tingle. Become numb. Turn cold and blue. Keep the splint or brace clean. If the splint or brace is not waterproof: Do not let it get wet. Cover it with a watertight covering when you take a bath or a shower. Managing pain, stiffness, and swelling If told, put ice on the painful area: If you have a removable splint or brace, remove it as told by your doctor. Put ice in a plastic bag. Place a towel between your skin and the bag. Leave the ice on for 20 minutes, 2-3 times per day. Do not fall asleep with the cold pack on your skin. Take off the ice if your skin turns bright red. This is very important. If you cannot feel pain, heat, or cold, you have a greater risk of damage to the area. Move your fingers often to reduce stiffness and swelling. General instructions Take over-the-counter and prescription medicines only as told by your doctor. Rest your wrist from any activity that may cause pain. If needed, talk with your boss at work about changes that can help your wrist heal. Do exercises as told by your doctor, physical therapist, or occupational therapist. Keep all follow-up visits. Contact a doctor if: You have new symptoms. Medicine does not help your pain. Your symptoms get worse. Get help right  away if: You have very bad numbness or tingling in your wrist or hand. Summary Carpal tunnel syndrome is a condition that causes pain in your hand and arm. It is often caused by repeated wrist motions. Lifestyle changes and medicines are used to treat this problem. Surgery may help in very bad cases. Follow your doctor's instructions about wearing a splint, resting your wrist, keeping follow-up visits, and calling for help. This information is not intended to replace advice given  to you by your health care provider. Make sure you discuss any questions you have with your health care provider. Document Revised: 10/07/2019 Document Reviewed: 10/07/2019 Elsevier Patient Education  2023 [employer]  

## 2022-02-25 ENCOUNTER — Ambulatory Visit (INDEPENDENT_AMBULATORY_CARE_PROVIDER_SITE_OTHER): Payer: Medicaid Other | Admitting: Nurse Practitioner

## 2022-02-25 ENCOUNTER — Encounter: Payer: Self-pay | Admitting: Nurse Practitioner

## 2022-02-25 VITALS — BP 108/75 | HR 86 | Temp 98.2°F | Ht 67.0 in | Wt 275.2 lb

## 2022-02-25 DIAGNOSIS — G5603 Carpal tunnel syndrome, bilateral upper limbs: Secondary | ICD-10-CM | POA: Diagnosis not present

## 2022-02-25 MED ORDER — PREDNISONE 10 MG PO TABS: ORAL_TABLET | ORAL | 0 refills | Status: DC

## 2022-02-25 NOTE — Assessment & Plan Note (Signed)
Chronic issue with current worsening symptoms.  New referral to ortho placed, Dr. Rudene Christians, and will start Prednisone taper while waiting to get into ortho which may offer some benefit.  Recommend she continue at home therapy at this time.

## 2022-02-25 NOTE — Progress Notes (Signed)
BP 108/75   Pulse 86   Temp 98.2 F (36.8 C) (Oral)   Ht '5\' 7"'  (1.702 m)   Wt 275 lb 3.2 oz (124.8 kg)   SpO2 96%   BMI 43.10 kg/m    Subjective:    Patient ID: Megan Bennett, female    DOB: 1986/06/06, 36 y.o.   MRN: [id number]  HPI: Megan Bennett is a 36 y.o. female  Chief Complaint  Patient presents with   Carpal Tunnel    Patient is here for possible Carpal Tunnel symptoms. Patient says her hands are keeping her up every night. Patient says she is having pain and numbness. Patient says she is not able to wash all the dishes at once and has to wash them in different sets. Patient says she has to have a new referral every year to see the specialist. Patient says she has tried Ibuprofen and Aleve and neither seem to be helping.    CARPAL TUNNEL Diagnosed 7-8 years ago with CTS, had shots in past (cortisone) this worked for a long time.  Was electrician and used hands/wrists a lot. Then when moved here saw Dr. Rudene Christians.  She called to make appointment, but needs a new referral yearly to see them.  Can not wear splints at night due to discomfort.  Is noticing worsening and weakness  presenting.  Can not brush own hair at this time. Duration: months Involved hand/wrist: Bilateral Pain: yes Severity: 6/10 Quality: dull, aching, burning, and throbbing Frequency: constant Radiation:  no Onset: gradual Paresthesias: yes Weakness: yes Mechanism of injury: no trauma Location: dorsal Aggravating factors: any movement and sleeping Alleviating factors: rest, ice, heat, shaking hands, NSAIDs, and APAP Status: worse Treatments attempted: rest, ice, heat, NSAIDs, and APAP Relief with NSAIDs?: no EMG/NCT testing: yes   Relevant past medical, surgical, family and social history reviewed and updated as indicated. Interim medical history since our last visit reviewed. Allergies and medications reviewed and updated.  Review of Systems  Constitutional:  Negative for  activity change, appetite change, diaphoresis, fatigue and fever.  Respiratory:  Negative for cough, chest tightness and shortness of breath.   Cardiovascular:  Negative for chest pain, palpitations and leg swelling.  Gastrointestinal: Negative.   Musculoskeletal:  Positive for arthralgias.  Neurological: Negative.   Psychiatric/Behavioral: Negative.      Per HPI unless specifically indicated above     Objective:    BP 108/75   Pulse 86   Temp 98.2 F (36.8 C) (Oral)   Ht '5\' 7"'  (1.702 m)   Wt 275 lb 3.2 oz (124.8 kg)   SpO2 96%   BMI 43.10 kg/m   Wt Readings from Last 3 Encounters:  02/25/22 275 lb 3.2 oz (124.8 kg)  02/07/22 275 lb (124.7 kg)  01/14/22 275 lb 9.6 oz (125 kg)    Physical Exam Vitals and nursing note reviewed.  Constitutional:      General: She is awake. She is not in acute distress.    Appearance: She is well-developed and well-groomed. She is obese. She is not ill-appearing or toxic-appearing.  HENT:     Head: Normocephalic.     Right Ear: Hearing normal.     Left Ear: Hearing normal.     Nose: Nose normal.     Mouth/Throat:     Mouth: Mucous membranes are moist.  Eyes:     General: Lids are normal.        Right eye: No discharge.  Left eye: No discharge.     Conjunctiva/sclera: Conjunctivae normal.     Pupils: Pupils are equal, round, and reactive to light.  Neck:     Thyroid: No thyromegaly.     Vascular: No carotid bruit or JVD.  Cardiovascular:     Rate and Rhythm: Normal rate and regular rhythm.     Heart sounds: Normal heart sounds. No murmur heard.    No gallop.  Pulmonary:     Effort: Pulmonary effort is normal.     Breath sounds: Normal breath sounds.  Abdominal:     General: Bowel sounds are normal.     Palpations: Abdomen is soft.  Musculoskeletal:     Right wrist: Tenderness present. No swelling or crepitus. Decreased range of motion.     Left wrist: Tenderness present. No swelling or crepitus. Decreased range of motion.      Cervical back: Normal range of motion and neck supple.     Right lower leg: No edema.     Left lower leg: No edema.     Comments: Positive Phalen and Tinel bilaterally.  Lymphadenopathy:     Cervical: No cervical adenopathy.  Skin:    General: Skin is warm and dry.  Neurological:     Mental Status: She is alert and oriented to person, place, and time.  Psychiatric:        Attention and Perception: Attention normal.        Mood and Affect: Mood normal.        Behavior: Behavior normal. Behavior is cooperative.        Thought Content: Thought content normal.        Judgment: Judgment normal.    Results for orders placed or performed in visit on 01/14/22  Comp Met (CMET)  Result Value Ref Range   Glucose 93 70 - 99 mg/dL   BUN 15 6 - 20 mg/dL   Creatinine, Ser 0.66 0.57 - 1.00 mg/dL   eGFR 115 >59 mL/min/1.73   BUN/Creatinine Ratio 23 9 - 23   Sodium 142 134 - 144 mmol/L   Potassium 3.9 3.5 - 5.2 mmol/L   Chloride 106 96 - 106 mmol/L   CO2 20 20 - 29 mmol/L   Calcium 9.2 8.7 - 10.2 mg/dL   Total Protein 6.6 6.0 - 8.5 g/dL   Albumin 4.0 3.9 - 4.9 g/dL   Globulin, Total 2.6 1.5 - 4.5 g/dL   Albumin/Globulin Ratio 1.5 1.2 - 2.2   Bilirubin Total 0.4 0.0 - 1.2 mg/dL   Alkaline Phosphatase 82 44 - 121 IU/L   AST 15 0 - 40 IU/L   ALT 14 0 - 32 IU/L  TSH  Result Value Ref Range   TSH 3.490 0.450 - 4.500 uIU/mL  T4, free  Result Value Ref Range   Free T4 1.07 0.82 - 1.77 ng/dL      Assessment & Plan:   Problem List Items Addressed This Visit       Nervous and Auditory   Bilateral carpal tunnel syndrome - Primary    Chronic issue with current worsening symptoms.  New referral to ortho placed, Dr. Rudene Christians, and will start Prednisone taper while waiting to get into ortho which may offer some benefit.  Recommend she continue at home therapy at this time.      Relevant Orders   Ambulatory referral to Orthopedics     Follow up plan: Return if symptoms worsen or fail to  improve.

## 2022-03-04 DIAGNOSIS — R200 Anesthesia of skin: Secondary | ICD-10-CM | POA: Diagnosis not present

## 2022-03-04 DIAGNOSIS — M79642 Pain in left hand: Secondary | ICD-10-CM | POA: Diagnosis not present

## 2022-03-04 DIAGNOSIS — M79641 Pain in right hand: Secondary | ICD-10-CM | POA: Diagnosis not present

## 2022-03-04 DIAGNOSIS — M25531 Pain in right wrist: Secondary | ICD-10-CM | POA: Diagnosis not present

## 2022-03-04 DIAGNOSIS — M25532 Pain in left wrist: Secondary | ICD-10-CM | POA: Diagnosis not present

## 2022-03-10 DIAGNOSIS — Z419 Encounter for procedure for purposes other than remedying health state, unspecified: Secondary | ICD-10-CM | POA: Diagnosis not present

## 2022-03-11 ENCOUNTER — Ambulatory Visit: Payer: Medicaid Other | Admitting: Dermatology

## 2022-03-13 ENCOUNTER — Ambulatory Visit: Payer: Medicaid Other | Admitting: Dermatology

## 2022-03-13 VITALS — Wt 275.0 lb

## 2022-03-13 DIAGNOSIS — L853 Xerosis cutis: Secondary | ICD-10-CM | POA: Diagnosis not present

## 2022-03-13 DIAGNOSIS — L2089 Other atopic dermatitis: Secondary | ICD-10-CM

## 2022-03-13 DIAGNOSIS — L732 Hidradenitis suppurativa: Secondary | ICD-10-CM

## 2022-03-13 DIAGNOSIS — Z79899 Other long term (current) drug therapy: Secondary | ICD-10-CM

## 2022-03-13 DIAGNOSIS — L308 Other specified dermatitis: Secondary | ICD-10-CM

## 2022-03-13 DIAGNOSIS — B078 Other viral warts: Secondary | ICD-10-CM | POA: Diagnosis not present

## 2022-03-13 DIAGNOSIS — K130 Diseases of lips: Secondary | ICD-10-CM

## 2022-03-13 MED ORDER — ISOTRETINOIN 40 MG PO CAPS: 40.0000 mg | ORAL_CAPSULE | Freq: Every day | ORAL | 0 refills | Status: DC

## 2022-03-13 MED ORDER — TRIAMCINOLONE ACETONIDE 0.1 % EX CREA: 1.0000 | TOPICAL_CREAM | Freq: Two times a day (BID) | CUTANEOUS | 1 refills | Status: AC

## 2022-03-13 NOTE — Progress Notes (Signed)
[facility]  Isotretinoin Follow-Up Visit   Subjective  Megan Bennett is a 36 y.o. female who presents for the following: Follow-up (Isotretinoin for HS patient taking 30 mg daily with a good response, ), Rash (Check a itchy rash on her feet and lower legs for several months using otc lotions with a poor response.), and Warts (Recheck a wart on her left foot treated with LN2 4 months ago with a poor response.).  Week # 8   Isotretinoin F/U - 03/13/22 1300       Isotretinoin Follow Up   Date 03/13/22    Weight 275 lb (124.7 kg)    Two Forms of Birth Control Tubal Sterilization;Female Condom      Dosage   Target Dosage (mg) 12458    Current (To Date) Dosage (mg) 1500    To Go Dosage (mg) 23440      Side Effects   Skin Dry Lips;Dry Skin    Gastrointestinal WNL    Neurological WNL             Side effects: Dry skin, dry lips  Denies changes in night vision, shortness of breath, abdominal pain, nausea, vomiting, diarrhea, blood in stool or urine, visual changes, headaches, epistaxis, joint pain, myalgias, mood changes, depression, or suicidal ideation.   Patient is not pregnant, not seeking pregnancy, and not breastfeeding.  The following portions of the chart were reviewed this encounter and updated as appropriate: medications, allergies, medical history  Review of Systems:  No other skin or systemic complaints except as noted in HPI or Assessment and Plan.  Objective  Well appearing patient in no apparent distress; mood and affect are within normal limits.  An examination of the face, neck, chest, and back was performed and relevant findings are noted below.   left ant lateral sole 0.5 cm Verrucous papule-- Discussed viral etiology and contagion.   lower legs and feet Scaly erythematous papules and patches +/- dyspigmentation, lichenification, excoriations.   groin, thighs Scarring and hyperpigmented papules    Assessment & Plan   Other viral warts left ant  lateral sole  Discussed viral etiology and risk of spread.  Discussed multiple treatments may be required to clear warts.  Discussed possible post-treatment dyspigmentation and risk of recurrence.   Destruction of lesion - left ant lateral sole Complexity: simple   Destruction method: cryotherapy   Informed consent: discussed and consent obtained   Timeout:  patient name, date of birth, surgical site, and procedure verified Lesion destroyed using liquid nitrogen: Yes   Region frozen until ice ball extended beyond lesion: Yes   Outcome: patient tolerated procedure well with no complications   Post-procedure details: wound care instructions given    Other eczema lower legs and feet  Atopic dermatitis (eczema) is a chronic, relapsing, pruritic condition that can significantly affect quality of life. It is often associated with allergic rhinitis and/or asthma and can require treatment with topical medications, phototherapy, or in severe cases biologic injectable medication (Dupixent; Adbry) or Oral JAK inhibitors.   Start Triamcinolone 0.1% cream apply to affected skin qd-bid prn, Avoid applying to face, groin, and axilla. Use as directed. Long-term use can cause thinning of the skin.   Topical steroids (such as triamcinolone, fluocinolone, fluocinonide, mometasone, clobetasol, halobetasol, betamethasone, hydrocortisone) can cause thinning and lightening of the skin if they are used for too long in the same area. Your physician has selected the right strength medicine for your problem and area affected on the body. Please use  your medication only as directed by your physician to prevent side effects.    Related Medications triamcinolone cream (KENALOG) 0.1 % Apply 1 Application topically 2 (two) times daily. Apply to affected areas twice daily. Avoid applying to face, groin, and axilla. Use as directed. Long-term use can cause thinning of the skin.  Hidradenitis suppurativa groin,  thighs  Hidradenitis Suppurativa is a chronic; persistent; non-curable, but treatable condition due to abnormal inflamed sweat glands in the body folds (axilla, inframammary, groin, medial thighs), causing recurrent painful draining cysts and scarring. It can be associated with severe scarring acne and cysts; also abscesses and scarring of scalp. The goal is control and prevention of flares, as it is not curable. Scars are permanent and can be thickened. Treatment may include daily use of topical medication and oral antibiotics.  Oral isotretinoin may also be helpful.  For more severe cases, Humira (a biologic injection) may be prescribed to decrease the inflammatory process and prevent flares.  When indicated, inflamed cysts may also be treated surgically.   Increase to Isotretinoin 40 mg once a day    Reviewed potential side effects of isotretinoin including xerosis, cheilitis, hepatitis, hyperlipidemia, and severe birth defects if taken by a pregnant woman. Reviewed reports of suicidal ideation in those with a history of depression while taking isotretinoin and reports of diagnosis of inflammatory bowl disease while taking isotretinoin as well as the lack of evidence for a causal relationship between isotretinoin, depression and IBD. Patient advised to reach out with any questions or concerns. Patient advised not to share pills or donate blood while on treatment or for one month after completing treatment.   Urine pregnancy test performed in office today and was negative.  Patient demonstrates comprehension and confirms she will not get pregnant.     Total dose 1,500 mg  Total mg/kg dose 12 mg/kg   Ipledge# [id number] [facility] in [place], [place]   Two methods of BC include tubal ligation and female latex condom   Related Medications ISOtretinoin (ACCUTANE) 40 MG capsule Take 1 capsule (40 mg total) by mouth daily.  Xerosis secondary to isotretinoin therapy - Continue emollients as  directed  Cheilitis secondary to isotretinoin therapy - Continue lip balm as directed, Dr. Luvenia Heller Cortibalm recommended  Long term medication management (isotretinoin) - While taking Isotretinoin and for 30 days after you finish the medication, do not get pregnant, do not share pills, do not donate blood. Isotretinoin is best absorbed when taken with a fatty meal. Isotretinoin can make you sensitive to the sun. Daily careful sun protection including sunscreen SPF 30+ when outdoors is recommended.  Follow-up in 30 days.  IMarye Round, CMA, am acting as scribe for Sarina Ser, MD .  Documentation: I have reviewed the above documentation for accuracy and completeness, and I agree with the above.  Sarina Ser, MD

## 2022-03-13 NOTE — Patient Instructions (Signed)
Due to recent changes in healthcare laws, you may see results of your pathology and/or laboratory studies on MyChart before the doctors have had a chance to review them. We understand that in some cases there may be results that are confusing or concerning to you. Please understand that not all results are received at the same time and often the doctors may need to interpret multiple results in order to provide you with the best plan of care or course of treatment. Therefore, we ask that you please give us 2 business days to thoroughly review all your results before contacting the office for clarification. Should we see a critical lab result, you will be contacted sooner.   If You Need Anything After Your Visit  If you have any questions or concerns for your doctor, please call our main line at [phone] and press option 4 to reach your doctor's medical assistant. If no one answers, please leave a voicemail as directed and we will return your call as soon as possible. Messages left after 4 pm will be answered the following business day.   You may also send us a message via MyChart. We typically respond to MyChart messages within 1-2 business days.  For prescription refills, please ask your pharmacy to contact our office. Our fax number is [phone].  If you have an urgent issue when the clinic is closed that cannot wait until the next business day, you can page your doctor at the number below.    Please note that while we do our best to be available for urgent issues outside of office hours, we are not available 24/7.   If you have an urgent issue and are unable to reach us, you may choose to seek medical care at your doctor's office, retail clinic, urgent care center, or emergency room.  If you have a medical emergency, please immediately call 911 or go to the emergency department.  Pager Numbers  - Dr. Kowalski: [phone]  - Dr. Moye: [phone]  - Dr. Stewart:  [phone]  In the event of inclement weather, please call our main line at [phone] for an update on the status of any delays or closures.  Dermatology Medication Tips: Please keep the boxes that topical medications come in in order to help keep track of the instructions about where and how to use these. Pharmacies typically print the medication instructions only on the boxes and not directly on the medication tubes.   If your medication is too expensive, please contact our office at [phone] option 4 or send us a message through MyChart.   We are unable to tell what your co-pay for medications will be in advance as this is different depending on your insurance coverage. However, we may be able to find a substitute medication at lower cost or fill out paperwork to get insurance to cover a needed medication.   If a prior authorization is required to get your medication covered by your insurance company, please allow us 1-2 business days to complete this process.  Drug prices often vary depending on where the prescription is filled and some pharmacies may offer cheaper prices.  The website www.goodrx.com contains coupons for medications through different pharmacies. The prices here do not account for what the cost may be with help from insurance (it may be cheaper with your insurance), but the website can give you the price if you did not use any insurance.  - You can print the associated coupon and take it with   your prescription to the pharmacy.  - You may also stop by our office during regular business hours and pick up a GoodRx coupon card.  - If you need your prescription sent electronically to a different pharmacy, notify our office through [facility] MyChart or by phone at [phone] option 4.     Si Usted Necesita Algo Despus de Su Visita  Tambin puede enviarnos un mensaje a travs de MyChart. Por lo general respondemos a los mensajes de MyChart en el transcurso de 1 a 2  das hbiles.  Para renovar recetas, por favor pida a su farmacia que se ponga en contacto con nuestra oficina. Nuestro nmero de fax es el [phone].  Si tiene un asunto urgente cuando la clnica est cerrada y que no puede esperar hasta el siguiente da hbil, puede llamar/localizar a su doctor(a) al nmero que aparece a continuacin.   Por favor, tenga en cuenta que aunque hacemos todo lo posible para estar disponibles para asuntos urgentes fuera del horario de oficina, no estamos disponibles las 24 horas del da, los 7 das de la semana.   Si tiene un problema urgente y no puede comunicarse con nosotros, puede optar por buscar atencin mdica  en el consultorio de su doctor(a), en una clnica privada, en un centro de atencin urgente o en una sala de emergencias.  Si tiene una emergencia mdica, por favor llame inmediatamente al 911 o vaya a la sala de emergencias.  Nmeros de bper  - Dr. Kowalski: [phone]  - Dra. Moye: [phone]  - Dra. Stewart: [phone]  En caso de inclemencias del tiempo, por favor llame a nuestra lnea principal al [phone] para una actualizacin sobre el estado de cualquier retraso o cierre.  Consejos para la medicacin en dermatologa: Por favor, guarde las cajas en las que vienen los medicamentos de uso tpico para ayudarle a seguir las instrucciones sobre dnde y cmo usarlos. Las farmacias generalmente imprimen las instrucciones del medicamento slo en las cajas y no directamente en los tubos del medicamento.   Si su medicamento es muy caro, por favor, pngase en contacto con nuestra oficina llamando al [phone] y presione la opcin 4 o envenos un mensaje a travs de MyChart.   No podemos decirle cul ser su copago por los medicamentos por adelantado ya que esto es diferente dependiendo de la cobertura de su seguro. Sin embargo, es posible que podamos encontrar un medicamento sustituto a menor costo o llenar un formulario para que el  seguro cubra el medicamento que se considera necesario.   Si se requiere una autorizacin previa para que su compaa de seguros cubra su medicamento, por favor permtanos de 1 a 2 das hbiles para completar este proceso.  Los precios de los medicamentos varan con frecuencia dependiendo del lugar de dnde se surte la receta y alguna farmacias pueden ofrecer precios ms baratos.  El sitio web www.goodrx.com tiene cupones para medicamentos de diferentes farmacias. Los precios aqu no tienen en cuenta lo que podra costar con la ayuda del seguro (puede ser ms barato con su seguro), pero el sitio web puede darle el precio si no utiliz ningn seguro.  - Puede imprimir el cupn correspondiente y llevarlo con su receta a la farmacia.  - Tambin puede pasar por nuestra oficina durante el horario de atencin regular y recoger una tarjeta de cupones de GoodRx.  - Si necesita que su receta se enve electrnicamente a una farmacia diferente, informe a nuestra oficina a travs de MyChart de [place]   o por telfono llamando al [phone] y presione la opcin 4.  

## 2022-03-16 ENCOUNTER — Other Ambulatory Visit: Payer: Self-pay | Admitting: Nurse Practitioner

## 2022-03-18 NOTE — Telephone Encounter (Signed)
Requested Prescriptions  Pending Prescriptions Disp Refills  . levothyroxine (SYNTHROID) 25 MCG tablet [Pharmacy Med Name: LEVOTHYROXINE 0.'025MG'$  (25MCG) TAB] 90 tablet 3    Sig: TAKE 1 TABLET(25 MCG) BY MOUTH DAILY     Endocrinology:  Hypothyroid Agents Passed - 03/16/2022  8:53 AM      Passed - TSH in normal range and within 360 days    TSH  Date Value Ref Range Status  01/14/2022 3.490 0.450 - 4.500 uIU/mL Final         Passed - Valid encounter within last 12 months    Recent Outpatient Visits          3 weeks ago Bilateral carpal tunnel syndrome   [facility] Marnee Guarneri T, NP   2 months ago [place], Karen, NP   5 months ago [place], Karen, NP   6 months ago [place], Karen, NP   7 months ago [place], Karen, NP      Future Appointments            In 1 month Nehemiah Massed Monia Sabal, MD [place]   In 4 months Jon Billings, NP [facility] [place], [place]

## 2022-03-19 ENCOUNTER — Encounter: Payer: Self-pay | Admitting: Dermatology

## 2022-04-10 DIAGNOSIS — Z419 Encounter for procedure for purposes other than remedying health state, unspecified: Secondary | ICD-10-CM | POA: Diagnosis not present

## 2022-04-12 ENCOUNTER — Other Ambulatory Visit (HOSPITAL_BASED_OUTPATIENT_CLINIC_OR_DEPARTMENT_OTHER): Payer: Self-pay

## 2022-04-12 ENCOUNTER — Other Ambulatory Visit: Payer: Self-pay

## 2022-04-12 MED ORDER — DULOXETINE HCL 60 MG PO CPEP: 60.0000 mg | ORAL_CAPSULE | Freq: Every day | ORAL | 1 refills | Status: DC

## 2022-04-12 NOTE — Telephone Encounter (Signed)
Medication refill request for Duloxetine DR 40 MG, last filled on 01/13/2022 #90. Last seen on 01/14/2022, next OV scheduled 07/22/2022. [place] advise.

## 2022-04-22 ENCOUNTER — Ambulatory Visit: Payer: Medicaid Other | Admitting: Dermatology

## 2022-04-22 VITALS — Wt 275.0 lb

## 2022-04-22 DIAGNOSIS — Z79899 Other long term (current) drug therapy: Secondary | ICD-10-CM

## 2022-04-22 DIAGNOSIS — K130 Diseases of lips: Secondary | ICD-10-CM | POA: Diagnosis not present

## 2022-04-22 DIAGNOSIS — L853 Xerosis cutis: Secondary | ICD-10-CM

## 2022-04-22 DIAGNOSIS — L732 Hidradenitis suppurativa: Secondary | ICD-10-CM

## 2022-04-22 MED ORDER — ISOTRETINOIN 30 MG PO CAPS: 30.0000 mg | ORAL_CAPSULE | Freq: Two times a day (BID) | ORAL | 0 refills | Status: DC

## 2022-04-22 NOTE — Patient Instructions (Signed)
Due to recent changes in healthcare laws, you may see results of your pathology and/or laboratory studies on MyChart before the doctors have had a chance to review them. We understand that in some cases there may be results that are confusing or concerning to you. Please understand that not all results are received at the same time and often the doctors may need to interpret multiple results in order to provide you with the best plan of care or course of treatment. Therefore, we ask that you please give us 2 business days to thoroughly review all your results before contacting the office for clarification. Should we see a critical lab result, you will be contacted sooner.   If You Need Anything After Your Visit  If you have any questions or concerns for your doctor, please call our main line at [phone] and press option 4 to reach your doctor's medical assistant. If no one answers, please leave a voicemail as directed and we will return your call as soon as possible. Messages left after 4 pm will be answered the following business day.   You may also send us a message via MyChart. We typically respond to MyChart messages within 1-2 business days.  For prescription refills, please ask your pharmacy to contact our office. Our fax number is [phone].  If you have an urgent issue when the clinic is closed that cannot wait until the next business day, you can page your doctor at the number below.    Please note that while we do our best to be available for urgent issues outside of office hours, we are not available 24/7.   If you have an urgent issue and are unable to reach us, you may choose to seek medical care at your doctor's office, retail clinic, urgent care center, or emergency room.  If you have a medical emergency, please immediately call 911 or go to the emergency department.  Pager Numbers  - Dr. Kowalski: [phone]  - Dr. Moye: [phone]  - Dr. Stewart:  [phone]  In the event of inclement weather, please call our main line at [phone] for an update on the status of any delays or closures.  Dermatology Medication Tips: Please keep the boxes that topical medications come in in order to help keep track of the instructions about where and how to use these. Pharmacies typically print the medication instructions only on the boxes and not directly on the medication tubes.   If your medication is too expensive, please contact our office at [phone] option 4 or send us a message through MyChart.   We are unable to tell what your co-pay for medications will be in advance as this is different depending on your insurance coverage. However, we may be able to find a substitute medication at lower cost or fill out paperwork to get insurance to cover a needed medication.   If a prior authorization is required to get your medication covered by your insurance company, please allow us 1-2 business days to complete this process.  Drug prices often vary depending on where the prescription is filled and some pharmacies may offer cheaper prices.  The website www.goodrx.com contains coupons for medications through different pharmacies. The prices here do not account for what the cost may be with help from insurance (it may be cheaper with your insurance), but the website can give you the price if you did not use any insurance.  - You can print the associated coupon and take it with   your prescription to the pharmacy.  - You may also stop by our office during regular business hours and pick up a GoodRx coupon card.  - If you need your prescription sent electronically to a different pharmacy, notify our office through [facility] MyChart or by phone at [phone] option 4.     Si Usted Necesita Algo Despus de Su Visita  Tambin puede enviarnos un mensaje a travs de MyChart. Por lo general respondemos a los mensajes de MyChart en el transcurso de 1 a 2  das hbiles.  Para renovar recetas, por favor pida a su farmacia que se ponga en contacto con nuestra oficina. Nuestro nmero de fax es el [phone].  Si tiene un asunto urgente cuando la clnica est cerrada y que no puede esperar hasta el siguiente da hbil, puede llamar/localizar a su doctor(a) al nmero que aparece a continuacin.   Por favor, tenga en cuenta que aunque hacemos todo lo posible para estar disponibles para asuntos urgentes fuera del horario de oficina, no estamos disponibles las 24 horas del da, los 7 das de la semana.   Si tiene un problema urgente y no puede comunicarse con nosotros, puede optar por buscar atencin mdica  en el consultorio de su doctor(a), en una clnica privada, en un centro de atencin urgente o en una sala de emergencias.  Si tiene una emergencia mdica, por favor llame inmediatamente al 911 o vaya a la sala de emergencias.  Nmeros de bper  - Dr. Kowalski: [phone]  - Dra. Moye: [phone]  - Dra. Stewart: [phone]  En caso de inclemencias del tiempo, por favor llame a nuestra lnea principal al [phone] para una actualizacin sobre el estado de cualquier retraso o cierre.  Consejos para la medicacin en dermatologa: Por favor, guarde las cajas en las que vienen los medicamentos de uso tpico para ayudarle a seguir las instrucciones sobre dnde y cmo usarlos. Las farmacias generalmente imprimen las instrucciones del medicamento slo en las cajas y no directamente en los tubos del medicamento.   Si su medicamento es muy caro, por favor, pngase en contacto con nuestra oficina llamando al [phone] y presione la opcin 4 o envenos un mensaje a travs de MyChart.   No podemos decirle cul ser su copago por los medicamentos por adelantado ya que esto es diferente dependiendo de la cobertura de su seguro. Sin embargo, es posible que podamos encontrar un medicamento sustituto a menor costo o llenar un formulario para que el  seguro cubra el medicamento que se considera necesario.   Si se requiere una autorizacin previa para que su compaa de seguros cubra su medicamento, por favor permtanos de 1 a 2 das hbiles para completar este proceso.  Los precios de los medicamentos varan con frecuencia dependiendo del lugar de dnde se surte la receta y alguna farmacias pueden ofrecer precios ms baratos.  El sitio web www.goodrx.com tiene cupones para medicamentos de diferentes farmacias. Los precios aqu no tienen en cuenta lo que podra costar con la ayuda del seguro (puede ser ms barato con su seguro), pero el sitio web puede darle el precio si no utiliz ningn seguro.  - Puede imprimir el cupn correspondiente y llevarlo con su receta a la farmacia.  - Tambin puede pasar por nuestra oficina durante el horario de atencin regular y recoger una tarjeta de cupones de GoodRx.  - Si necesita que su receta se enve electrnicamente a una farmacia diferente, informe a nuestra oficina a travs de MyChart de [place]   o por telfono llamando al [phone] y presione la opcin 4.  

## 2022-04-22 NOTE — Progress Notes (Signed)
Isotretinoin Follow-Up Visit   Subjective  Megan Bennett is a 36 y.o. female who presents for the following: Follow-up. Isotretinoin f/u for Hidradenitis Suppurativa taking Isotretinoin 40 mg daily with a good response, no new boils.  Patient c/o dry lips and dry skin   Week # 12   Isotretinoin F/U - 04/22/22 1700       Isotretinoin Follow Up   Date 04/22/22    Weight 275 lb (124.7 kg)    Two Forms of Birth Control Tubal Sterilization;Female Condom      Dosage   Target Dosage (mg) 24940    Current (To Date) Dosage (mg) 2700    To Go Dosage (mg) 22240      Side Effects   Skin Dry Lips;Dry Skin    Gastrointestinal WNL    Neurological WNL             Side effects: Dry skin, dry lips  Denies changes in night vision, shortness of breath, abdominal pain, nausea, vomiting, diarrhea, blood in stool or urine, visual changes, headaches, epistaxis, joint pain, myalgias, mood changes, depression, or suicidal ideation.   Patient is not pregnant, not seeking pregnancy, and not breastfeeding.  The following portions of the chart were reviewed this encounter and updated as appropriate: medications, allergies, medical history  Review of Systems:  No other skin or systemic complaints except as noted in HPI or Assessment and Plan.  Objective  Well appearing patient in no apparent distress; mood and affect are within normal limits.  An examination of the face, neck, chest, and back was performed and relevant findings are noted below.   axilllae, groin, thighs Scarring and hyperpigmented papules     Assessment & Plan   Hidradenitis suppurativa axilllae, groin, thighs  Hidradenitis Suppurativa is a chronic; persistent; non-curable, but treatable condition due to abnormal inflamed sweat glands in the body folds (axilla, inframammary, groin, medial thighs), causing recurrent painful draining cysts and scarring. It can be associated with severe scarring acne and cysts; also  abscesses and scarring of scalp. The goal is control and prevention of flares, as it is not curable. Scars are permanent and can be thickened. Treatment may include daily use of topical medication and oral antibiotics.  Oral isotretinoin may also be helpful.  For more severe cases, Humira (a biologic injection) may be prescribed to decrease the inflammatory process and prevent flares.  When indicated, inflamed cysts may also be treated surgically.   Increase to Isotretinoin 60 mg once a day    Reviewed potential side effects of isotretinoin including xerosis, cheilitis, hepatitis, hyperlipidemia, and severe birth defects if taken by a pregnant woman. Reviewed reports of suicidal ideation in those with a history of depression while taking isotretinoin and reports of diagnosis of inflammatory bowl disease while taking isotretinoin as well as the lack of evidence for a causal relationship between isotretinoin, depression and IBD. Patient advised to reach out with any questions or concerns. Patient advised not to share pills or donate blood while on treatment or for one month after completing treatment.   Urine pregnancy test performed in office today and was negative.  Patient demonstrates comprehension and confirms she will not get pregnant.     Total dose  2700 mg  Total mg/kg dose 22 mg/kg   Ipledge# [id number] [facility] in [place], [place]    Two methods of BC include tubal ligation and female latex condom     Related Medications ISOtretinoin (ACCUTANE) 30 MG capsule Take 1 capsule (30  mg total) by mouth 2 (two) times daily.   Xerosis secondary to isotretinoin therapy - Continue emollients as directed - Xyzal (levocetirizine) once a day and fish oil 1 gram daily may also help with dryness  Cheilitis secondary to isotretinoin therapy - Continue lip balm as directed, Dr. Luvenia Heller Cortibalm recommended  Long term medication management (isotretinoin) - While taking Isotretinoin and for 30 days after  you finish the medication, do not get pregnant, do not share pills, do not donate blood. Isotretinoin is best absorbed when taken with a fatty meal. Isotretinoin can make you sensitive to the sun. Daily careful sun protection including sunscreen SPF 30+ when outdoors is recommended.  Follow-up in 30 days.  IMarye Round, CMA, am acting as scribe for Sarina Ser, MD .  Documentation: I have reviewed the above documentation for accuracy and completeness, and I agree with the above.  Sarina Ser, MD

## 2022-04-27 ENCOUNTER — Encounter: Payer: Self-pay | Admitting: Dermatology

## 2022-05-04 ENCOUNTER — Other Ambulatory Visit: Payer: Self-pay | Admitting: Nurse Practitioner

## 2022-05-07 NOTE — Telephone Encounter (Signed)
Requested medication (s) are due for refill today: yes  Requested medication (s) are on the active medication list: no  Last refill:  01/08/22 18 g 1 RF  Future visit scheduled: yes  Notes to clinic:  Previously ordered as ProAir- even though same med sending back to approve   Requested Prescriptions  Pending Prescriptions Disp Refills   VENTOLIN HFA 108 (90 Base) MCG/ACT inhaler [Pharmacy Med Name: VENTOLIN HFA INH W/DOS CTR 200PUFFS] 18 g 1    Sig: INHALE 2 PUFFS INTO THE LUNGS EVERY 6 HOURS AS NEEDED FOR WHEEZING OR SHORTNESS OF BREATH     Pulmonology:  Beta Agonists 2 Passed - 05/04/2022  9:12 AM      Passed - Last BP in normal range    BP Readings from Last 1 Encounters:  02/25/22 108/75         Passed - Last Heart Rate in normal range    Pulse Readings from Last 1 Encounters:  02/25/22 86         Passed - Valid encounter within last 12 months    Recent Outpatient Visits           2 months ago Bilateral carpal tunnel syndrome   [facility] Venita Lick, NP   3 months ago [place], NP   6 months ago [place], NP   7 months ago [place], NP   8 months ago [place] Jon Billings, NP       Future Appointments             In 1 week Ralene Bathe, MD [place]   In 2 months Jon Billings, NP [facility], [place]

## 2022-05-10 DIAGNOSIS — Z419 Encounter for procedure for purposes other than remedying health state, unspecified: Secondary | ICD-10-CM | POA: Diagnosis not present

## 2022-05-20 ENCOUNTER — Ambulatory Visit: Payer: Medicaid Other | Admitting: Dermatology

## 2022-05-20 VITALS — Wt 275.0 lb

## 2022-05-20 DIAGNOSIS — L732 Hidradenitis suppurativa: Secondary | ICD-10-CM | POA: Diagnosis not present

## 2022-05-20 DIAGNOSIS — Z79899 Other long term (current) drug therapy: Secondary | ICD-10-CM

## 2022-05-20 DIAGNOSIS — K130 Diseases of lips: Secondary | ICD-10-CM

## 2022-05-20 DIAGNOSIS — L853 Xerosis cutis: Secondary | ICD-10-CM

## 2022-05-20 MED ORDER — ISOTRETINOIN 30 MG PO CAPS: 30.0000 mg | ORAL_CAPSULE | Freq: Two times a day (BID) | ORAL | 0 refills | Status: DC

## 2022-05-20 NOTE — Patient Instructions (Signed)
Acne is Severe; chronic and persistent; not at goal. Patient is on Isotretinoin -  requiring FDA mandated monthly evaluations and laboratory monitoring.  While taking Isotretinoin and for 30 days after you finish the medication, do not get pregnant, do not share pills, do not donate blood.  Generic isotretinoin is best absorbed when taken with a fatty meal. Isotretinoin can make you sensitive to the sun. Daily careful sun protection including sunscreen SPF 30+ when outdoors is recommended.   Due to recent changes in healthcare laws, you may see results of your pathology and/or laboratory studies on MyChart before the doctors have had a chance to review them. We understand that in some cases there may be results that are confusing or concerning to you. Please understand that not all results are received at the same time and often the doctors may need to interpret multiple results in order to provide you with the best plan of care or course of treatment. Therefore, we ask that you please give us 2 business days to thoroughly review all your results before contacting the office for clarification. Should we see a critical lab result, you will be contacted sooner.   If You Need Anything After Your Visit  If you have any questions or concerns for your doctor, please call our main line at [phone] and press option 4 to reach your doctor's medical assistant. If no one answers, please leave a voicemail as directed and we will return your call as soon as possible. Messages left after 4 pm will be answered the following business day.   You may also send us a message via MyChart. We typically respond to MyChart messages within 1-2 business days.  For prescription refills, please ask your pharmacy to contact our office. Our fax number is [phone].  If you have an urgent issue when the clinic is closed that cannot wait until the next business day, you can page your doctor at the number below.    Please  note that while we do our best to be available for urgent issues outside of office hours, we are not available 24/7.   If you have an urgent issue and are unable to reach us, you may choose to seek medical care at your doctor's office, retail clinic, urgent care center, or emergency room.  If you have a medical emergency, please immediately call 911 or go to the emergency department.  Pager Numbers  - Dr. Kowalski: [phone]  - Dr. Moye: [phone]  - Dr. Stewart: [phone]  In the event of inclement weather, please call our main line at [phone] for an update on the status of any delays or closures.  Dermatology Medication Tips: Please keep the boxes that topical medications come in in order to help keep track of the instructions about where and how to use these. Pharmacies typically print the medication instructions only on the boxes and not directly on the medication tubes.   If your medication is too expensive, please contact our office at [phone] option 4 or send us a message through MyChart.   We are unable to tell what your co-pay for medications will be in advance as this is different depending on your insurance coverage. However, we may be able to find a substitute medication at lower cost or fill out paperwork to get insurance to cover a needed medication.   If a prior authorization is required to get your medication covered by your insurance company, please allow us 1-2 business days to complete   this process.  Drug prices often vary depending on where the prescription is filled and some pharmacies may offer cheaper prices.  The website www.goodrx.com contains coupons for medications through different pharmacies. The prices here do not account for what the cost may be with help from insurance (it may be cheaper with your insurance), but the website can give you the price if you did not use any insurance.  - You can print the associated coupon and take it with  your prescription to the pharmacy.  - You may also stop by our office during regular business hours and pick up a GoodRx coupon card.  - If you need your prescription sent electronically to a different pharmacy, notify our office through [place] MyChart or by phone at [phone] option 4.     Si Usted Necesita Algo Despus de Su Visita  Tambin puede enviarnos un mensaje a travs de MyChart. Por lo general respondemos a los mensajes de MyChart en el transcurso de 1 a 2 das hbiles.  Para renovar recetas, por favor pida a su farmacia que se ponga en contacto con nuestra oficina. Nuestro nmero de fax es el [phone].  Si tiene un asunto urgente cuando la clnica est cerrada y que no puede esperar hasta el siguiente da hbil, puede llamar/localizar a su doctor(a) al nmero que aparece a continuacin.   Por favor, tenga en cuenta que aunque hacemos todo lo posible para estar disponibles para asuntos urgentes fuera del horario de oficina, no estamos disponibles las 24 horas del da, los 7 das de la semana.   Si tiene un problema urgente y no puede comunicarse con nosotros, puede optar por buscar atencin mdica  en el consultorio de su doctor(a), en una clnica privada, en un centro de atencin urgente o en una sala de emergencias.  Si tiene una emergencia mdica, por favor llame inmediatamente al 911 o vaya a la sala de emergencias.  Nmeros de bper  - Dr. Kowalski: [phone]  - Dra. Moye: [phone]  - Dra. Stewart: [phone]  En caso de inclemencias del tiempo, por favor llame a nuestra lnea principal al [phone] para una actualizacin sobre el estado de cualquier retraso o cierre.  Consejos para la medicacin en dermatologa: Por favor, guarde las cajas en las que vienen los medicamentos de uso tpico para ayudarle a seguir las instrucciones sobre dnde y cmo usarlos. Las farmacias generalmente imprimen las instrucciones del medicamento slo en las cajas y  no directamente en los tubos del medicamento.   Si su medicamento es muy caro, por favor, pngase en contacto con nuestra oficina llamando al [phone] y presione la opcin 4 o envenos un mensaje a travs de MyChart.   No podemos decirle cul ser su copago por los medicamentos por adelantado ya que esto es diferente dependiendo de la cobertura de su seguro. Sin embargo, es posible que podamos encontrar un medicamento sustituto a menor costo o llenar un formulario para que el seguro cubra el medicamento que se considera necesario.   Si se requiere una autorizacin previa para que su compaa de seguros cubra su medicamento, por favor permtanos de 1 a 2 das hbiles para completar este proceso.  Los precios de los medicamentos varan con frecuencia dependiendo del lugar de dnde se surte la receta y alguna farmacias pueden ofrecer precios ms baratos.  El sitio web www.goodrx.com tiene cupones para medicamentos de diferentes farmacias. Los precios aqu no tienen en cuenta lo que podra costar con la ayuda del seguro (  puede ser ms barato con su seguro), pero el sitio web puede darle el precio si no utiliz ningn seguro.  - Puede imprimir el cupn correspondiente y llevarlo con su receta a la farmacia.  - Tambin puede pasar por nuestra oficina durante el horario de atencin regular y recoger una tarjeta de cupones de GoodRx.  - Si necesita que su receta se enve electrnicamente a una farmacia diferente, informe a nuestra oficina a travs de MyChart de [place] o por telfono llamando al [phone] y presione la opcin 4.  

## 2022-05-20 NOTE — Progress Notes (Signed)
Isotretinoin Follow-Up Visit   Subjective  Megan Bennett is a 36 y.o. female who presents for the following: Follow-up (Hidradenitis 30 day follow up - sent in Isotretinoin 30 mg 2 po qd but pharmacy gave her 40 mg 2 po qd - she has had a few breakouts in the last month/).  Week # 16   Isotretinoin F/U - 05/20/22 1500       Isotretinoin Follow Up   iPledge # [id number]    Date 05/20/22    Weight 275 lb (124.7 kg)    Two Forms of Birth Control Tubal Sterilization;Female Condom    Acne breakouts since last visit? Yes      Dosage   Target Dosage (mg) 24940    Current (To Date) Dosage (mg) 5100    To Go Dosage (mg) 19840      Side Effects   Skin Dry Lips;Dry Skin    Gastrointestinal WNL    Neurological WNL    Constitutional WNL            Side effects: Dry skin, dry lips  Denies changes in night vision, shortness of breath, abdominal pain, nausea, vomiting, diarrhea, blood in stool or urine, visual changes, headaches, epistaxis, joint pain, myalgias, mood changes, depression, or suicidal ideation.   Patient is not pregnant, not seeking pregnancy, and not breastfeeding.  The following portions of the chart were reviewed this encounter and updated as appropriate: medications, allergies, medical history  Review of Systems:  No other skin or systemic complaints except as noted in HPI or Assessment and Plan.  Objective  Well appearing patient in no apparent distress; mood and affect are within normal limits.  An examination of the face, neck, chest, and back was performed and relevant findings are noted below.    Assessment & Plan   Hidradenitis suppurativa  Hidradenitis Suppurativa is a chronic; persistent; non-curable, but treatable condition due to abnormal inflamed sweat glands in the body folds (axilla, inframammary, groin, medial thighs), causing recurrent painful draining cysts and scarring. It can be associated with severe scarring acne and cysts; also  abscesses and scarring of scalp. The goal is control and prevention of flares, as it is not curable. Scars are permanent and can be thickened. Treatment may include daily use of topical medication and oral antibiotics.  Oral isotretinoin may also be helpful.  For more severe cases, Humira (a biologic injection) may be prescribed to decrease the inflammatory process and prevent flares.  When indicated, inflamed cysts may also be treated surgically.  Decrease Isotretinoin 30 mg 2 po qd   Reviewed potential side effects of isotretinoin including xerosis, cheilitis, hepatitis, hyperlipidemia, and severe birth defects if taken by a pregnant woman. Reviewed reports of suicidal ideation in those with a history of depression while taking isotretinoin and reports of diagnosis of inflammatory bowl disease while taking isotretinoin as well as the lack of evidence for a causal relationship between isotretinoin, depression and IBD. Patient advised to reach out with any questions or concerns. Patient advised not to share pills or donate blood while on treatment or for one month after completing treatment.   Urine pregnancy test performed in office today and was negative.  Patient demonstrates comprehension and confirms she will not get pregnant.  Lot #[id number] Exp 07/31/2023   Total dose  5100 mg  Total mg/kg dose 40.9 mg/kg   Ipledge# [id number] [facility] in [place], [place]    Two methods of BC include tubal ligation and female latex condom  ISOtretinoin (ACCUTANE) 30 MG capsule Take 1 capsule (30 mg total) by mouth 2 (two) times daily.  Long-term use of high-risk medication  Medication management   Xerosis secondary to isotretinoin therapy - Continue emollients as directed - Xyzal (levocetirizine) once a day and fish oil 1 gram daily may also help with dryness  Cheilitis secondary to isotretinoin therapy - Continue lip balm as directed, Dr. Luvenia Heller Cortibalm recommended  Long term medication  management (isotretinoin) - While taking Isotretinoin and for 30 days after you finish the medication, do not get pregnant, do not share pills, do not donate blood. Isotretinoin is best absorbed when taken with a fatty meal. Isotretinoin can make you sensitive to the sun. Daily careful sun protection including sunscreen SPF 30+ when outdoors is recommended.  Follow-up in 30 days.  I, Ashok Cordia, CMA, am acting as scribe for Sarina Ser, MD . Documentation: I have reviewed the above documentation for accuracy and completeness, and I agree with the above.  Sarina Ser, MD

## 2022-05-23 ENCOUNTER — Ambulatory Visit: Payer: Medicaid Other | Admitting: Dermatology

## 2022-05-28 ENCOUNTER — Encounter: Payer: Self-pay | Admitting: Dermatology

## 2022-06-10 DIAGNOSIS — Z419 Encounter for procedure for purposes other than remedying health state, unspecified: Secondary | ICD-10-CM | POA: Diagnosis not present

## 2022-06-19 ENCOUNTER — Ambulatory Visit: Payer: Medicaid Other | Admitting: Dermatology

## 2022-06-19 VITALS — Wt 275.0 lb

## 2022-06-19 DIAGNOSIS — L732 Hidradenitis suppurativa: Secondary | ICD-10-CM

## 2022-06-19 DIAGNOSIS — Z79899 Other long term (current) drug therapy: Secondary | ICD-10-CM | POA: Diagnosis not present

## 2022-06-19 DIAGNOSIS — L853 Xerosis cutis: Secondary | ICD-10-CM | POA: Diagnosis not present

## 2022-06-19 DIAGNOSIS — K130 Diseases of lips: Secondary | ICD-10-CM

## 2022-06-19 MED ORDER — ISOTRETINOIN 30 MG PO CAPS: 30.0000 mg | ORAL_CAPSULE | Freq: Two times a day (BID) | ORAL | 0 refills | Status: DC

## 2022-06-19 NOTE — Patient Instructions (Signed)
Due to recent changes in healthcare laws, you may see results of your pathology and/or laboratory studies on MyChart before the doctors have had a chance to review them. We understand that in some cases there may be results that are confusing or concerning to you. Please understand that not all results are received at the same time and often the doctors may need to interpret multiple results in order to provide you with the best plan of care or course of treatment. Therefore, we ask that you please give us 2 business days to thoroughly review all your results before contacting the office for clarification. Should we see a critical lab result, you will be contacted sooner.   If You Need Anything After Your Visit  If you have any questions or concerns for your doctor, please call our main line at [phone] and press option 4 to reach your doctor's medical assistant. If no one answers, please leave a voicemail as directed and we will return your call as soon as possible. Messages left after 4 pm will be answered the following business day.   You may also send us a message via MyChart. We typically respond to MyChart messages within 1-2 business days.  For prescription refills, please ask your pharmacy to contact our office. Our fax number is [phone].  If you have an urgent issue when the clinic is closed that cannot wait until the next business day, you can page your doctor at the number below.    Please note that while we do our best to be available for urgent issues outside of office hours, we are not available 24/7.   If you have an urgent issue and are unable to reach us, you may choose to seek medical care at your doctor's office, retail clinic, urgent care center, or emergency room.  If you have a medical emergency, please immediately call 911 or go to the emergency department.  Pager Numbers  - Dr. Kowalski: [phone]  - Dr. Moye: [phone]  - Dr. Stewart:  [phone]  In the event of inclement weather, please call our main line at [phone] for an update on the status of any delays or closures.  Dermatology Medication Tips: Please keep the boxes that topical medications come in in order to help keep track of the instructions about where and how to use these. Pharmacies typically print the medication instructions only on the boxes and not directly on the medication tubes.   If your medication is too expensive, please contact our office at [phone] option 4 or send us a message through MyChart.   We are unable to tell what your co-pay for medications will be in advance as this is different depending on your insurance coverage. However, we may be able to find a substitute medication at lower cost or fill out paperwork to get insurance to cover a needed medication.   If a prior authorization is required to get your medication covered by your insurance company, please allow us 1-2 business days to complete this process.  Drug prices often vary depending on where the prescription is filled and some pharmacies may offer cheaper prices.  The website www.goodrx.com contains coupons for medications through different pharmacies. The prices here do not account for what the cost may be with help from insurance (it may be cheaper with your insurance), but the website can give you the price if you did not use any insurance.  - You can print the associated coupon and take it with   your prescription to the pharmacy.  - You may also stop by our office during regular business hours and pick up a GoodRx coupon card.  - If you need your prescription sent electronically to a different pharmacy, notify our office through [facility] MyChart or by phone at [phone] option 4.     Si Usted Necesita Algo Despus de Su Visita  Tambin puede enviarnos un mensaje a travs de MyChart. Por lo general respondemos a los mensajes de MyChart en el transcurso de 1 a 2  das hbiles.  Para renovar recetas, por favor pida a su farmacia que se ponga en contacto con nuestra oficina. Nuestro nmero de fax es el [phone].  Si tiene un asunto urgente cuando la clnica est cerrada y que no puede esperar hasta el siguiente da hbil, puede llamar/localizar a su doctor(a) al nmero que aparece a continuacin.   Por favor, tenga en cuenta que aunque hacemos todo lo posible para estar disponibles para asuntos urgentes fuera del horario de oficina, no estamos disponibles las 24 horas del da, los 7 das de la semana.   Si tiene un problema urgente y no puede comunicarse con nosotros, puede optar por buscar atencin mdica  en el consultorio de su doctor(a), en una clnica privada, en un centro de atencin urgente o en una sala de emergencias.  Si tiene una emergencia mdica, por favor llame inmediatamente al 911 o vaya a la sala de emergencias.  Nmeros de bper  - Dr. Kowalski: [phone]  - Dra. Moye: [phone]  - Dra. Stewart: [phone]  En caso de inclemencias del tiempo, por favor llame a nuestra lnea principal al [phone] para una actualizacin sobre el estado de cualquier retraso o cierre.  Consejos para la medicacin en dermatologa: Por favor, guarde las cajas en las que vienen los medicamentos de uso tpico para ayudarle a seguir las instrucciones sobre dnde y cmo usarlos. Las farmacias generalmente imprimen las instrucciones del medicamento slo en las cajas y no directamente en los tubos del medicamento.   Si su medicamento es muy caro, por favor, pngase en contacto con nuestra oficina llamando al [phone] y presione la opcin 4 o envenos un mensaje a travs de MyChart.   No podemos decirle cul ser su copago por los medicamentos por adelantado ya que esto es diferente dependiendo de la cobertura de su seguro. Sin embargo, es posible que podamos encontrar un medicamento sustituto a menor costo o llenar un formulario para que el  seguro cubra el medicamento que se considera necesario.   Si se requiere una autorizacin previa para que su compaa de seguros cubra su medicamento, por favor permtanos de 1 a 2 das hbiles para completar este proceso.  Los precios de los medicamentos varan con frecuencia dependiendo del lugar de dnde se surte la receta y alguna farmacias pueden ofrecer precios ms baratos.  El sitio web www.goodrx.com tiene cupones para medicamentos de diferentes farmacias. Los precios aqu no tienen en cuenta lo que podra costar con la ayuda del seguro (puede ser ms barato con su seguro), pero el sitio web puede darle el precio si no utiliz ningn seguro.  - Puede imprimir el cupn correspondiente y llevarlo con su receta a la farmacia.  - Tambin puede pasar por nuestra oficina durante el horario de atencin regular y recoger una tarjeta de cupones de GoodRx.  - Si necesita que su receta se enve electrnicamente a una farmacia diferente, informe a nuestra oficina a travs de MyChart de [place]   o por telfono llamando al [phone] y presione la opcin 4.  

## 2022-06-19 NOTE — Progress Notes (Unsigned)
Isotretinoin Follow-Up Visit   Subjective  Megan Bennett is a 37 y.o. female who presents for the following: Follow-up.  Follow-up (Hidradenitis 30 day follow up - sent in Isotretinoin 30 mg 2 po qd, patient has had 1 breakout this month, patient report she does not break out like she did before she started Isotretinoin therapy.   Week # 20   Isotretinoin F/U - 06/19/22 1600       Isotretinoin Follow Up   iPledge # [id number]    Date 06/19/22    Weight 275 lb (124.7 kg)    Two Forms of Birth Control Tubal Sterilization;Female Condom    Acne breakouts since last visit? Yes      Dosage   Target Dosage (mg) 24940    Current (To Date) Dosage (mg) 6900    To Go Dosage (mg) 18040      Side Effects   Skin Dry Skin    Gastrointestinal WNL    Neurological WNL    Constitutional WNL             Side effects: Dry skin, dry lips  Denies changes in night vision, shortness of breath, abdominal pain, nausea, vomiting, diarrhea, blood in stool or urine, visual changes, headaches, epistaxis, joint pain, myalgias, mood changes, depression, or suicidal ideation.   Patient is not pregnant, not seeking pregnancy, and not breastfeeding.  The following portions of the chart were reviewed this encounter and updated as appropriate: medications, allergies, medical history  Review of Systems:  No other skin or systemic complaints except as noted in HPI or Assessment and Plan.  Objective  Well appearing patient in no apparent distress; mood and affect are within normal limits.  An examination of the face, neck, chest, and back was performed and relevant findings are noted below.   axilllae, groin, thighs Scarring and hyperpigmented papules      Assessment & Plan   Hidradenitis suppurativa axilllae, groin, thighs  Hidradenitis suppurativa   Hidradenitis Suppurativa is a chronic; persistent; non-curable, but treatable condition due to abnormal inflamed sweat glands in the body  folds (axilla, inframammary, groin, medial thighs), causing recurrent painful draining cysts and scarring. It can be associated with severe scarring acne and cysts; also abscesses and scarring of scalp. The goal is control and prevention of flares, as it is not curable. Scars are permanent and can be thickened. Treatment may include daily use of topical medication and oral antibiotics.  Oral isotretinoin may also be helpful.  For more severe cases, Humira (a biologic injection) may be prescribed to decrease the inflammatory process and prevent flares.  When indicated, inflamed cysts may also be treated surgically.   Continue Isotretinoin 30 mg 2 po qd   Reviewed potential side effects of isotretinoin including xerosis, cheilitis, hepatitis, hyperlipidemia, and severe birth defects if taken by a pregnant woman. Reviewed reports of suicidal ideation in those with a history of depression while taking isotretinoin and reports of diagnosis of inflammatory bowl disease while taking isotretinoin as well as the lack of evidence for a causal relationship between isotretinoin, depression and IBD. Patient advised to reach out with any questions or concerns. Patient advised not to share pills or donate blood while on treatment or for one month after completing treatment.   Urine pregnancy test performed in office today and was negative.  Patient demonstrates comprehension and confirms she will not get pregnant.  Lot #[id number] Exp 07/31/2023   Total dose  6900 mg  Total mg/kg dose 55  mg/kg   Ipledge# [id number] [facility] in [place], [place]    Two methods of BC include tubal ligation and female latex condom   Labs ordered CMP, Lipid panel-  Patient will have labs drawn by her PCP 07/22/22  Related Procedures Comprehensive metabolic panel Lipid Panel  Related Medications ISOtretinoin (ACCUTANE) 30 MG capsule Take 1 capsule (30 mg total) by mouth 2 (two) times daily.   Xerosis secondary to isotretinoin  therapy - Continue emollients as directed - Xyzal (levocetirizine) once a day and fish oil 1 gram daily may also help with dryness  Cheilitis secondary to isotretinoin therapy - Continue lip balm as directed, Dr. Luvenia Heller Cortibalm recommended  Long term medication management (isotretinoin) - While taking Isotretinoin and for 30 days after you finish the medication, do not get pregnant, do not share pills, do not donate blood. Isotretinoin is best absorbed when taken with a fatty meal. Isotretinoin can make you sensitive to the sun. Daily careful sun protection including sunscreen SPF 30+ when outdoors is recommended.  Follow-up in 30 days.  IMarye Round, CMA, am acting as scribe for Sarina Ser, MD .  Documentation: I have reviewed the above documentation for accuracy and completeness, and I agree with the above.  Sarina Ser, MD

## 2022-06-20 ENCOUNTER — Other Ambulatory Visit: Payer: Self-pay

## 2022-06-20 ENCOUNTER — Telehealth: Payer: Self-pay

## 2022-06-20 ENCOUNTER — Encounter: Payer: Self-pay | Admitting: Dermatology

## 2022-06-20 NOTE — Progress Notes (Signed)
ERROR

## 2022-06-20 NOTE — Telephone Encounter (Signed)
Patient called regarding demonstrating comprehension in ipledge today. Patient is suppose to be Tubal and Female latex condoms but registered in ipledge at [facility]. Patient will answer questions as Hormonal implant and condoms so she is put on hold from program for additional 30 days. aw

## 2022-06-29 ENCOUNTER — Other Ambulatory Visit: Payer: Self-pay | Admitting: Nurse Practitioner

## 2022-07-11 DIAGNOSIS — Z419 Encounter for procedure for purposes other than remedying health state, unspecified: Secondary | ICD-10-CM | POA: Diagnosis not present

## 2022-07-12 ENCOUNTER — Other Ambulatory Visit: Payer: Self-pay | Admitting: Nurse Practitioner

## 2022-07-12 ENCOUNTER — Telehealth: Payer: Medicaid Other | Admitting: Family Medicine

## 2022-07-12 DIAGNOSIS — B9689 Other specified bacterial agents as the cause of diseases classified elsewhere: Secondary | ICD-10-CM

## 2022-07-12 DIAGNOSIS — J019 Acute sinusitis, unspecified: Secondary | ICD-10-CM

## 2022-07-12 MED ORDER — PSEUDOEPH-BROMPHEN-DM 30-2-10 MG/5ML PO SYRP: 5.0000 mL | ORAL_SOLUTION | Freq: Four times a day (QID) | ORAL | 0 refills | Status: DC | PRN

## 2022-07-12 MED ORDER — DOXYCYCLINE HYCLATE 100 MG PO TABS: 100.0000 mg | ORAL_TABLET | Freq: Two times a day (BID) | ORAL | 0 refills | Status: DC

## 2022-07-12 MED ORDER — FLUTICASONE PROPIONATE 50 MCG/ACT NA SUSP: 2.0000 | Freq: Every day | NASAL | 0 refills | Status: AC

## 2022-07-12 NOTE — Progress Notes (Signed)
Virtual Visit Consent   Megan Bennett, you are scheduled for a virtual visit with a [facility] provider today. Just as with appointments in the office, your consent must be obtained to participate. Your consent will be active for this visit and any virtual visit you may have with one of our providers in the next 365 days. If you have a MyChart account, a copy of this consent can be sent to you electronically.  As this is a virtual visit, video technology does not allow for your provider to perform a traditional examination. This may limit your provider's ability to fully assess your condition. If your provider identifies any concerns that need to be evaluated in person or the need to arrange testing (such as labs, EKG, etc.), we will make arrangements to do so. Although advances in technology are sophisticated, we cannot ensure that it will always work on either your end or our end. If the connection with a video visit is poor, the visit may have to be switched to a telephone visit. With either a video or telephone visit, we are not always able to ensure that we have a secure connection.  By engaging in this virtual visit, you consent to the provision of healthcare and authorize for your insurance to be billed (if applicable) for the services provided during this visit. Depending on your insurance coverage, you may receive a charge related to this service.  I need to obtain your verbal consent now. Are you willing to proceed with your visit today? Megan Bennett has provided verbal consent on 07/12/2022 for a virtual visit (video or telephone). [name], NP  Date: 07/12/2022 2:41 PM  Virtual Visit via Video Note   I, [name], connected with  Megan Bennett  ([id number], Aug 09, 1985) on 07/12/22 at  2:45 PM EST by a video-enabled telemedicine application and verified that I am speaking with the correct person using two identifiers.  Location: Patient: Virtual Visit  Location Patient: Home Provider: Virtual Visit Location Provider: Home Office   I discussed the limitations of evaluation and management by telemedicine and the availability of in person appointments. The patient expressed understanding and agreed to proceed.    History of Present Illness: Megan Bennett is a 37 y.o. who identifies as a female who was assigned female at birth, and is being seen today for sinus infection. Pressure in facial areas, nose congestion with limited drainage. Onset was 5-6 days ago. Reports cough, chills, ear pain, sore throat but has improved. Denies chest pain, shortness of breath, fevers. No sick contacts she is aware of.  Problems:  Patient Active Problem List   Diagnosis Date Noted   Bilateral carpal tunnel syndrome 02/25/2022   Acquired hypothyroidism 08/13/2021   Anxiety 08/13/2021   Fibromyalgia 03/19/2021   Nicotine dependence, cigarettes, uncomplicated 87/86/7672   S/P laparoscopic surgery 08/04/2018    Allergies:  Allergies  Allergen Reactions   Amoxicillin Rash    Did it involve swelling of the face/tongue/throat, SOB, or low BP? No Did it involve sudden or severe rash/hives, skin peeling, or any reaction on the inside of your mouth or nose? No Did you need to seek medical attention at a hospital or doctor's office? No When did it last happen?      14 + years If all above answers are "NO", may proceed with cephalosporin use.,    Hydrocodone Nausea And Vomiting   Wellbutrin [Bupropion] Other (See Comments)    shakey  Medications:  Current Outpatient Medications:    diphenhydrAMINE (BENADRYL) 25 mg capsule, Take 25 mg by mouth as needed for allergies., Disp: , Rfl:    DULoxetine (CYMBALTA) 60 MG capsule, Take 1 capsule (60 mg total) by mouth daily., Disp: 90 capsule, Rfl: 1   ibuprofen (ADVIL) 800 MG tablet, Take 1 tablet (800 mg total) by mouth every 8 (eight) hours as needed., Disp: 90 tablet, Rfl: 1   ISOtretinoin (ACCUTANE) 30  MG capsule, Take 1 capsule (30 mg total) by mouth 2 (two) times daily., Disp: 60 capsule, Rfl: 0   levothyroxine (SYNTHROID) 25 MCG tablet, TAKE 1 TABLET(25 MCG) BY MOUTH DAILY, Disp: 90 tablet, Rfl: 3   polyethylene glycol (MIRALAX) 17 g packet, Take 17 g by mouth daily., Disp: 14 each, Rfl: 1   predniSONE (DELTASONE) 10 MG tablet, Take 6 tablets by mouth daily for 2 days, then reduce by 1 tablet every 2 days until gone, Disp: 42 tablet, Rfl: 0   STIOLTO RESPIMAT 2.5-2.5 MCG/ACT AERS, INHALE 2 PUFFS BY MOUTH DAILY, Disp: 4 g, Rfl: 1   SUMAtriptan (IMITREX) 100 MG tablet, Take 1 tablet (100 mg total) by mouth daily as needed., Disp: 10 tablet, Rfl: 1   triamcinolone cream (KENALOG) 0.1 %, Apply 1 Application topically 2 (two) times daily. Apply to affected areas twice daily. Avoid applying to face, groin, and axilla. Use as directed. Long-term use can cause thinning of the skin., Disp: 80 g, Rfl: 1   VENTOLIN HFA 108 (90 Base) MCG/ACT inhaler, INHALE 2 PUFFS INTO THE LUNGS EVERY 6 HOURS AS NEEDED FOR WHEEZING OR SHORTNESS OF BREATH, Disp: 18 g, Rfl: 1  Observations/Objective: Patient is well-developed, well-nourished in no acute distress.  Resting comfortably  at home.  Head is normocephalic, atraumatic.  No labored breathing.  Speech is clear and coherent with logical content.  Patient is alert and oriented at baseline.    Assessment and Plan:  1. Acute bacterial sinusitis  - doxycycline (VIBRA-TABS) 100 MG tablet; Take 1 tablet (100 mg total) by mouth 2 (two) times daily for 10 days.  Dispense: 20 tablet; Refill: 0 - brompheniramine-pseudoephedrine-DM 30-2-10 MG/5ML syrup; Take 5 mLs by mouth 4 (four) times daily as needed.  Dispense: 120 mL; Refill: 0 - fluticasone (FLONASE) 50 MCG/ACT nasal spray; Place 2 sprays into both nostrils daily.  Dispense: 16 g; Refill: 0  -Take meds as prescribed -Rest -Use a cool mist humidifier especially during the winter months when heat dries out the  air. - Use saline nose sprays frequently to help soothe nasal passages and promote drainage. -Saline irrigations of the nose can be very helpful if done frequently.             * 4X daily for 1 week*             * Use of a nettie pot can be helpful with this.  *Follow directions with this* *Boiled or distilled water only -stay hydrated by drinking plenty of fluids - Keep thermostat turn down low to prevent drying out sinuses - For any cough or congestion- robitussin DM or Delsym as needed - For fever or aches or pains- take tylenol or ibuprofen as directed on bottle             * for fevers greater than 101 orally you may alternate ibuprofen and tylenol every 3 hours.  If you do not improve you will need a follow up visit in person.  Reviewed side effects, risks and benefits of medication.    Patient acknowledged agreement and understanding of the plan.   Past Medical, Surgical, Social History, Allergies, and Medications have been Reviewed.    Follow Up Instructions: I discussed the assessment and treatment plan with the patient. The patient was provided an opportunity to ask questions and all were answered. The patient agreed with the plan and demonstrated an understanding of the instructions.  A copy of instructions were sent to the patient via MyChart unless otherwise noted below.    The patient was advised to call back or seek an in-person evaluation if the symptoms worsen or if the condition fails to improve as anticipated.  Time:  I spent 10 minutes with the patient via telehealth technology discussing the above problems/concerns.    [name], NP

## 2022-07-12 NOTE — Patient Instructions (Addendum)
Megan Bennett, thank you for joining [name], NP for today's virtual visit.  While this provider is not your primary care provider (PCP), if your PCP is located in our provider database this encounter information will be shared with them immediately following your visit.   [facility] account gives you access to today's visit and all your visits, tests, and labs performed at [facility] " click here if you don't have a [facility] account or go to [email or web address]  Consent: (Patient) Megan Bennett provided verbal consent for this virtual visit at the beginning of the encounter.  Current Medications:  Current Outpatient Medications:    brompheniramine-pseudoephedrine-DM 30-2-10 MG/5ML syrup, Take 5 mLs by mouth 4 (four) times daily as needed., Disp: 120 mL, Rfl: 0   doxycycline (VIBRA-TABS) 100 MG tablet, Take 1 tablet (100 mg total) by mouth 2 (two) times daily for 10 days., Disp: 20 tablet, Rfl: 0   fluticasone (FLONASE) 50 MCG/ACT nasal spray, Place 2 sprays into both nostrils daily., Disp: 16 g, Rfl: 0   diphenhydrAMINE (BENADRYL) 25 mg capsule, Take 25 mg by mouth as needed for allergies., Disp: , Rfl:    DULoxetine (CYMBALTA) 60 MG capsule, Take 1 capsule (60 mg total) by mouth daily., Disp: 90 capsule, Rfl: 1   ibuprofen (ADVIL) 800 MG tablet, Take 1 tablet (800 mg total) by mouth every 8 (eight) hours as needed., Disp: 90 tablet, Rfl: 1   ISOtretinoin (ACCUTANE) 30 MG capsule, Take 1 capsule (30 mg total) by mouth 2 (two) times daily., Disp: 60 capsule, Rfl: 0   levothyroxine (SYNTHROID) 25 MCG tablet, TAKE 1 TABLET(25 MCG) BY MOUTH DAILY, Disp: 90 tablet, Rfl: 3   polyethylene glycol (MIRALAX) 17 g packet, Take 17 g by mouth daily., Disp: 14 each, Rfl: 1   predniSONE (DELTASONE) 10 MG tablet, Take 6 tablets by mouth daily for 2 days, then reduce by 1 tablet every 2 days until gone, Disp: 42 tablet, Rfl: 0   STIOLTO RESPIMAT  2.5-2.5 MCG/ACT AERS, INHALE 2 PUFFS BY MOUTH DAILY, Disp: 4 g, Rfl: 1   SUMAtriptan (IMITREX) 100 MG tablet, Take 1 tablet (100 mg total) by mouth daily as needed., Disp: 10 tablet, Rfl: 1   triamcinolone cream (KENALOG) 0.1 %, Apply 1 Application topically 2 (two) times daily. Apply to affected areas twice daily. Avoid applying to face, groin, and axilla. Use as directed. Long-term use can cause thinning of the skin., Disp: 80 g, Rfl: 1   VENTOLIN HFA 108 (90 Base) MCG/ACT inhaler, INHALE 2 PUFFS INTO THE LUNGS EVERY 6 HOURS AS NEEDED FOR WHEEZING OR SHORTNESS OF BREATH, Disp: 18 g, Rfl: 1   Medications ordered in this encounter:  Meds ordered this encounter  Medications   doxycycline (VIBRA-TABS) 100 MG tablet    Sig: Take 1 tablet (100 mg total) by mouth 2 (two) times daily for 10 days.    Dispense:  20 tablet    Refill:  0    Order Specific Question:   Supervising Provider    Answer:   Chase Picket [id number]   brompheniramine-pseudoephedrine-DM 30-2-10 MG/5ML syrup    Sig: Take 5 mLs by mouth 4 (four) times daily as needed.    Dispense:  120 mL    Refill:  0    Order Specific Question:   Supervising Provider    Answer:   Chase Picket [[id number]]   fluticasone (FLONASE) 50 MCG/ACT nasal spray    Sig: Place  2 sprays into both nostrils daily.    Dispense:  16 g    Refill:  0    Order Specific Question:   Supervising Provider    Answer:   Chase Picket [id number]     *If you need refills on other medications prior to your next appointment, please contact your pharmacy*  Follow-Up: Call back or seek an in-person evaluation if the symptoms worsen or if the condition fails to improve as anticipated.  [facility] [phone]  Other Instructions  -Take meds as prescribed -Rest -Use a cool mist humidifier especially during the winter months when heat dries out the air. - Use saline nose sprays frequently to help soothe nasal passages and promote  drainage. -Mucinex -Saline irrigations of the nose can be very helpful if done frequently.             * 4X daily for 1 week*             * Use of a nettie pot can be helpful with this.  *Follow directions with this* *Boiled or distilled water only -stay hydrated by drinking plenty of fluids - Keep thermostat turn down low to prevent drying out sinuses - For any cough or congestion- robitussin DM or Delsym as needed - For fever or aches or pains- take tylenol or ibuprofen as directed on bottle             * for fevers greater than 101 orally you may alternate ibuprofen and tylenol every 3 hours.  If you do not improve you will need a follow up visit in person.                   If you have been instructed to have an in-person evaluation today at a local Urgent Care facility, please use the link below. It will take you to a list of all of our available [facility], including address, phone number and hours of operation. Please do not delay care.  [facility]  If you or a family member do not have a primary care provider, use the link below to schedule a visit and establish care. When you choose a [facility] primary care physician or advanced practice provider, you gain a long-term partner in health. Find a Primary Care Provider  Learn more about [facility]'s in-office and virtual care options: [facility]

## 2022-07-12 NOTE — Telephone Encounter (Signed)
Requested Prescriptions  Pending Prescriptions Disp Refills   DULoxetine (CYMBALTA) 60 MG capsule [Pharmacy Med Name: DULOXETINE DR '60MG'$  CAPSULES] 90 capsule 1    Sig: TAKE 1 CAPSULE(60 MG) BY MOUTH DAILY     Psychiatry: Antidepressants - SNRI - duloxetine Passed - 07/12/2022 12:28 PM      Passed - Cr in normal range and within 360 days    Creatinine, Ser  Date Value Ref Range Status  01/14/2022 0.66 0.57 - 1.00 mg/dL Final   Creatinine, Urine  Date Value Ref Range Status  06/07/2018 16 mg/dL Final         Passed - eGFR is 30 or above and within 360 days    GFR calc Af Amer  Date Value Ref Range Status  08/10/2018 >60 >60 mL/min Final   GFR calc non Af Amer  Date Value Ref Range Status  08/10/2018 >60 >60 mL/min Final   eGFR  Date Value Ref Range Status  01/14/2022 115 >59 mL/min/1.73 Final         Passed - Completed PHQ-2 or PHQ-9 in the last 360 days      Passed - Last BP in normal range    BP Readings from Last 1 Encounters:  02/25/22 108/75         Passed - Valid encounter within last 6 months    Recent Outpatient Visits           4 months ago Bilateral carpal tunnel syndrome   [place], Henrine Screws T, NP   5 months ago [place] Jon Billings, NP   9 months ago [place], NP   10 months ago [place], NP   11 months ago [place] Jon Billings, NP       Future Appointments             In 1 week Jon Billings, NP [place], [place]   In 1 week Brendolyn Patty, [place]

## 2022-07-22 ENCOUNTER — Ambulatory Visit (INDEPENDENT_AMBULATORY_CARE_PROVIDER_SITE_OTHER): Payer: Medicaid Other | Admitting: Nurse Practitioner

## 2022-07-22 ENCOUNTER — Ambulatory Visit: Payer: Medicaid Other | Admitting: Dermatology

## 2022-07-22 ENCOUNTER — Encounter: Payer: Self-pay | Admitting: Nurse Practitioner

## 2022-07-22 VITALS — BP 127/83 | HR 114 | Temp 98.7°F | Wt 270.1 lb

## 2022-07-22 DIAGNOSIS — Z136 Encounter for screening for cardiovascular disorders: Secondary | ICD-10-CM

## 2022-07-22 DIAGNOSIS — B070 Plantar wart: Secondary | ICD-10-CM | POA: Diagnosis not present

## 2022-07-22 DIAGNOSIS — E039 Hypothyroidism, unspecified: Secondary | ICD-10-CM

## 2022-07-22 DIAGNOSIS — M797 Fibromyalgia: Secondary | ICD-10-CM | POA: Diagnosis not present

## 2022-07-22 DIAGNOSIS — R7309 Other abnormal glucose: Secondary | ICD-10-CM

## 2022-07-22 MED ORDER — STIOLTO RESPIMAT 2.5-2.5 MCG/ACT IN AERS: INHALATION_SPRAY | RESPIRATORY_TRACT | 2 refills | Status: DC

## 2022-07-22 MED ORDER — SUMATRIPTAN SUCCINATE 100 MG PO TABS: 100.0000 mg | ORAL_TABLET | Freq: Every day | ORAL | 1 refills | Status: DC | PRN

## 2022-07-22 MED ORDER — IBUPROFEN 800 MG PO TABS: 800.0000 mg | ORAL_TABLET | Freq: Three times a day (TID) | ORAL | 1 refills | Status: DC | PRN

## 2022-07-22 MED ORDER — DULOXETINE HCL 60 MG PO CPEP: 60.0000 mg | ORAL_CAPSULE | Freq: Every day | ORAL | 1 refills | Status: DC

## 2022-07-22 MED ORDER — POLYETHYLENE GLYCOL 3350 17 G PO PACK: 17.0000 g | PACK | Freq: Every day | ORAL | 1 refills | Status: DC

## 2022-07-22 NOTE — Progress Notes (Signed)
BP 127/83   Pulse (!) 114   Temp 98.7 F (37.1 C) (Oral)   Wt 270 lb 1.6 oz (122.5 kg)   SpO2 98%   BMI 42.30 kg/m    Subjective:    Patient ID: Megan Bennett, female    DOB: 25-Jan-1986, 37 y.o.   MRN: [id number]  HPI: Megan Bennett is a 37 y.o. female  Chief Complaint  Patient presents with   Anxiety   ANXIETY Patient states her anxiety is improved. Patient states she feels like the Cymbalta is helping her mood.  Doesn't feel like it helps with her pain.  Doing well.  Denies SI.     07/22/2022    2:46 PM 02/25/2022    2:04 PM 01/14/2022    3:12 PM 10/15/2021    2:43 PM  GAD 7 : Generalized Anxiety Score  Nervous, Anxious, on Edge 1 1 1 1  $ Control/stop worrying 0 0 0 0  Worry too much - different things 0 0 0 1  Trouble relaxing 0 0 0 0  Restless 0 0 0 0  Easily annoyed or irritable 0 0 0 0  Afraid - awful might happen 0 0 0 0  Total GAD 7 Score 1 1 1 2  $ Anxiety Difficulty Somewhat difficult  Not difficult at all Somewhat difficult   [place] Office Visit from 07/22/2022 in [place]  PHQ-9 Total Score 2       HYPOTHYROIDISM Thyroid control status:controlled Satisfied with current treatment? yes Medication side effects: no Medication compliance: excellent compliance Etiology of hypothyroidism:  Recent dose adjustment:no Fatigue: yes Cold intolerance: no Heat intolerance: no Weight gain: no Weight loss: no Constipation: no Diarrhea/loose stools: no Palpitations: no Lower extremity edema: no Anxiety/depressed mood: yes  Patient states she is doing accutane with dermatology.  Doing well with it.  Denies changes in her mood.  Relevant past medical, surgical, family and social history reviewed and updated as indicated. Interim medical history since our last visit reviewed. Allergies and medications reviewed and updated.  Review of Systems  Constitutional:  Negative for fatigue and unexpected weight change.   Cardiovascular:  Negative for palpitations and leg swelling.  Gastrointestinal:  Negative for constipation and diarrhea.  Endocrine: Negative for cold intolerance and heat intolerance.  Musculoskeletal:  Positive for myalgias.  Skin:        Plantar wart  Psychiatric/Behavioral:  Positive for dysphoric mood. The patient is nervous/anxious.     Per HPI unless specifically indicated above     Objective:    BP 127/83   Pulse (!) 114   Temp 98.7 F (37.1 C) (Oral)   Wt 270 lb 1.6 oz (122.5 kg)   SpO2 98%   BMI 42.30 kg/m   Wt Readings from Last 3 Encounters:  07/22/22 270 lb 1.6 oz (122.5 kg)  06/19/22 275 lb (124.7 kg)  05/20/22 275 lb (124.7 kg)    Physical Exam Vitals and nursing note reviewed.  Constitutional:      General: She is not in acute distress.    Appearance: Normal appearance. She is obese. She is not ill-appearing, toxic-appearing or diaphoretic.  HENT:     Head: Normocephalic.     Right Ear: External ear normal.     Left Ear: External ear normal.     Nose: Nose normal.     Mouth/Throat:     Mouth: Mucous membranes are moist.     Pharynx: Oropharynx is clear.  Eyes:  General:        Right eye: No discharge.        Left eye: No discharge.     Extraocular Movements: Extraocular movements intact.     Conjunctiva/sclera: Conjunctivae normal.     Pupils: Pupils are equal, round, and reactive to light.  Cardiovascular:     Rate and Rhythm: Normal rate and regular rhythm.     Heart sounds: No murmur heard. Pulmonary:     Effort: Pulmonary effort is normal. No respiratory distress.     Breath sounds: Normal breath sounds. No wheezing or rales.  Musculoskeletal:     Cervical back: Normal range of motion and neck supple.  Skin:    General: Skin is warm and dry.     Capillary Refill: Capillary refill takes less than 2 seconds.  Neurological:     General: No focal deficit present.     Mental Status: She is alert and oriented to person, place, and time.  Mental status is at baseline.  Psychiatric:        Mood and Affect: Mood normal.        Behavior: Behavior normal.        Thought Content: Thought content normal.        Judgment: Judgment normal.     Results for orders placed or performed in visit on 01/14/22  Comp Met (CMET)  Result Value Ref Range   Glucose 93 70 - 99 mg/dL   BUN 15 6 - 20 mg/dL   Creatinine, Ser 0.66 0.57 - 1.00 mg/dL   eGFR 115 >59 mL/min/1.73   BUN/Creatinine Ratio 23 9 - 23   Sodium 142 134 - 144 mmol/L   Potassium 3.9 3.5 - 5.2 mmol/L   Chloride 106 96 - 106 mmol/L   CO2 20 20 - 29 mmol/L   Calcium 9.2 8.7 - 10.2 mg/dL   Total Protein 6.6 6.0 - 8.5 g/dL   Albumin 4.0 3.9 - 4.9 g/dL   Globulin, Total 2.6 1.5 - 4.5 g/dL   Albumin/Globulin Ratio 1.5 1.2 - 2.2   Bilirubin Total 0.4 0.0 - 1.2 mg/dL   Alkaline Phosphatase 82 44 - 121 IU/L   AST 15 0 - 40 IU/L   ALT 14 0 - 32 IU/L  TSH  Result Value Ref Range   TSH 3.490 0.450 - 4.500 uIU/mL  T4, free  Result Value Ref Range   Free T4 1.07 0.82 - 1.77 ng/dL      Assessment & Plan:   Problem List Items Addressed This Visit       Endocrine   Acquired hypothyroidism - Primary    Chronic.  Controlled.  Continue with current medication regimen of Levothyroxine 58mg daily.  Labs ordered today.  Return to clinic in 6 months for reevaluation.  Call sooner if concerns arise.        Relevant Orders   TSH   T4, free     Other   Fibromyalgia    Chronic. Uses Ibuprofen PRN.  Discussed risks of taking NSAIDS. Patient is okay with continuing.  Labs ordered today.  Follow up in 6 in months.  Call sooner if concerns arise.       Relevant Medications   DULoxetine (CYMBALTA) 60 MG capsule   ibuprofen (ADVIL) 800 MG tablet   Other Relevant Orders   Comp Met (CMET)   Other Visit Diagnoses     Screening for ischemic heart disease       Relevant Orders  Lipid Profile   Plantar wart       Relevant Orders   Ambulatory referral to Podiatry         Follow up plan: Return in about 6 months (around 01/20/2023) for Physical and Fasting labs.

## 2022-07-22 NOTE — Assessment & Plan Note (Signed)
Chronic.  Controlled.  Continue with current medication regimen of Levothyroxine 3mg daily.  Labs ordered today.  Return to clinic in 6 months for reevaluation.  Call sooner if concerns arise.

## 2022-07-22 NOTE — Assessment & Plan Note (Signed)
Chronic. Uses Ibuprofen PRN.  Discussed risks of taking NSAIDS. Patient is okay with continuing.  Labs ordered today.  Follow up in 6 in months.  Call sooner if concerns arise.

## 2022-07-23 ENCOUNTER — Ambulatory Visit: Payer: Medicaid Other | Admitting: Dermatology

## 2022-07-23 ENCOUNTER — Encounter: Payer: Self-pay | Admitting: Dermatology

## 2022-07-23 VITALS — BP 137/77 | HR 97

## 2022-07-23 DIAGNOSIS — L853 Xerosis cutis: Secondary | ICD-10-CM

## 2022-07-23 DIAGNOSIS — L732 Hidradenitis suppurativa: Secondary | ICD-10-CM

## 2022-07-23 DIAGNOSIS — Z79899 Other long term (current) drug therapy: Secondary | ICD-10-CM | POA: Diagnosis not present

## 2022-07-23 DIAGNOSIS — K130 Diseases of lips: Secondary | ICD-10-CM | POA: Diagnosis not present

## 2022-07-23 LAB — COMPREHENSIVE METABOLIC PANEL
ALT: 18 IU/L (ref 0–32)
AST: 15 IU/L (ref 0–40)
Albumin/Globulin Ratio: 1.4 (ref 1.2–2.2)
Albumin: 4.1 g/dL (ref 3.9–4.9)
Alkaline Phosphatase: 114 IU/L (ref 44–121)
BUN/Creatinine Ratio: 15 (ref 9–23)
BUN: 12 mg/dL (ref 6–20)
Bilirubin Total: 0.5 mg/dL (ref 0.0–1.2)
CO2: 17 mmol/L — ABNORMAL LOW (ref 20–29)
Calcium: 9.2 mg/dL (ref 8.7–10.2)
Chloride: 104 mmol/L (ref 96–106)
Creatinine, Ser: 0.79 mg/dL (ref 0.57–1.00)
Globulin, Total: 3 g/dL (ref 1.5–4.5)
Glucose: 119 mg/dL — ABNORMAL HIGH (ref 70–99)
Potassium: 4.1 mmol/L (ref 3.5–5.2)
Sodium: 139 mmol/L (ref 134–144)
Total Protein: 7.1 g/dL (ref 6.0–8.5)
eGFR: 98 mL/min/{1.73_m2} (ref >59)

## 2022-07-23 LAB — TSH: TSH: 3.41 u[IU]/mL (ref 0.450–4.500)

## 2022-07-23 LAB — LIPID PANEL
Chol/HDL Ratio: 5.6 ratio — ABNORMAL HIGH (ref 0.0–4.4)
Cholesterol, Total: 220 mg/dL — ABNORMAL HIGH (ref 100–199)
HDL: 39 mg/dL — ABNORMAL LOW (ref >39)
LDL Chol Calc (NIH): 133 mg/dL — ABNORMAL HIGH (ref 0–99)
Triglycerides: 269 mg/dL — ABNORMAL HIGH (ref 0–149)
VLDL Cholesterol Cal: 48 mg/dL — ABNORMAL HIGH (ref 5–40)

## 2022-07-23 LAB — T4, FREE: Free T4: 0.89 ng/dL (ref 0.82–1.77)

## 2022-07-23 MED ORDER — ISOTRETINOIN 30 MG PO CAPS: 30.0000 mg | ORAL_CAPSULE | Freq: Two times a day (BID) | ORAL | 0 refills | Status: DC

## 2022-07-23 NOTE — Progress Notes (Signed)
Isotretinoin Follow-Up Visit   Subjective  Megan Bennett is a 37 y.o. female who presents for the following: Acne (1 month follow up on isotretinoin. Patient here for follow up hidradenitis suppurativa).  Week # 24   Isotretinoin F/U - 07/23/22 1100       Isotretinoin Follow Up   iPledge # [id number]    Date 07/23/22    Two Forms of Birth Control Tubal Sterilization;Female Condom    Acne breakouts since last visit? No      Dosage   Target Dosage (mg) 24940    Current (To Date) Dosage (mg) 8700    To Go Dosage (mg) 16240      Side Effects   Skin Chapped Lips;Dry Lips;Dry Eyes;Eye Irritation;Dry Nose    Gastrointestinal WNL    Neurological WNL    Constitutional WNL             Side effects: Dry skin, dry lips  Denies changes in night vision, shortness of breath, abdominal pain, nausea, vomiting, diarrhea, blood in stool or urine, visual changes, headaches, epistaxis, joint pain, myalgias, mood changes, depression, or suicidal ideation.   Patient is not pregnant, not seeking pregnancy, and not breastfeeding.   The following portions of the chart were reviewed this encounter and updated as appropriate: medications, allergies, medical history  Review of Systems:  No other skin or systemic complaints except as noted in HPI or Assessment and Plan.  Objective  Well appearing patient in no apparent distress; mood and affect are within normal limits.  An examination of the face, neck, chest, and back was performed and relevant findings are noted below.   b/l axilla, groin, thighs Firm pink nodule with scarring at right axilla , pinks scars and follicular papules at inframammary, nodules on left medial breast     Assessment & Plan   Hidradenitis suppurativa b/l axilla, groin, thighs  Chronic and persistent condition with duration or expected duration over one year. Condition is symptomatic/ bothersome to patient. Not currently at goal.  Pt on isotretinoin  with improvement.  Week 24  Total dose  8700 mg  Total mg/kg dose 71.02 mg/kg  Ipledge# [id number] [facility] in [place], [place]    Hidradenitis Suppurativa is a chronic; persistent; non-curable, but treatable condition due to abnormal inflamed sweat glands in the body folds (axilla, inframammary, groin, medial thighs), causing recurrent painful draining cysts and scarring. It can be associated with severe scarring acne and cysts; also abscesses and scarring of scalp. The goal is control and prevention of flares, as it is not curable. Scars are permanent and can be thickened. Treatment may include daily use of topical medication and oral antibiotics.  Oral isotretinoin may also be helpful.  For more severe cases, Humira (a biologic injection) may be prescribed to decrease the inflammatory process and prevent flares.  When indicated, inflamed cysts may also be treated surgically.   Continue Isotretinoin 30 mg 2 po qd   Reviewed potential side effects of isotretinoin including xerosis, cheilitis, hepatitis, hyperlipidemia, and severe birth defects if taken by a pregnant woman. Reviewed reports of suicidal ideation in those with a history of depression while taking isotretinoin and reports of diagnosis of inflammatory bowl disease while taking isotretinoin as well as the lack of evidence for a causal relationship between isotretinoin, depression and IBD. Patient advised to reach out with any questions or concerns. Patient advised not to share pills or donate blood while on treatment or for one month after completing treatment.  Urine pregnancy test performed in office today and was negative. Patient demonstrates comprehension and confirms she will not get pregnant.  Lot [id number] Exp 08/09/2023  Two methods of BC include tubal ligation and female latex condom    Reviewed 07/22/21 labs cmp/ cbc with diff/ lipid panel, TGS somewhat elevated but pt not fasting  Patient confirmed in iPledge and isotretinoin  sent to pharmacy.   Related Medications ISOtretinoin (ACCUTANE) 30 MG capsule Take 1 capsule (30 mg total) by mouth 2 (two) times daily.    Xerosis secondary to isotretinoin therapy - Continue emollients as directed - Xyzal (levocetirizine) once a day and fish oil 1 gram daily may also help with dryness  Cheilitis secondary to isotretinoin therapy - Continue lip balm as directed, Dr. Luvenia Heller Cortibalm recommended Aquaphor lip balm samples and dr Pearline Cables samples given  Long term medication management (isotretinoin) - While taking Isotretinoin and for 30 days after you finish the medication, do not get pregnant, do not share pills, do not donate blood. Isotretinoin is best absorbed when taken with a fatty meal. Isotretinoin can make you sensitive to the sun. Daily careful sun protection including sunscreen SPF 30+ when outdoors is recommended.  Follow-up in 30 days.  I, Ruthell Rummage, CMA, am acting as scribe for Brendolyn Patty, MD.  Documentation: I have reviewed the above documentation for accuracy and completeness, and I agree with the above.  Brendolyn Patty MD

## 2022-07-23 NOTE — Progress Notes (Signed)
Good Morning. It was nice to see you yesterday.  Your lab work looks good.  Your glucose is elevated.  I would like you to come back and have a measure of diabetes checked.  Your cholesterol is elevated.  I recommend following a low fat diet and exercise. You can come back as a lab visit to have that extra lab checked.  No other concerns at this time. Continue with your current medication regimen.  Follow up as discussed.  Please let me know if you have any questions.

## 2022-07-23 NOTE — Patient Instructions (Addendum)
Recommend Zeasorb AF powder - found over the counter in athlete feet section of drug store instead of      While taking Isotretinoin and for 30 days after you finish the medication, do not get pregnant, do not share pills, do not donate blood.  Generic isotretinoin is best absorbed when taken with a fatty meal. Isotretinoin can make you sensitive to the sun. Daily careful sun protection including sunscreen SPF 30+ when outdoors is recommended.   Due to recent changes in healthcare laws, you may see results of your pathology and/or laboratory studies on MyChart before the doctors have had a chance to review them. We understand that in some cases there may be results that are confusing or concerning to you. Please understand that not all results are received at the same time and often the doctors may need to interpret multiple results in order to provide you with the best plan of care or course of treatment. Therefore, we ask that you please give [place] 2 business days to thoroughly review all your results before contacting the office for clarification. Should we see a critical lab result, you will be contacted sooner.   If You Need Anything After Your Visit  If you have any questions or concerns for your doctor, please call our main line at [phone] and press option 4 to reach your doctor's medical assistant. If no one answers, please leave a voicemail as directed and we will return your call as soon as possible. Messages left after 4 pm will be answered the following business day.   You may also send [place] a message via [place]. We typically respond to MyChart messages within 1-2 business days.  For prescription refills, please ask your pharmacy to contact our office. Our fax number is [phone].  If you have an urgent issue when the clinic is closed that cannot wait until the next business day, you can page your doctor at the number below.    Please note that while we do our best to be  available for urgent issues outside of office hours, we are not available 24/7.   If you have an urgent issue and are unable to reach [place], you may choose to seek medical care at your doctor's office, retail clinic, urgent care center, or emergency room.  If you have a medical emergency, please immediately call 911 or go to the emergency department.  Pager Numbers  - Dr. Nehemiah Massed: [phone]  - Dr. Laurence Ferrari: [phone]  - Dr. Nicole Kindred: [phone]  In the event of inclement weather, please call our main line at [phone] for an update on the status of any delays or closures.  Dermatology Medication Tips: Please keep the boxes that topical medications come in in order to help keep track of the instructions about where and how to use these. Pharmacies typically print the medication instructions only on the boxes and not directly on the medication tubes.   If your medication is too expensive, please contact our office at [phone] option 4 or send [place] a message through [place].   We are unable to tell what your co-pay for medications will be in advance as this is different depending on your insurance coverage. However, we may be able to find a substitute medication at lower cost or fill out paperwork to get insurance to cover a needed medication.   If a prior authorization is required to get your medication covered by your insurance company, please allow [place] 1-2 business days to complete this  process.  Drug prices often vary depending on where the prescription is filled and some pharmacies may offer cheaper prices.  The website www.goodrx.com contains coupons for medications through different pharmacies. The prices here do not account for what the cost may be with help from insurance (it may be cheaper with your insurance), but the website can give you the price if you did not use any insurance.  - You can print the associated coupon and take it with your prescription to the pharmacy.  -  You may also stop by our office during regular business hours and pick up a GoodRx coupon card.  - If you need your prescription sent electronically to a different pharmacy, notify our office through [facility] or by phone at [phone] option 4.     Si Usted Necesita Algo Despus de Su Visita  Tambin puede enviarnos un mensaje a travs de Pharmacist, community. Por lo general respondemos a los mensajes de MyChart en el transcurso de 1 a 2 das hbiles.  Para renovar recetas, por favor pida a su farmacia que se ponga en contacto con nuestra oficina. Harland Dingwall de fax es [place] [phone].  Si tiene un asunto urgente cuando la clnica est cerrada y que no puede esperar hasta el siguiente da hbil, puede llamar/localizar a su doctor(a) al nmero que aparece a continuacin.   Por favor, tenga en cuenta que aunque hacemos todo lo posible para estar disponibles para asuntos urgentes fuera del horario de [place], no estamos disponibles las 24 horas del da, los 7 das de la [place].   Si tiene un problema urgente y no puede comunicarse con nosotros, puede optar por buscar atencin mdica  en el consultorio de su doctor(a), en una clnica privada, en un centro de atencin urgente o en una sala de emergencias.  Si tiene Engineering geologist, por favor llame inmediatamente al 911 o vaya a la sala de emergencias.  Nmeros de bper  - Dr. Nehemiah Massed: [phone]  - Dra. Moye: [phone]  - Dra. Nicole Kindred: [phone]  En caso de inclemencias del [place], por favor llame a Johnsie Kindred principal al [phone] para una actualizacin sobre el [place] de cualquier retraso o cierre.  Consejos para la medicacin en dermatologa: Por favor, guarde las cajas en las que vienen los medicamentos de uso tpico para ayudarle a seguir las instrucciones sobre dnde y cmo usarlos. Las farmacias generalmente imprimen las instrucciones del medicamento slo en las cajas y no directamente en los tubos del  [place].   Si su medicamento es muy caro, por favor, pngase en contacto con Zigmund Daniel llamando al [phone] y presione la opcin 4 o envenos un mensaje a travs de Pharmacist, community.   No podemos decirle cul ser su copago por los medicamentos por adelantado ya que esto es diferente dependiendo de la cobertura de su seguro. Sin embargo, es posible que podamos encontrar un medicamento sustituto a Electrical engineer un formulario para que el seguro cubra el medicamento que se considera necesario.   Si se requiere una autorizacin previa para que su compaa de seguros [place] su medicamento, por favor permtanos de 1 a 2 das hbiles para completar este proceso.  Los precios de los medicamentos varan con frecuencia dependiendo del Environmental consultant de dnde se surte la receta y alguna farmacias pueden ofrecer precios ms baratos.  El sitio web www.goodrx.com tiene cupones para medicamentos de Airline pilot. Los precios aqu no tienen en cuenta lo que podra costar con la ayuda del seguro (puede  ser ms barato con su seguro), pero el sitio web puede darle el precio si no Field seismologist.  - Puede imprimir el cupn correspondiente y llevarlo con su receta a la farmacia.  - Tambin puede pasar por nuestra oficina durante el horario de atencin regular y Charity fundraiser una tarjeta de cupones de GoodRx.  - Si necesita que su receta se enve electrnicamente a una farmacia diferente, informe a nuestra oficina a travs de MyChart de [place] o por telfono llamando al [phone] y presione la opcin 4.

## 2022-07-23 NOTE — Addendum Note (Signed)
Addended by: Jon Billings on: 07/23/2022 08:11 AM   Modules accepted: Orders

## 2022-07-24 ENCOUNTER — Encounter: Payer: Self-pay | Admitting: Nurse Practitioner

## 2022-08-09 DIAGNOSIS — Z419 Encounter for procedure for purposes other than remedying health state, unspecified: Secondary | ICD-10-CM | POA: Diagnosis not present

## 2022-08-12 ENCOUNTER — Ambulatory Visit (INDEPENDENT_AMBULATORY_CARE_PROVIDER_SITE_OTHER): Payer: Medicaid Other

## 2022-08-12 ENCOUNTER — Encounter: Payer: Self-pay | Admitting: Podiatry

## 2022-08-12 ENCOUNTER — Ambulatory Visit: Payer: Medicaid Other | Admitting: Podiatry

## 2022-08-12 VITALS — BP 132/90 | HR 95

## 2022-08-12 DIAGNOSIS — F17200 Nicotine dependence, unspecified, uncomplicated: Secondary | ICD-10-CM | POA: Diagnosis not present

## 2022-08-12 DIAGNOSIS — M216X2 Other acquired deformities of left foot: Secondary | ICD-10-CM

## 2022-08-12 DIAGNOSIS — Q828 Other specified congenital malformations of skin: Secondary | ICD-10-CM

## 2022-08-12 NOTE — Progress Notes (Unsigned)
  Subjective:  Patient ID: Megan Bennett, female    DOB: 1986/01/04,  MRN: [id number]  Chief Complaint  Patient presents with   Callouses    "I have this wart or a callus." N - callus painful L - 5th metatarsal left D - 2 years O - very sore, sharp pains C - gradually worse A - walking, standing, wearing sneakers T - been to several doctors, acid treatments, frozen    37 y.o. female presents with the above complaint. History confirmed with patient.  She has had multiple treatments for this including different essence and creams applied by herself and at 36 offices to blister off the lesion, this never helped and the problem kept coming back.  Is been going on for years and she would like definitive treatment for now.  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    Radiographs: Multiple views x-ray of the left foot: no fracture, dislocation, swelling or degenerative changes noted and tailor's bunion deformity with plantarflexion and bowing of the metatarsal Assessment:   1. Plantar flexed metatarsal, left   2. Porokeratosis   3. Current smoker      Plan:  Patient was evaluated and treated and all questions answered.  I reviewed her radiographs with her and discussed the bowing of the metatarsal and contributing of the bony prominence to the lesion.  We also discussed possible excision of the lesion which she requested herself today.  I discussed with her the difficulty with this and the need for absolute nonweightbearing after surgery for up to 3 weeks as well as the impact of smoking both on wound healing and bone healing.  We discussed the risk of these as well as the complications of  possible infection if she does not stop smoking.  She says she is willing to do this.  She also says she is able to stay off of it completely.  We discussed the risk benefits and potential complication of surgery including but not limited to  pain, swelling, infection, scar, numbness which may be temporary or permanent, chronic pain, stiffness, nerve pain or damage, wound healing problems, bone healing problems including delayed or non-union.  All questions were addressed.  Informed consent was signed and reviewed.  I advised her that she will stop smoking at least 3 weeks before and after surgery and up to 8 weeks if bone healing is slow.  She understands and wishes to proceed.   Surgical plan:  Procedure: -Fifth metatarsal osteotomy, excision of benign lesion  Location: -[facility]  Anesthesia plan: -local with sedation  Postoperative pain plan: - Tylenol 1000 mg every 6 hours, ibuprofen 600 mg every 6 hours, gabapentin 300 mg every 8 hours x5 days, oxycodone 5 mg 1-2 tabs every 6 hours only as needed  DVT prophylaxis: -none required  WB Restrictions / DME needs: -NWB in surgical shoe with crutches and/or knee scooter    No follow-ups on file.

## 2022-08-12 NOTE — Patient Instructions (Signed)
You will not be able to put any weight on the foot after surgery for 3 weeks. I recommend a rolling knee scooter (you must purchase or rent this, your insurance will not cover it). The surgery center will have crutches.   You must stop smoking for at least 3 weeks prior to and 3 weeks following surgery, I recommend stopping for longer until the bone fully heals which will take 6-8 weeks.  Smoking will greatly increase your risk of a complication or infection after surgery

## 2022-08-18 ENCOUNTER — Telehealth: Payer: Medicaid Other | Admitting: Family Medicine

## 2022-08-18 DIAGNOSIS — J0101 Acute recurrent maxillary sinusitis: Secondary | ICD-10-CM

## 2022-08-18 MED ORDER — LEVOFLOXACIN 500 MG PO TABS: 500.0000 mg | ORAL_TABLET | Freq: Every day | ORAL | 0 refills | Status: AC

## 2022-08-18 NOTE — Progress Notes (Signed)
Virtual Visit Consent   Megan Bennett, you are scheduled for a virtual visit with a [place] [facility] provider today. Just as with appointments in the office, your consent must be obtained to participate. Your consent will be active for this visit and any virtual visit you may have with one of our providers in the next 365 days. If you have a MyChart account, a copy of this consent can be sent to you electronically.  As this is a virtual visit, video technology does not allow for your provider to perform a traditional examination. This may limit your provider's ability to fully assess your condition. If your provider identifies any concerns that need to be evaluated in person or the need to arrange testing (such as labs, EKG, etc.), we will make arrangements to do so. Although advances in technology are sophisticated, we cannot ensure that it will always work on either your end or our end. If the connection with a video visit is poor, the visit may have to be switched to a telephone visit. With either a video or telephone visit, we are not always able to ensure that we have a secure connection.  By engaging in this virtual visit, you consent to the provision of healthcare and authorize for your insurance to be billed (if applicable) for the services provided during this visit. Depending on your insurance coverage, you may receive a charge related to this service.  I need to obtain your verbal consent now. Are you willing to proceed with your visit today? CARYLE LICHLYTER has provided verbal consent on 08/18/2022 for a virtual visit (video or telephone). Dellia Nims, FNP  Date: 08/18/2022 2:03 PM  Virtual Visit via Video Note   I, Dellia Nims, connected with  Megan Bennett  ([id number], 1985/06/17) on 08/18/22 at  2:00 PM EDT by a video-enabled telemedicine application and verified that I am speaking with the correct person using two identifiers.  Location: Patient: Virtual Visit  Location Patient: Home Provider: Virtual Visit Location Provider: Home Office   I discussed the limitations of evaluation and management by telemedicine and the availability of in person appointments. The patient expressed understanding and agreed to proceed.    History of Present Illness: Megan Bennett is a 37 y.o. who identifies as a female who was assigned female at birth, and is being seen today for maxillary sinus pain and pressure for 5-6 days, unknown fever, slight post nasal drainage, headache, slight cough. No wheezing or sob. Marland Kitchen  HPI: HPI  Problems:  Patient Active Problem List   Diagnosis Date Noted   Bilateral carpal tunnel syndrome 02/25/2022   Acquired hypothyroidism 08/13/2021   Anxiety 08/13/2021   Fibromyalgia 03/19/2021   Nicotine dependence, cigarettes, uncomplicated 123456   S/P laparoscopic surgery 08/04/2018    Allergies:  Allergies  Allergen Reactions   Amoxicillin Rash    Did it involve swelling of the face/tongue/throat, SOB, or low BP? No Did it involve sudden or severe rash/hives, skin peeling, or any reaction on the inside of your mouth or nose? No Did you need to seek medical attention at a hospital or doctor's office? No When did it last happen?      14 + years If all above answers are "NO", may proceed with cephalosporin use.,    Hydrocodone Nausea And Vomiting   Wellbutrin [Bupropion] Other (See Comments)    shakey   Medications:  Current Outpatient Medications:    diphenhydrAMINE (BENADRYL) 25 mg capsule, Take 25 mg by  mouth as needed for allergies., Disp: , Rfl:    DULoxetine (CYMBALTA) 60 MG capsule, Take 1 capsule (60 mg total) by mouth daily., Disp: 90 capsule, Rfl: 1   fluticasone (FLONASE) 50 MCG/ACT nasal spray, Place 2 sprays into both nostrils daily., Disp: 16 g, Rfl: 0   ibuprofen (ADVIL) 800 MG tablet, Take 1 tablet (800 mg total) by mouth every 8 (eight) hours as needed., Disp: 90 tablet, Rfl: 1   ISOtretinoin (ACCUTANE)  30 MG capsule, Take 1 capsule (30 mg total) by mouth 2 (two) times daily., Disp: 60 capsule, Rfl: 0   levothyroxine (SYNTHROID) 25 MCG tablet, TAKE 1 TABLET(25 MCG) BY MOUTH DAILY, Disp: 90 tablet, Rfl: 3   polyethylene glycol (MIRALAX) 17 g packet, Take 17 g by mouth daily., Disp: 14 each, Rfl: 1   STIOLTO RESPIMAT 2.5-2.5 MCG/ACT AERS, Inhale 2 puffs by mouth daily, Disp: 4 g, Rfl: 2   SUMAtriptan (IMITREX) 100 MG tablet, Take 1 tablet (100 mg total) by mouth daily as needed., Disp: 10 tablet, Rfl: 1   triamcinolone cream (KENALOG) 0.1 %, Apply 1 Application topically 2 (two) times daily. Apply to affected areas twice daily. Avoid applying to face, groin, and axilla. Use as directed. Long-term use can cause thinning of the skin., Disp: 80 g, Rfl: 1   VENTOLIN HFA 108 (90 Base) MCG/ACT inhaler, INHALE 2 PUFFS INTO THE LUNGS EVERY 6 HOURS AS NEEDED FOR WHEEZING OR SHORTNESS OF BREATH, Disp: 18 g, Rfl: 1  Observations/Objective: Patient is well-developed, well-nourished in no acute distress.  Resting comfortably  at home.  Head is normocephalic, atraumatic.  No labored breathing.  Speech is clear and coherent with logical content.  Patient is alert and oriented at baseline.    Assessment and Plan: 1. Acute recurrent maxillary sinusitis  Increase fluids, cool mist humidifier at night, tylenol or ibuprofen as directed, flonase and go to UC or PCP for reoccurrance.   Follow Up Instructions: I discussed the assessment and treatment plan with the patient. The patient was provided an opportunity to ask questions and all were answered. The patient agreed with the plan and demonstrated an understanding of the instructions.  A copy of instructions were sent to the patient via MyChart unless otherwise noted below.     The patient was advised to call back or seek an in-person evaluation if the symptoms worsen or if the condition fails to improve as anticipated.  Time:  I spent 10 minutes with the  patient via telehealth technology discussing the above problems/concerns.    Dellia Nims, FNP

## 2022-08-18 NOTE — Patient Instructions (Signed)

## 2022-08-22 ENCOUNTER — Ambulatory Visit: Payer: Medicaid Other | Admitting: Dermatology

## 2022-08-22 VITALS — Wt 275.0 lb

## 2022-08-22 DIAGNOSIS — K130 Diseases of lips: Secondary | ICD-10-CM | POA: Diagnosis not present

## 2022-08-22 DIAGNOSIS — L853 Xerosis cutis: Secondary | ICD-10-CM

## 2022-08-22 DIAGNOSIS — L732 Hidradenitis suppurativa: Secondary | ICD-10-CM | POA: Diagnosis not present

## 2022-08-22 DIAGNOSIS — Z79899 Other long term (current) drug therapy: Secondary | ICD-10-CM | POA: Diagnosis not present

## 2022-08-22 MED ORDER — ISOTRETINOIN 30 MG PO CAPS: 30.0000 mg | ORAL_CAPSULE | Freq: Two times a day (BID) | ORAL | 0 refills | Status: DC

## 2022-08-22 NOTE — Progress Notes (Signed)
Isotretinoin Follow-Up Visit   Subjective  Megan Bennett is a 37 y.o. female who presents for the following: hidradenitis suppurativa (1 month follow up for HS, week 28 of isotretinion 30 mg bid, reports dry skin , dry lips, dry nose. Denies other side effects. Reports recent flare forming at lower abdomen area. )  Week # 28   Isotretinoin F/U - 08/22/22 1400       Isotretinoin Follow Up   iPledge # [id number]    Date 08/22/22    Weight 275 lb (124.7 kg)    Two Forms of Birth Control Tubal Sterilization    Acne breakouts since last visit? No      Dosage   Target Dosage (mg) 24940    Current (To Date) Dosage (mg) 10500    To Go Dosage (mg) 14440      Side Effects   Skin Chapped Lips;Dry Eyes;Dry Lips;Dry Nose;Dry Skin    Gastrointestinal WNL    Neurological WNL    Constitutional WNL            Side effects: Dry skin, dry lips  Denies changes in night vision, shortness of breath, abdominal pain, nausea, vomiting, diarrhea, blood in stool or urine, visual changes, headaches, epistaxis, joint pain, myalgias, mood changes, depression, or suicidal ideation.   Patient is not pregnant, not seeking pregnancy, and not breastfeeding.   The following portions of the chart were reviewed this encounter and updated as appropriate: medications, allergies, medical history  Review of Systems:  No other skin or systemic complaints except as noted in HPI or Assessment and Plan.  Objective  Well appearing patient in no apparent distress; mood and affect are within normal limits.  An examination of the face, neck, chest, and back was performed and relevant findings are noted below.    Assessment & Plan   Hidradenitis suppurativa axilllae, groin, thighs  Chronic and persistent condition with duration or expected duration over one year. Condition is symptomatic/ bothersome to patient. Not currently at goal.  Pt on isotretinoin with improvement.   Week 28 Total dose   10500 mg  Total mg/kg dose 84.20 mg/kg  Ipledge# [id number] [facility] in [place], [place]  Two methods of BC include tubal ligation and female latex condom  Continue Isotretinion 30 mg capsule - take 1 capsule by mouth twice daily  Will plan to continue medication another month until foot surgery, will hold until after healed.    Hidradenitis Suppurativa is a chronic; persistent; non-curable, but treatable condition due to abnormal inflamed sweat glands in the body folds (axilla, inframammary, groin, medial thighs), causing recurrent painful draining cysts and scarring. It can be associated with severe scarring acne and cysts; also abscesses and scarring of scalp. The goal is control and prevention of flares, as it is not curable. Scars are permanent and can be thickened. Treatment may include daily use of topical medication and oral antibiotics.  Oral isotretinoin may also be helpful.  For more severe cases, Humira (a biologic injection) may be prescribed to decrease the inflammatory process and prevent flares.  When indicated, inflamed cysts may also be treated surgically.   Reviewed potential side effects of isotretinoin including xerosis, cheilitis, hepatitis, hyperlipidemia, and severe birth defects if taken by a pregnant woman. Reviewed reports of suicidal ideation in those with a history of depression while taking isotretinoin and reports of diagnosis of inflammatory bowl disease while taking isotretinoin as well as the lack of evidence for a causal relationship between isotretinoin, depression and  IBD. Patient advised to reach out with any questions or concerns. Patient advised not to share pills or donate blood while on treatment or for one month after completing treatment.   Urine pregnancy test performed in office today and was negative.  Patient demonstrates comprehension and confirms she will not get pregnant.   Patient confirmed in iPledge and isotretinoin sent to pharmacy.      Related  Medications ISOtretinoin (ACCUTANE) 30 MG capsule Take 1 capsule (30 mg total) by mouth 2 (two) times daily.  Long-term use of high-risk medication  Medication management   Xerosis secondary to isotretinoin therapy - Continue emollients as directed - Xyzal (levocetirizine) once a day and fish oil 1 gram daily may also help with dryness  Cheilitis secondary to isotretinoin therapy - Continue lip balm as directed, Dr. Luvenia Heller Cortibalm recommended  Long term medication management (isotretinoin) - While taking Isotretinoin and for 30 days after you finish the medication, do not get pregnant, do not share pills, do not donate blood. Isotretinoin is best absorbed when taken with a fatty meal. Isotretinoin can make you sensitive to the sun. Daily careful sun protection including sunscreen SPF 30+ when outdoors is recommended.  Follow-up in 30 days.  IRuthell Rummage, CMA, am acting as scribe for Sarina Ser, MD.  Documentation: I have reviewed the above documentation for accuracy and completeness, and I agree with the above.  Sarina Ser, MD

## 2022-08-22 NOTE — Patient Instructions (Addendum)
Acne is Severe; chronic and persistent; not at goal. Patient is on Isotretinoin -  requiring FDA mandated monthly evaluations and laboratory monitoring.  While taking Isotretinoin and for 30 days after you finish the medication, do not get pregnant, do not share pills, do not donate blood.  Generic isotretinoin is best absorbed when taken with a fatty meal. Isotretinoin can make you sensitive to the sun. Daily careful sun protection including sunscreen SPF 30+ when outdoors is recommended.   Due to recent changes in healthcare laws, you may see results of your pathology and/or laboratory studies on MyChart before the doctors have had a chance to review them. We understand that in some cases there may be results that are confusing or concerning to you. Please understand that not all results are received at the same time and often the doctors may need to interpret multiple results in order to provide you with the best plan of care or course of treatment. Therefore, we ask that you please give us 2 business days to thoroughly review all your results before contacting the office for clarification. Should we see a critical lab result, you will be contacted sooner.   If You Need Anything After Your Visit  If you have any questions or concerns for your doctor, please call our main line at [phone] and press option 4 to reach your doctor's medical assistant. If no one answers, please leave a voicemail as directed and we will return your call as soon as possible. Messages left after 4 pm will be answered the following business day.   You may also send us a message via MyChart. We typically respond to MyChart messages within 1-2 business days.  For prescription refills, please ask your pharmacy to contact our office. Our fax number is [phone].  If you have an urgent issue when the clinic is closed that cannot wait until the next business day, you can page your doctor at the number below.    Please  note that while we do our best to be available for urgent issues outside of office hours, we are not available 24/7.   If you have an urgent issue and are unable to reach us, you may choose to seek medical care at your doctor's office, retail clinic, urgent care center, or emergency room.  If you have a medical emergency, please immediately call 911 or go to the emergency department.  Pager Numbers  - Dr. Kowalski: [phone]  - Dr. Moye: [phone]  - Dr. Stewart: [phone]  In the event of inclement weather, please call our main line at [phone] for an update on the status of any delays or closures.  Dermatology Medication Tips: Please keep the boxes that topical medications come in in order to help keep track of the instructions about where and how to use these. Pharmacies typically print the medication instructions only on the boxes and not directly on the medication tubes.   If your medication is too expensive, please contact our office at [phone] option 4 or send us a message through MyChart.   We are unable to tell what your co-pay for medications will be in advance as this is different depending on your insurance coverage. However, we may be able to find a substitute medication at lower cost or fill out paperwork to get insurance to cover a needed medication.   If a prior authorization is required to get your medication covered by your insurance company, please allow us 1-2 business days to complete   this process.  Drug prices often vary depending on where the prescription is filled and some pharmacies may offer cheaper prices.  The website www.goodrx.com contains coupons for medications through different pharmacies. The prices here do not account for what the cost may be with help from insurance (it may be cheaper with your insurance), but the website can give you the price if you did not use any insurance.  - You can print the associated coupon and take it with  your prescription to the pharmacy.  - You may also stop by our office during regular business hours and pick up a GoodRx coupon card.  - If you need your prescription sent electronically to a different pharmacy, notify our office through [facility] MyChart or by phone at [phone] option 4.     Si Usted Necesita Algo Despus de Su Visita  Tambin puede enviarnos un mensaje a travs de MyChart. Por lo general respondemos a los mensajes de MyChart en el transcurso de 1 a 2 das hbiles.  Para renovar recetas, por favor pida a su farmacia que se ponga en contacto con nuestra oficina. Nuestro nmero de fax es el [phone].  Si tiene un asunto urgente cuando la clnica est cerrada y que no puede esperar hasta el siguiente da hbil, puede llamar/localizar a su doctor(a) al nmero que aparece a continuacin.   Por favor, tenga en cuenta que aunque hacemos todo lo posible para estar disponibles para asuntos urgentes fuera del horario de oficina, no estamos disponibles las 24 horas del da, los 7 das de la semana.   Si tiene un problema urgente y no puede comunicarse con nosotros, puede optar por buscar atencin mdica  en el consultorio de su doctor(a), en una clnica privada, en un centro de atencin urgente o en una sala de emergencias.  Si tiene una emergencia mdica, por favor llame inmediatamente al 911 o vaya a la sala de emergencias.  Nmeros de bper  - Dr. Kowalski: [phone]  - Dra. Moye: [phone]  - Dra. Stewart: [phone]  En caso de inclemencias del tiempo, por favor llame a nuestra lnea principal al [phone] para una actualizacin sobre el estado de cualquier retraso o cierre.  Consejos para la medicacin en dermatologa: Por favor, guarde las cajas en las que vienen los medicamentos de uso tpico para ayudarle a seguir las instrucciones sobre dnde y cmo usarlos. Las farmacias generalmente imprimen las instrucciones del medicamento slo en las cajas y  no directamente en los tubos del medicamento.   Si su medicamento es muy caro, por favor, pngase en contacto con nuestra oficina llamando al [phone] y presione la opcin 4 o envenos un mensaje a travs de MyChart.   No podemos decirle cul ser su copago por los medicamentos por adelantado ya que esto es diferente dependiendo de la cobertura de su seguro. Sin embargo, es posible que podamos encontrar un medicamento sustituto a menor costo o llenar un formulario para que el seguro cubra el medicamento que se considera necesario.   Si se requiere una autorizacin previa para que su compaa de seguros cubra su medicamento, por favor permtanos de 1 a 2 das hbiles para completar este proceso.  Los precios de los medicamentos varan con frecuencia dependiendo del lugar de dnde se surte la receta y alguna farmacias pueden ofrecer precios ms baratos.  El sitio web www.goodrx.com tiene cupones para medicamentos de diferentes farmacias. Los precios aqu no tienen en cuenta lo que podra costar con la ayuda del seguro (  puede ser ms barato con su seguro), pero el sitio web puede darle el precio si no utiliz ningn seguro.  - Puede imprimir el cupn correspondiente y llevarlo con su receta a la farmacia.  - Tambin puede pasar por nuestra oficina durante el horario de atencin regular y recoger una tarjeta de cupones de GoodRx.  - Si necesita que su receta se enve electrnicamente a una farmacia diferente, informe a nuestra oficina a travs de MyChart de [place] o por telfono llamando al [phone] y presione la opcin 4.  

## 2022-08-22 NOTE — Progress Notes (Deleted)
Isotretinoin Follow-Up Visit   Subjective  Megan Bennett is a 37 y.o. female who presents for the following: Acne (1 month acne follow up, ) and hidradenitis suppurativa (1 month follow up, recent flare , )  Week # 28   Isotretinoin F/U - 08/22/22 1400       Isotretinoin Follow Up   iPledge # [id number]    Date 08/22/22    Weight 275 lb (124.7 kg)    Two Forms of Birth Control Tubal Sterilization    Acne breakouts since last visit? No      Dosage   Target Dosage (mg) 24940    Current (To Date) Dosage (mg) 10500    To Go Dosage (mg) 14440      Side Effects   Skin Chapped Lips;Dry Eyes;Dry Lips;Dry Nose;Dry Skin    Gastrointestinal WNL    Neurological WNL    Constitutional WNL             Side effects: Dry skin, dry lips  Denies changes in night vision, shortness of breath, abdominal pain, nausea, vomiting, diarrhea, blood in stool or urine, visual changes, headaches, epistaxis, joint pain, myalgias, mood changes, depression, or suicidal ideation.   Patient is not pregnant, not seeking pregnancy, and not breastfeeding.   The following portions of the chart were reviewed this encounter and updated as appropriate: medications, allergies, medical history  Review of Systems:  No other skin or systemic complaints except as noted in HPI or Assessment and Plan.  Objective  Well appearing patient in no apparent distress; mood and affect are within normal limits.  An examination of the face, neck, chest, and back was performed and relevant findings are noted below.     Assessment & Plan   Hidradenitis suppurativa axilllae, groin, thighs  Chronic and persistent condition with duration or expected duration over one year. Condition is symptomatic/ bothersome to patient. Not currently at goal.  Pt on isotretinoin with improvement.   Week 28 Total dose  10500 mg  Total mg/kg dose 84.20 mg/kg  Ipledge# [id number] [facility] in [place], [place]      Hidradenitis  Suppurativa is a chronic; persistent; non-curable, but treatable condition due to abnormal inflamed sweat glands in the body folds (axilla, inframammary, groin, medial thighs), causing recurrent painful draining cysts and scarring. It can be associated with severe scarring acne and cysts; also abscesses and scarring of scalp. The goal is control and prevention of flares, as it is not curable. Scars are permanent and can be thickened. Treatment may include daily use of topical medication and oral antibiotics.  Oral isotretinoin may also be helpful.  For more severe cases, Humira (a biologic injection) may be prescribed to decrease the inflammatory process and prevent flares.  When indicated, inflamed cysts may also be treated surgically.   Continue Isotretinoin 30 mg 2 po qd   Reviewed potential side effects of isotretinoin including xerosis, cheilitis, hepatitis, hyperlipidemia, and severe birth defects if taken by a pregnant woman. Reviewed reports of suicidal ideation in those with a history of depression while taking isotretinoin and reports of diagnosis of inflammatory bowl disease while taking isotretinoin as well as the lack of evidence for a causal relationship between isotretinoin, depression and IBD. Patient advised to reach out with any questions or concerns. Patient advised not to share pills or donate blood while on treatment or for one month after completing treatment.   Urine pregnancy test performed in office today and was negative.  Patient demonstrates comprehension and  confirms she will not get pregnant.  Lot [id number] Exp 07/31/2023   Ipledge# [id number] [facility] in [place], [place]    Two methods of BC include tubal ligation and female latex condom    Related Medications ISOtretinoin (ACCUTANE) 30 MG capsule Take 1 capsule (30 mg total) by mouth 2 (two) times daily.     Xerosis secondary to isotretinoin therapy - Continue emollients as directed - Xyzal (levocetirizine) once a  day and fish oil 1 gram daily may also help with dryness   Cheilitis secondary to isotretinoin therapy - Continue lip balm as directed, Dr. Luvenia Heller Cortibalm recommended   Long term medication management (isotretinoin) - While taking Isotretinoin and for 30 days after you finish the medication, do not get pregnant, do not share pills, do not donate blood. Isotretinoin is best absorbed when taken with a fatty meal. Isotretinoin can make you sensitive to the sun. Daily careful sun protection including sunscreen SPF 30+ when outdoors is recommended.  Follow-up in 30 days.  I,Cashae Weich A [place] Bryngelson,acting as a Education administrator for Sarina Ser, MD.,have documented all relevant documentation on the behalf of Sarina Ser, MD,as directed by  Sarina Ser, MD while in the presence of Sarina Ser, MD.  Documentation: I have reviewed the above documentation for accuracy and completeness, and I agree with the above.  Sarina Ser, MD

## 2022-08-24 ENCOUNTER — Encounter: Payer: Self-pay | Admitting: Dermatology

## 2022-09-03 ENCOUNTER — Ambulatory Visit: Payer: Medicaid Other | Admitting: Unknown Physician Specialty

## 2022-09-03 ENCOUNTER — Other Ambulatory Visit: Payer: Self-pay | Admitting: Unknown Physician Specialty

## 2022-09-03 VITALS — BP 98/70 | HR 115 | Temp 97.9°F | Ht 67.01 in | Wt 266.3 lb

## 2022-09-03 DIAGNOSIS — R059 Cough, unspecified: Secondary | ICD-10-CM | POA: Diagnosis not present

## 2022-09-03 DIAGNOSIS — R0602 Shortness of breath: Secondary | ICD-10-CM

## 2022-09-03 DIAGNOSIS — J4530 Mild persistent asthma, uncomplicated: Secondary | ICD-10-CM

## 2022-09-03 DIAGNOSIS — J4541 Moderate persistent asthma with (acute) exacerbation: Secondary | ICD-10-CM

## 2022-09-03 DIAGNOSIS — J45909 Unspecified asthma, uncomplicated: Secondary | ICD-10-CM | POA: Insufficient documentation

## 2022-09-03 DIAGNOSIS — J189 Pneumonia, unspecified organism: Secondary | ICD-10-CM | POA: Diagnosis not present

## 2022-09-03 MED ORDER — DOXYCYCLINE HYCLATE 100 MG PO TABS: 100.0000 mg | ORAL_TABLET | Freq: Two times a day (BID) | ORAL | 0 refills | Status: DC

## 2022-09-03 MED ORDER — PREDNISONE 20 MG PO TABS: 40.0000 mg | ORAL_TABLET | Freq: Every day | ORAL | 0 refills | Status: DC

## 2022-09-03 MED ORDER — BUDESONIDE-FORMOTEROL FUMARATE 160-4.5 MCG/ACT IN AERO: 2.0000 | INHALATION_SPRAY | Freq: Two times a day (BID) | RESPIRATORY_TRACT | 3 refills | Status: DC

## 2022-09-03 NOTE — Assessment & Plan Note (Signed)
Switch to Symbicort.  2 puffs BID for 2 weeks and then prn use instead of Albuterol

## 2022-09-03 NOTE — Progress Notes (Signed)
BP 98/70   Pulse (!) 115   Temp 97.9 F (36.6 C) (Oral)   Ht 5' 7.01" (1.702 m)   Wt 266 lb 4.8 oz (120.8 kg)   SpO2 98%   BMI 41.70 kg/m    Subjective:    Patient ID: Megan Bennett, female    DOB: [birth year], 37 y.o.   MRN: [id number]  HPI: Megan Bennett is a 37 y.o. female  Chief Complaint  Patient presents with   Cough   Hot Flashes   Shortness of Breath   Cough This is a new problem. The current episode started in the past 7 days. The problem has been unchanged. The problem occurs constantly. The cough is Non-productive. Associated symptoms include chest pain, chills, shortness of breath and wheezing. Pertinent negatives include no ear congestion, ear pain, fever, headaches, heartburn, hemoptysis, myalgias, nasal congestion, postnasal drip, rash, rhinorrhea, sore throat, sweats or weight loss. Nothing aggravates the symptoms. She has tried a beta-agonist inhaler for the symptoms. Her past medical history is significant for asthma. There is no history of bronchiectasis, bronchitis, emphysema or pneumonia.  Shortness of Breath Associated symptoms include chest pain and wheezing. Pertinent negatives include no ear pain, fever, headaches, hemoptysis, rash, rhinorrhea or sore throat. Her past medical history is significant for asthma. There is no history of pneumonia.   Took Albuterol x1 in the room but wasn't able to tell me how much she is taking right now.  Normally takes it 3x/week  Relevant past medical, surgical, family and social history reviewed and updated as indicated. Interim medical history since our last visit reviewed. Allergies and medications reviewed and updated.  Review of Systems  Constitutional:  Positive for chills. Negative for fever and weight loss.  HENT:  Negative for ear pain, postnasal drip, rhinorrhea and sore throat.   Respiratory:  Positive for cough, shortness of breath and wheezing. Negative for hemoptysis.   Cardiovascular:   Positive for chest pain.  Gastrointestinal:  Negative for heartburn.  Musculoskeletal:  Negative for myalgias.  Skin:  Negative for rash.  Neurological:  Negative for headaches.    Per HPI unless specifically indicated above     Objective:    BP 98/70   Pulse (!) 115   Temp 97.9 F (36.6 C) (Oral)   Ht 5' 7.01" (1.702 m)   Wt 266 lb 4.8 oz (120.8 kg)   SpO2 98%   BMI 41.70 kg/m   Wt Readings from Last 3 Encounters:  09/03/22 266 lb 4.8 oz (120.8 kg)  08/22/22 275 lb (124.7 kg)  07/22/22 270 lb 1.6 oz (122.5 kg)    Physical Exam Constitutional:      General: She is not in acute distress.    Appearance: She is well-developed.  HENT:     Head: Normocephalic and atraumatic.  Eyes:     General: Lids are normal. No scleral icterus.       Right eye: No discharge.        Left eye: No discharge.     Conjunctiva/sclera: Conjunctivae normal.  Neck:     Vascular: No carotid bruit or JVD.  Cardiovascular:     Rate and Rhythm: Normal rate and regular rhythm.     Heart sounds: Normal heart sounds.  Pulmonary:     Effort: No respiratory distress.     Breath sounds: Decreased air movement present.     Comments: Labored breathing.  Decreased breath sounds throughout Abdominal:     Palpations: There is no  hepatomegaly or splenomegaly.  Musculoskeletal:        General: Normal range of motion.     Cervical back: Normal range of motion and neck supple.  Skin:    General: Skin is warm and dry.     Coloration: Skin is not pale.     Findings: No rash.  Neurological:     Mental Status: She is alert and oriented to person, place, and time.  Psychiatric:        Behavior: Behavior normal.        Thought Content: Thought content normal.        Judgment: Judgment normal.     Results for orders placed or performed in visit on 07/22/22  Comp Met (CMET)  Result Value Ref Range   Glucose 119 (H) 70 - 99 mg/dL   BUN 12 6 - 20 mg/dL   Creatinine, Ser 0.79 0.57 - 1.00 mg/dL   eGFR 98  >59 mL/min/1.73   BUN/Creatinine Ratio 15 9 - 23   Sodium 139 134 - 144 mmol/L   Potassium 4.1 3.5 - 5.2 mmol/L   Chloride 104 96 - 106 mmol/L   CO2 17 (L) 20 - 29 mmol/L   Calcium 9.2 8.7 - 10.2 mg/dL   Total Protein 7.1 6.0 - 8.5 g/dL   Albumin 4.1 3.9 - 4.9 g/dL   Globulin, Total 3.0 1.5 - 4.5 g/dL   Albumin/Globulin Ratio 1.4 1.2 - 2.2   Bilirubin Total 0.5 0.0 - 1.2 mg/dL   Alkaline Phosphatase 114 44 - 121 IU/L   AST 15 0 - 40 IU/L   ALT 18 0 - 32 IU/L  TSH  Result Value Ref Range   TSH 3.410 0.450 - 4.500 uIU/mL  T4, free  Result Value Ref Range   Free T4 0.89 0.82 - 1.77 ng/dL  Lipid Profile  Result Value Ref Range   Cholesterol, Total 220 (H) 100 - 199 mg/dL   Triglycerides 269 (H) 0 - 149 mg/dL   HDL 39 (L) >39 mg/dL   VLDL Cholesterol Cal 48 (H) 5 - 40 mg/dL   LDL Chol Calc (NIH) 133 (H) 0 - 99 mg/dL   Chol/HDL Ratio 5.6 (H) 0.0 - 4.4 ratio      Assessment & Plan:   Problem List Items Addressed This Visit       Unprioritized   Asthma    Switch to Symbicort.  2 puffs BID for 2 weeks and then prn use instead of Albuterol      Relevant Medications   budesonide-formoterol (SYMBICORT) 160-4.5 MCG/ACT inhaler   predniSONE (DELTASONE) 20 MG tablet   Other Visit Diagnoses     Cough, unspecified type    -  Primary   secondary to asthma flare.  Pt with mild persistent underlying asthma.  She is having increasing SOB over the past week.  Give prednisone 40 mg daily for 5 d   Relevant Orders   Novel Coronavirus, NAA ([facility])   SOB (shortness of breath)       Due to asthma flare.  Nebulizer given and could hear bilateral lower crackles.  SOB improved   Moderate persistent asthma with exacerbation       Relevant Medications   budesonide-formoterol (SYMBICORT) 160-4.5 MCG/ACT inhaler   predniSONE (DELTASONE) 20 MG tablet   Atypical pneumonia       ? atypical due to daughter having pneumonial.  Rx for Doxycycline for 7 days..  Hold Isotretinoin        Follow  up plan: Return if symptoms worsen or fail to improve.

## 2022-09-03 NOTE — Assessment & Plan Note (Addendum)
Pt with mild persistent underlying asthma.  She is having increasing SOB over the past week.  Give prednisone 40 mg daily for 5 days.

## 2022-09-04 ENCOUNTER — Telehealth: Payer: Self-pay | Admitting: Nurse Practitioner

## 2022-09-04 NOTE — Telephone Encounter (Signed)
Unable to refill per protocol, Rx request is too soon. Last refill 09/03/22.  Requested Prescriptions  Pending Prescriptions Disp Refills   budesonide-formoterol (SYMBICORT) 160-4.5 MCG/ACT inhaler [Pharmacy Med Name: BUDESONIDE/FORM 160/4.5MCG(120 INH)] 10.2 g     Sig: INHALE 2 PUFFS INTO THE LUNGS TWICE DAILY FOR 2 WEEKS AND THEN CAN TAKE 2 PUFFS AS NEEDED FOR SHORTNESS OF BREATH( SAME WAY YOU TAKE ALBUTEROL)     Pulmonology:  Combination Products Passed - 09/03/2022  9:56 PM      Passed - Valid encounter within last 12 months    Recent Outpatient Visits           Yesterday Cough, unspecified type   [place] Kathrine Haddock, NP   1 month ago Acquired hypothyroidism   [place], Karen, NP   6 months ago Bilateral carpal tunnel syndrome   [place] Venita Lick, NP   7 months ago [place], NP   10 months ago [place] Jon Billings, NP       Future Appointments             In 3 weeks Ralene Bathe, MD [place]   In 4 months Jon Billings, NP [place]

## 2022-09-04 NOTE — Telephone Encounter (Signed)
Copied from [place] [phone]. Topic: General - Other >> Sep 03, 2022  6:01 PM Oley Balm A wrote: Reason for CRM: Katy with the pharmacy is calling to reach out to pt PCP. Pt was prescribed doxycycline (VIBRA-TABS) 100 MG tablet today and it is a counter act with her other medication. Please call the pharmacy back.

## 2022-09-04 NOTE — Telephone Encounter (Signed)
Called and spoke with the pharmacist. Advised them that according to the office visit note, patient is to hold the Accutane prescription while she takes the Doxycycline. Pharmacist states that he will also discuss this with the patient when she picks up the medication.

## 2022-09-05 LAB — NOVEL CORONAVIRUS, NAA: SARS-CoV-2, NAA: NOT DETECTED

## 2022-09-05 NOTE — Progress Notes (Signed)
Covid is negative.

## 2022-09-09 DIAGNOSIS — Z419 Encounter for procedure for purposes other than remedying health state, unspecified: Secondary | ICD-10-CM | POA: Diagnosis not present

## 2022-09-11 ENCOUNTER — Telehealth: Payer: Self-pay | Admitting: Urology

## 2022-09-11 NOTE — Telephone Encounter (Signed)
DOS - 10/11/22  METATARSAL OSTEOTOMY LEFT --- [id number] EXC BENIGN LESION LEFT --- [id number]  [facility] MEDICAID   RECEIVED FAX FROM [facility] MEDICAID STATING THAT FOR CPT CODES 82956 AND 21308 NO AUTH IS REQUIRED.

## 2022-09-25 ENCOUNTER — Ambulatory Visit: Payer: Medicaid Other | Admitting: Dermatology

## 2022-09-25 VITALS — BP 98/68 | HR 65 | Wt 266.0 lb

## 2022-09-25 DIAGNOSIS — L853 Xerosis cutis: Secondary | ICD-10-CM | POA: Diagnosis not present

## 2022-09-25 DIAGNOSIS — Z79899 Other long term (current) drug therapy: Secondary | ICD-10-CM

## 2022-09-25 DIAGNOSIS — K130 Diseases of lips: Secondary | ICD-10-CM | POA: Diagnosis not present

## 2022-09-25 DIAGNOSIS — L732 Hidradenitis suppurativa: Secondary | ICD-10-CM

## 2022-09-25 NOTE — Progress Notes (Signed)
Isotretinoin Follow-Up Visit   Subjective  Megan Bennett is a 37 y.o. female who presents for the following: Isotretinoin follow-up  Week # [address] [facility] in [place], [place] # [phone] Total mg -  12300 Total mg/kg - 102 Birth Control- tubal ligations and female latex condoms    Isotretinoin F/U - 09/25/22 1600       Isotretinoin Follow Up   iPledge # [id number]    Date 09/25/22    Weight 266 lb (120.7 kg)    Two Forms of Birth Control Tubal Sterilization    Acne breakouts since last visit? No      Dosage   Target Dosage (mg) 24940    Current (To Date) Dosage (mg) 12300    To Go Dosage (mg) 12640      Side Effects   Skin Chapped Lips;Dry Eyes;Dry Lips;Dry Nose;Dry Skin    Gastrointestinal WNL    Neurological WNL    Constitutional WNL            Side effects: Dry skin, dry lips  Patient is not pregnant, not seeking pregnancy, and not breastfeeding.  The following portions of the chart were reviewed this encounter and updated as appropriate: medications, allergies, medical history  Review of Systems:  No other skin or systemic complaints except as noted in HPI or Assessment and Plan.  Objective  Well appearing patient in no apparent distress; mood and affect are within normal limits.  An examination of the face, neck, chest, and back was performed and relevant findings are noted below.    Assessment & Plan   Hidradenitis suppurativa axilllae, groin, thighs   Chronic and persistent condition with duration or expected duration over one year. Condition is symptomatic/ bothersome to patient. Not currently at goal.  Pt on isotretinoin with improvement.  Recommend holding medication now due to foot surgery. Will reevaluate in 3 months.    Week 32 Total dose  12300 mg  Total mg/kg dose 102 mg/kg  Ipledge# [id number] [facility] in [place], [place]  Two methods of BC include tubal ligation and female latex condom   Hold Isotretinion 30 mg  capsule - take 1 capsule by mouth twice daily  will hold until after healed.    Hidradenitis Suppurativa is a chronic; persistent; non-curable, but treatable condition due to abnormal inflamed sweat glands in the body folds (axilla, inframammary, groin, medial thighs), causing recurrent painful draining cysts and scarring. It can be associated with severe scarring acne and cysts; also abscesses and scarring of scalp. The goal is control and prevention of flares, as it is not curable. Scars are permanent and can be thickened. Treatment may include daily use of topical medication and oral antibiotics.  Oral isotretinoin may also be helpful.  For more severe cases, Humira (a biologic injection) may be prescribed to decrease the inflammatory process and prevent flares.  When indicated, inflamed cysts may also be treated surgically.   Reviewed potential side effects of isotretinoin including xerosis, cheilitis, hepatitis, hyperlipidemia, and severe birth defects if taken by a pregnant woman. Reviewed reports of suicidal ideation in those with a history of depression while taking isotretinoin and reports of diagnosis of inflammatory bowl disease while taking isotretinoin as well as the lack of evidence for a causal relationship between isotretinoin, depression and IBD. Patient advised to reach out with any questions or concerns. Patient advised not to share pills or donate blood while on treatment or for one month after completing treatment.   Urine pregnancy test  performed in office today and was negative.  Patient demonstrates comprehension and confirms she will not get pregnant.    Xerosis secondary to isotretinoin therapy - Continue emollients as directed - Xyzal (levocetirizine) once a day and fish oil 1 gram daily may also help with dryness  Cheilitis secondary to isotretinoin therapy - Continue lip balm as directed, Dr. Clayborne Artist Cortibalm recommended  Long term medication management (isotretinoin) -  While taking Isotretinoin and for 30 days after you finish the medication, do not get pregnant, do not share pills, do not donate blood. Isotretinoin is best absorbed when taken with a fatty meal. Isotretinoin can make you sensitive to the sun. Daily careful sun protection including sunscreen SPF 30+ when outdoors is recommended.  Follow-up in 30 days.  IAsher Muir, CMA, am acting as scribe for Armida Sans, MD.  Documentation: I have reviewed the above documentation for accuracy and completeness, and I agree with the above.  Armida Sans, MD

## 2022-09-25 NOTE — Patient Instructions (Addendum)
While taking Isotretinoin and for 30 days after you finish the medication, do not get pregnant, do not share pills, do not donate blood.  Generic isotretinoin is best absorbed when taken with a fatty meal. Isotretinoin can make you sensitive to the sun. Daily careful sun protection including sunscreen SPF 30+ when outdoors is recommended.    Due to recent changes in healthcare laws, you may see results of your pathology and/or laboratory studies on MyChart before the doctors have had a chance to review them. We understand that in some cases there may be results that are confusing or concerning to you. Please understand that not all results are received at the same time and often the doctors may need to interpret multiple results in order to provide you with the best plan of care or course of treatment. Therefore, we ask that you please give us 2 business days to thoroughly review all your results before contacting the office for clarification. Should we see a critical lab result, you will be contacted sooner.   If You Need Anything After Your Visit  If you have any questions or concerns for your doctor, please call our main line at [phone] and press option 4 to reach your doctor's medical assistant. If no one answers, please leave a voicemail as directed and we will return your call as soon as possible. Messages left after 4 pm will be answered the following business day.   You may also send us a message via MyChart. We typically respond to MyChart messages within 1-2 business days.  For prescription refills, please ask your pharmacy to contact our office. Our fax number is [phone].  If you have an urgent issue when the clinic is closed that cannot wait until the next business day, you can page your doctor at the number below.    Please note that while we do our best to be available for urgent issues outside of office hours, we are not available 24/7.   If you have an urgent issue and are  unable to reach us, you may choose to seek medical care at your doctor's office, retail clinic, urgent care center, or emergency room.  If you have a medical emergency, please immediately call 911 or go to the emergency department.  Pager Numbers  - Dr. Kowalski: [phone]  - Dr. Moye: [phone]  - Dr. Stewart: [phone]  In the event of inclement weather, please call our main line at [phone] for an update on the status of any delays or closures.  Dermatology Medication Tips: Please keep the boxes that topical medications come in in order to help keep track of the instructions about where and how to use these. Pharmacies typically print the medication instructions only on the boxes and not directly on the medication tubes.   If your medication is too expensive, please contact our office at [phone] option 4 or send us a message through MyChart.   We are unable to tell what your co-pay for medications will be in advance as this is different depending on your insurance coverage. However, we may be able to find a substitute medication at lower cost or fill out paperwork to get insurance to cover a needed medication.   If a prior authorization is required to get your medication covered by your insurance company, please allow us 1-2 business days to complete this process.  Drug prices often vary depending on where the prescription is filled and some pharmacies may offer cheaper prices.  The   website www.goodrx.com contains coupons for medications through different pharmacies. The prices here do not account for what the cost may be with help from insurance (it may be cheaper with your insurance), but the website can give you the price if you did not use any insurance.  - You can print the associated coupon and take it with your prescription to the pharmacy.  - You may also stop by our office during regular business hours and pick up a GoodRx coupon card.  - If you need your  prescription sent electronically to a different pharmacy, notify our office through [facility] MyChart or by phone at [phone] option 4.     Si Usted Necesita Algo Despus de Su Visita  Tambin puede enviarnos un mensaje a travs de MyChart. Por lo general respondemos a los mensajes de MyChart en el transcurso de 1 a 2 das hbiles.  Para renovar recetas, por favor pida a su farmacia que se ponga en contacto con nuestra oficina. Nuestro nmero de fax es el [phone].  Si tiene un asunto urgente cuando la clnica est cerrada y que no puede esperar hasta el siguiente da hbil, puede llamar/localizar a su doctor(a) al nmero que aparece a continuacin.   Por favor, tenga en cuenta que aunque hacemos todo lo posible para estar disponibles para asuntos urgentes fuera del horario de oficina, no estamos disponibles las 24 horas del da, los 7 das de la semana.   Si tiene un problema urgente y no puede comunicarse con nosotros, puede optar por buscar atencin mdica  en el consultorio de su doctor(a), en una clnica privada, en un centro de atencin urgente o en una sala de emergencias.  Si tiene una emergencia mdica, por favor llame inmediatamente al 911 o vaya a la sala de emergencias.  Nmeros de bper  - Dr. Kowalski: [phone]  - Dra. Moye: [phone]  - Dra. Stewart: [phone]  En caso de inclemencias del tiempo, por favor llame a nuestra lnea principal al [phone] para una actualizacin sobre el estado de cualquier retraso o cierre.  Consejos para la medicacin en dermatologa: Por favor, guarde las cajas en las que vienen los medicamentos de uso tpico para ayudarle a seguir las instrucciones sobre dnde y cmo usarlos. Las farmacias generalmente imprimen las instrucciones del medicamento slo en las cajas y no directamente en los tubos del medicamento.   Si su medicamento es muy caro, por favor, pngase en contacto con nuestra oficina llamando al [phone]  y presione la opcin 4 o envenos un mensaje a travs de MyChart.   No podemos decirle cul ser su copago por los medicamentos por adelantado ya que esto es diferente dependiendo de la cobertura de su seguro. Sin embargo, es posible que podamos encontrar un medicamento sustituto a menor costo o llenar un formulario para que el seguro cubra el medicamento que se considera necesario.   Si se requiere una autorizacin previa para que su compaa de seguros cubra su medicamento, por favor permtanos de 1 a 2 das hbiles para completar este proceso.  Los precios de los medicamentos varan con frecuencia dependiendo del lugar de dnde se surte la receta y alguna farmacias pueden ofrecer precios ms baratos.  El sitio web www.goodrx.com tiene cupones para medicamentos de diferentes farmacias. Los precios aqu no tienen en cuenta lo que podra costar con la ayuda del seguro (puede ser ms barato con su seguro), pero el sitio web puede darle el precio si no utiliz ningn seguro.  - Puede   imprimir el cupn correspondiente y llevarlo con su receta a la farmacia.  - Tambin puede pasar por nuestra oficina durante el horario de atencin regular y recoger una tarjeta de cupones de GoodRx.  - Si necesita que su receta se enve electrnicamente a una farmacia diferente, informe a nuestra oficina a travs de MyChart de [place] o por telfono llamando al [phone] y presione la opcin 4.  

## 2022-10-07 ENCOUNTER — Encounter: Payer: Self-pay | Admitting: Dermatology

## 2022-10-09 DIAGNOSIS — Z419 Encounter for procedure for purposes other than remedying health state, unspecified: Secondary | ICD-10-CM | POA: Diagnosis not present

## 2022-10-16 ENCOUNTER — Encounter: Payer: Medicaid Other | Admitting: Podiatry

## 2022-10-18 ENCOUNTER — Other Ambulatory Visit: Payer: Self-pay | Admitting: Podiatry

## 2022-10-18 DIAGNOSIS — M21622 Bunionette of left foot: Secondary | ICD-10-CM | POA: Diagnosis not present

## 2022-10-18 DIAGNOSIS — L859 Epidermal thickening, unspecified: Secondary | ICD-10-CM | POA: Diagnosis not present

## 2022-10-18 DIAGNOSIS — D2372 Other benign neoplasm of skin of left lower limb, including hip: Secondary | ICD-10-CM | POA: Diagnosis not present

## 2022-10-18 DIAGNOSIS — D492 Neoplasm of unspecified behavior of bone, soft tissue, and skin: Secondary | ICD-10-CM | POA: Diagnosis not present

## 2022-10-18 MED ORDER — OXYCODONE HCL 5 MG PO TABS: 5.0000 mg | ORAL_TABLET | Freq: Four times a day (QID) | ORAL | 0 refills | Status: DC | PRN

## 2022-10-18 MED ORDER — GABAPENTIN 300 MG PO CAPS: 300.0000 mg | ORAL_CAPSULE | Freq: Every day | ORAL | 0 refills | Status: DC

## 2022-10-18 MED ORDER — ACETAMINOPHEN 500 MG PO TABS: 1000.0000 mg | ORAL_TABLET | Freq: Four times a day (QID) | ORAL | 0 refills | Status: AC | PRN

## 2022-10-18 MED ORDER — IBUPROFEN 800 MG PO TABS: 800.0000 mg | ORAL_TABLET | Freq: Three times a day (TID) | ORAL | 1 refills | Status: DC | PRN

## 2022-10-18 NOTE — Progress Notes (Signed)
5/10 left fifth metatarsal osteotomy, excision of benign lesion

## 2022-10-21 ENCOUNTER — Telehealth: Payer: Self-pay | Admitting: Podiatry

## 2022-10-21 ENCOUNTER — Telehealth: Payer: Self-pay

## 2022-10-21 MED ORDER — OXYCODONE HCL 5 MG PO TABS: 5.0000 mg | ORAL_TABLET | ORAL | 0 refills | Status: AC | PRN

## 2022-10-21 NOTE — Telephone Encounter (Signed)
Error

## 2022-10-21 NOTE — Telephone Encounter (Signed)
Pt called needing a refill on pain medication, she is out. She said she spoke to you over the weekend and you told her to take them 4 hrs apart and she did so that is why she is out. Please send it in asap as she is in a lot of pain.

## 2022-10-22 ENCOUNTER — Encounter: Payer: Self-pay | Admitting: Nurse Practitioner

## 2022-10-23 ENCOUNTER — Encounter: Payer: Self-pay | Admitting: Podiatry

## 2022-10-23 ENCOUNTER — Ambulatory Visit (INDEPENDENT_AMBULATORY_CARE_PROVIDER_SITE_OTHER): Payer: Medicaid Other

## 2022-10-23 ENCOUNTER — Ambulatory Visit (INDEPENDENT_AMBULATORY_CARE_PROVIDER_SITE_OTHER): Payer: Medicaid Other | Admitting: Podiatry

## 2022-10-23 DIAGNOSIS — Q828 Other specified congenital malformations of skin: Secondary | ICD-10-CM

## 2022-10-23 DIAGNOSIS — M216X2 Other acquired deformities of left foot: Secondary | ICD-10-CM

## 2022-10-23 NOTE — Progress Notes (Signed)
  Subjective:  Patient ID: Megan Bennett, female    DOB: Apr 06, 1986,  MRN: [id number]  Chief Complaint  Patient presents with   Routine Post Op    POV #1 DOS 10/18/2022 EXCISION OF LEFT FOOT LESION AND 5TH METATARSAL BONE CUT     37 y.o. female returns for post-op check.  Having quite a bit of pain, helping to take the boot off  Review of Systems: Negative except as noted in the HPI. Denies N/V/F/Ch.   Objective:  There were no vitals filed for this visit. There is no height or weight on file to calculate BMI. Constitutional Well developed. Well nourished.  Vascular Foot warm and well perfused. Capillary refill normal to all digits.  Calf is soft and supple, no posterior calf or knee pain, negative Homans' sign  Neurologic Normal speech. Oriented to person, place, and time. Epicritic sensation to light touch grossly present bilaterally.  Dermatologic Skin healing well without signs of infection. Skin edges well coapted without signs of infection.  Orthopedic: Tenderness to palpation noted about the surgical site.   Multiple view plain film radiographs: Good correction of osteotomy noted, Kirschner wire intact and in good position Assessment:   1. Plantar flexed metatarsal, left   2. Porokeratosis    Plan:  Patient was evaluated and treated and all questions answered.  S/p foot surgery left -Progressing as expected post-operatively. -XR: Noted above no complication -WB Status: Weightbearing to heel in cam walker boot -Sutures: Removed in 2 weeks. -Foot redressed.  Maintain dressing until next visit  Return in about 2 weeks (around 11/06/2022) for suture removal, pin removal.

## 2022-10-29 ENCOUNTER — Other Ambulatory Visit: Payer: Self-pay | Admitting: Podiatry

## 2022-10-30 ENCOUNTER — Encounter: Payer: Medicaid Other | Admitting: Podiatry

## 2022-11-01 ENCOUNTER — Telehealth: Payer: Self-pay | Admitting: Podiatry

## 2022-11-01 MED ORDER — OXYCODONE HCL 5 MG PO TABS: 5.0000 mg | ORAL_TABLET | Freq: Three times a day (TID) | ORAL | 0 refills | Status: AC | PRN

## 2022-11-01 NOTE — Telephone Encounter (Signed)
Patient called this morning stating that last night, her daughter spilled water, which got on her surgical boot, so she had to take it off and walk on her heel to the bathroom to get it cleaned up.  She has had significant pain ever since then and is asking if you'll call in more oxycodone for her so that she can get relief and get some sleep.  Advised her that you would be notified, but that you were in surgery and may not be able to respond until end of day.  She said that was fine.    -Megan Bennett

## 2022-11-01 NOTE — Telephone Encounter (Signed)
Rx has been sent and I sent her a MyChart message confirming this and to go ahead and change the dressing with fresh dry gauze

## 2022-11-01 NOTE — Addendum Note (Signed)
Addended byLilian Kapur, [place] R on: 11/01/2022 03:11 PM   Modules accepted: Orders

## 2022-11-06 ENCOUNTER — Ambulatory Visit (INDEPENDENT_AMBULATORY_CARE_PROVIDER_SITE_OTHER): Payer: Medicaid Other | Admitting: Podiatry

## 2022-11-06 ENCOUNTER — Ambulatory Visit (INDEPENDENT_AMBULATORY_CARE_PROVIDER_SITE_OTHER): Payer: Medicaid Other

## 2022-11-06 DIAGNOSIS — Q828 Other specified congenital malformations of skin: Secondary | ICD-10-CM

## 2022-11-06 DIAGNOSIS — M216X2 Other acquired deformities of left foot: Secondary | ICD-10-CM | POA: Diagnosis not present

## 2022-11-08 ENCOUNTER — Encounter: Payer: Self-pay | Admitting: Podiatry

## 2022-11-08 NOTE — Progress Notes (Signed)
  Subjective:  Patient ID: Megan Bennett, female    DOB: 25-May-1986,  MRN: [id number]  Chief Complaint  Patient presents with   Routine Post Op    POV #2 DOS 10/18/2022 EXCISION OF LEFT FOOT LESION AND 5TH METATARSAL BONE CUT     37 y.o. female returns for post-op check.  Over the weekend felt something pop when she stepped on it  Review of Systems: Negative except as noted in the HPI. Denies N/V/F/Ch.   Objective:  There were no vitals filed for this visit. There is no height or weight on file to calculate BMI. Constitutional Well developed. Well nourished.  Vascular Foot warm and well perfused. Capillary refill normal to all digits.  Calf is soft and supple, no posterior calf or knee pain, negative Homans' sign  Neurologic Normal speech. Oriented to person, place, and time. Epicritic sensation to light touch grossly present bilaterally.  Dermatologic Incision is well-healed  Orthopedic: Tenderness to palpation noted about the surgical site.   Multiple view plain film radiographs: Good correction of osteotomy noted, Kirschner wire intact and in good position, no complication from the injury Assessment:   1. Plantar flexed metatarsal, left   2. Porokeratosis    Plan:  Patient was evaluated and treated and all questions answered.  S/p foot surgery left -All sutures removed.  Kirschner wire removed uneventfully.  Band-Aid applied.  She may resume regular bathing.  Continue WBAT in CAM boot.  Return in 3 weeks for new radiographs if the osteotomy is healing may transition back to regular shoe gear  Return in about 3 weeks (around 11/27/2022), or post op with Dr Allena Katz (6/18 or 6/20).

## 2022-11-09 DIAGNOSIS — Z419 Encounter for procedure for purposes other than remedying health state, unspecified: Secondary | ICD-10-CM | POA: Diagnosis not present

## 2022-11-12 ENCOUNTER — Other Ambulatory Visit: Payer: Self-pay | Admitting: Podiatry

## 2022-11-20 ENCOUNTER — Encounter: Payer: Medicaid Other | Admitting: Podiatry

## 2022-11-23 ENCOUNTER — Other Ambulatory Visit: Payer: Self-pay | Admitting: Podiatry

## 2022-11-26 ENCOUNTER — Ambulatory Visit (INDEPENDENT_AMBULATORY_CARE_PROVIDER_SITE_OTHER): Payer: Medicaid Other | Admitting: Podiatry

## 2022-11-26 ENCOUNTER — Ambulatory Visit (INDEPENDENT_AMBULATORY_CARE_PROVIDER_SITE_OTHER): Payer: Medicaid Other

## 2022-11-26 DIAGNOSIS — M216X2 Other acquired deformities of left foot: Secondary | ICD-10-CM | POA: Diagnosis not present

## 2022-11-26 NOTE — Progress Notes (Signed)
  Subjective:  Patient ID: Megan Bennett, female    DOB: [birth year],  MRN: [id number]  No chief complaint on file.    37 y.o. female returns for post-op check.  She denies any other acute complaints is feeling much better was given like to come out of the boot if she can  Review of Systems: Negative except as noted in the HPI. Denies N/V/F/Ch.   Objective:  There were no vitals filed for this visit. There is no height or weight on file to calculate BMI. Constitutional Well developed. Well nourished.  Vascular Foot warm and well perfused. Capillary refill normal to all digits.  Calf is soft and supple, no posterior calf or knee pain, negative Homans' sign  Neurologic Normal speech. Oriented to person, place, and time. Epicritic sensation to light touch grossly present bilaterally.  Dermatologic Incision is well-healed  Orthopedic: Tenderness to palpation noted about the surgical site.   Multiple view plain film radiographs: Good correction of osteotomy noted, Kirschner wire intact and in good position, no complication from the injury Assessment:   1. Plantar flexed metatarsal, left    Plan:  Patient was evaluated and treated and all questions answered.  S/p foot surgery left -All sutures removed.  Kirschner wire removed uneventfully.  Band-Aid applied.  She may resume regular bathing.  Continue WBAT in CAM boot.  New radiographs were taken and ostomy appears to be stable.  Some callus formation noted. -Patient can return to weightbearing as tolerated in regular shoe.  No follow-ups on file.

## 2022-12-06 ENCOUNTER — Encounter: Payer: Self-pay | Admitting: Podiatry

## 2022-12-09 DIAGNOSIS — Z419 Encounter for procedure for purposes other than remedying health state, unspecified: Secondary | ICD-10-CM | POA: Diagnosis not present

## 2023-01-09 ENCOUNTER — Ambulatory Visit: Payer: Medicaid Other | Admitting: Dermatology

## 2023-01-09 DIAGNOSIS — Z419 Encounter for procedure for purposes other than remedying health state, unspecified: Secondary | ICD-10-CM | POA: Diagnosis not present

## 2023-01-13 ENCOUNTER — Encounter: Payer: Medicaid Other | Admitting: Podiatry

## 2023-01-18 ENCOUNTER — Other Ambulatory Visit: Payer: Self-pay | Admitting: Nurse Practitioner

## 2023-01-21 ENCOUNTER — Encounter: Payer: Medicaid Other | Admitting: Physician Assistant

## 2023-01-21 NOTE — Telephone Encounter (Signed)
Requested Prescriptions  Pending Prescriptions Disp Refills   DULoxetine (CYMBALTA) 60 MG capsule [Pharmacy Med Name: DULOXETINE DR 60MG  CAPSULES] 90 capsule 1    Sig: TAKE 1 CAPSULE(60 MG) BY MOUTH DAILY     Psychiatry: Antidepressants - SNRI - duloxetine Passed - 01/18/2023  1:27 PM      Passed - Cr in normal range and within 360 days    Creatinine, Ser  Date Value Ref Range Status  07/22/2022 0.79 0.57 - 1.00 mg/dL Final   Creatinine, Urine  Date Value Ref Range Status  06/07/2018 16 mg/dL Final         Passed - eGFR is 30 or above and within 360 days    GFR calc Af Amer  Date Value Ref Range Status  08/10/2018 >60 >60 mL/min Final   GFR calc non Af Amer  Date Value Ref Range Status  08/10/2018 >60 >60 mL/min Final   eGFR  Date Value Ref Range Status  07/22/2022 98 >59 mL/min/1.73 Final         Passed - Completed PHQ-2 or PHQ-9 in the last 360 days      Passed - Last BP in normal range    BP Readings from Last 1 Encounters:  09/25/22 98/68         Passed - Valid encounter within last 6 months    Recent Outpatient Visits           4 months ago Cough, unspecified type   [facility] Gabriel Cirri, NP   6 months ago Acquired hypothyroidism   [facility] Larae Grooms, NP   11 months ago Bilateral carpal tunnel syndrome   [facility] Marjie Skiff, NP   1 year ago Anxiety   [facility] Larae Grooms, NP   1 year ago Anxiety   [facility] Larae Grooms, NP

## 2023-01-23 ENCOUNTER — Encounter: Payer: Self-pay | Admitting: Nurse Practitioner

## 2023-02-09 DIAGNOSIS — Z419 Encounter for procedure for purposes other than remedying health state, unspecified: Secondary | ICD-10-CM | POA: Diagnosis not present

## 2023-03-05 ENCOUNTER — Encounter: Payer: Medicaid Other | Admitting: Pediatrics

## 2023-03-05 NOTE — Progress Notes (Deleted)
There were no vitals taken for this visit.   Annual Physical Exam   Subjective:   No chief complaint on file.   Megan Bennett is a 37 y.o. female patient here for a preventative health maintenance exam{Blank Single :19197::" and has no acute complaints.",". Additional topics discussed include:"}  Relevant Gynecologic History LMP: No LMP recorded. Patient has had an ablation.  Menstrual Status: {Menopause:31378}, Flow {Misc; menses description:16152} PAP History:  No Cervical Cancer Screening results to display.  History abnormal PAP: {Yes/No w/ pre-defaulted No:34644::"No"}  Sexual activity: {sexual partners:315163} Family history breast, ovarian cancer: {yes/no:20286} Domestic Violence Screen: {yes/no:20286}  Health Habits: DIET: {Desc; diets:16563} EXERCISE: *** times/week on average, activities include {misc; exercise types:16438} DENTAL EXAM: {UTDSTATUS:31041} EYE EXAM: {UTDSTATUS:31041}                       Social History   Tobacco Use   Smoking status: Every Day    Current packs/day: 0.50    Average packs/day: 0.5 packs/day for 20.0 years (10.0 ttl pk-yrs)    Types: Cigarettes   Smokeless tobacco: Never  Vaping Use   Vaping status: Never Used  Substance Use Topics   Alcohol use: Yes    Alcohol/week: 1.0 standard drink of alcohol    Types: 1 Glasses of wine per week    Comment: 3-4 times a week   Drug use: Never   Social History   Social History Narrative   Not on file    Social drivers questionnaire is reviewed and is positive for : {SDOH Challenges:24934}. Follow up: {sdoh follow up:67204::"None"}  Depression Screening:  PHQ-2      PHQ-9 (if PHQ >=3)    Depression Severity and Treatment Recommendations:  0-4= None  5-9= Mild / Treatment: Support, educate to call if worse; return in one month  10-14= Moderate / Treatment: Support, watchful waiting; Antidepressant or Psychotherapy  15-19= Moderately severe / Treatment:  Antidepressant OR Psychotherapy  >= 20= Major depression, severe / Antidepressant AND Psychotherapy  Depression Screen and follow up : {MU2 Types of Depression Screening Tests:42127} {MU2 Actions for (+) Depression Screen:42125}  Self Management Goals  Goals   None     Health Maintenance Colon Cancer Screening : {UTDSTATUS:31041} Mammogram : {UTDSTATUS:31041} DXA scan : {UTDSTATUS:31041} Immunizations : {Immunizations:5306}  Review of Systems See HPI for relevant ROS.  Outpatient Medications Prior to Visit  Medication Sig Dispense Refill   budesonide-formoterol (SYMBICORT) 160-4.5 MCG/ACT inhaler Inhale 2 puffs into the lungs 2 (two) times daily. Take 2 puffs bid for 2 weeks and then can take 2 puff prn SOB in the same way you took Albuterol 1 each 3   diphenhydrAMINE (BENADRYL) 25 mg capsule Take 25 mg by mouth as needed for allergies.     DULoxetine (CYMBALTA) 60 MG capsule TAKE 1 CAPSULE(60 MG) BY MOUTH DAILY 90 capsule 0   fluticasone (FLONASE) 50 MCG/ACT nasal spray Place 2 sprays into both nostrils daily. 16 g 0   gabapentin (NEURONTIN) 300 MG capsule TAKE 1 CAPSULE(300 MG) BY MOUTH AT BEDTIME FOR 14 DAYS 14 capsule 0   ibuprofen (ADVIL) 800 MG tablet Take 1 tablet (800 mg total) by mouth every 8 (eight) hours as needed. 90 tablet 1   ISOtretinoin (ACCUTANE) 30 MG capsule Take 1 capsule (30 mg total) by mouth 2 (two) times daily. 60 capsule 0   levothyroxine (SYNTHROID) 25 MCG tablet TAKE 1 TABLET(25 MCG) BY MOUTH DAILY 90 tablet 3   polyethylene glycol (MIRALAX)  17 g packet Take 17 g by mouth daily. 14 each 1   SUMAtriptan (IMITREX) 100 MG tablet Take 1 tablet (100 mg total) by mouth daily as needed. 10 tablet 1   triamcinolone cream (KENALOG) 0.1 % Apply 1 Application topically 2 (two) times daily. Apply to affected areas twice daily. Avoid applying to face, groin, and axilla. Use as directed. Long-term use can cause thinning of the skin. 80 g 1   No facility-administered  medications prior to visit.     Patient Active Problem List   Diagnosis Date Noted   Asthma 09/03/2022   Bilateral carpal tunnel syndrome 02/25/2022   Acquired hypothyroidism 08/13/2021   Anxiety 08/13/2021   Fibromyalgia 03/19/2021   Nicotine dependence, cigarettes, uncomplicated 08/29/2020   S/P laparoscopic surgery 08/04/2018    Objective:   There were no vitals filed for this visit.  There is no height or weight on file to calculate BMI.  Physical Exam  ***  {Chaperone- Choose all applicable exam types:71567}  Assessment and Plan:   Diagnoses and all orders for this visit:  Annual physical exam  Screening for cervical cancer  Need for vaccination  Lipid screening  Diabetes mellitus screening    This plan was discussed with the patient and questions were answered. There were no further concerns.  Follow up as indicated, or sooner should any new problems arise, if conditions worsen, or if they are otherwise concerned.   See patient instructions for additional information.  Trayden Brandy Howell Pringle, MD  Family Medicine      Future Appointments  Date Time Provider Department Center  03/05/2023 11:20 AM Jackolyn Confer, MD [facility]

## 2023-03-07 ENCOUNTER — Encounter: Payer: Medicaid Other | Admitting: Pediatrics

## 2023-03-11 DIAGNOSIS — Z419 Encounter for procedure for purposes other than remedying health state, unspecified: Secondary | ICD-10-CM | POA: Diagnosis not present

## 2023-03-16 ENCOUNTER — Telehealth: Payer: Medicaid Other | Admitting: Family

## 2023-03-16 ENCOUNTER — Telehealth: Payer: Medicaid Other

## 2023-03-16 DIAGNOSIS — B9689 Other specified bacterial agents as the cause of diseases classified elsewhere: Secondary | ICD-10-CM | POA: Diagnosis not present

## 2023-03-16 DIAGNOSIS — J019 Acute sinusitis, unspecified: Secondary | ICD-10-CM

## 2023-03-16 MED ORDER — DOXYCYCLINE HYCLATE 100 MG PO TABS
100.0000 mg | ORAL_TABLET | Freq: Two times a day (BID) | ORAL | 0 refills | Status: DC
Start: 2023-03-16 — End: 2023-04-08

## 2023-03-16 NOTE — Progress Notes (Signed)
Virtual Visit Consent   Megan Bennett, you are scheduled for a virtual visit with a Megan Bennett provider today. Just as with appointments in the office, your consent must be obtained to participate. Your consent will be active for this visit and any virtual visit you may have with one of our providers in the next 365 days. If you have a MyChart account, a copy of this consent can be sent to you electronically.  As this is a virtual visit, video technology does not allow for your provider to perform a traditional examination. This may limit your provider's ability to fully assess your condition. If your provider identifies any concerns that need to be evaluated in person or the need to arrange testing (such as labs, EKG, etc.), we will make arrangements to do so. Although advances in technology are sophisticated, we cannot ensure that it will always work on either your end or our end. If the connection with a video visit is poor, the visit may have to be switched to a telephone visit. With either a video or telephone visit, we are not always able to ensure that we have a secure connection.  By engaging in this virtual visit, you consent to the provision of healthcare and authorize for your insurance to be billed (if applicable) for the services provided during this visit. Depending on your insurance coverage, you may receive a charge related to this service.  I need to obtain your verbal consent now. Are you willing to proceed with your visit today? Megan Bennett has provided verbal consent on 03/16/2023 for a virtual visit (video or telephone). Megan Rodney, FNP  Date: 03/16/2023 2:02 PM  Virtual Visit via Video Note   I, Megan Bennett, connected with  Megan Bennett  ([id number], [birth year]) on 03/16/23 at  2:00 PM EDT by a video-enabled telemedicine application and verified that I am speaking with the correct person using two identifiers.  Location: Patient: Virtual Visit  Location Patient: Other: car Provider: Virtual Visit Location Provider: Home Office   I discussed the limitations of evaluation and management by telemedicine and the availability of in person appointments. The patient expressed understanding and agreed to proceed.    History of Present Illness: Megan Bennett is a 37 y.o. who identifies as a female who was assigned female at birth, and is being seen today for sinus pressure and cough.  HPI: Sinusitis This is a new problem. The current episode started 1 to 4 weeks ago. The problem has been gradually worsening since onset. There has been no fever. Her pain is at a severity of 6/10. The pain is moderate. Associated symptoms include congestion, coughing, headaches, sinus pressure, sneezing and a sore throat. Pertinent negatives include no ear pain. Past treatments include oral decongestants and acetaminophen. The treatment provided mild relief.    Problems:  Patient Active Problem List   Diagnosis Date Noted   Asthma 09/03/2022   Bilateral carpal tunnel syndrome 02/25/2022   Acquired hypothyroidism 08/13/2021   Anxiety 08/13/2021   Fibromyalgia 03/19/2021   Nicotine dependence, cigarettes, uncomplicated 08/29/2020   S/P laparoscopic surgery 08/04/2018    Allergies:  Allergies  Allergen Reactions   Amoxicillin Rash    Did it involve swelling of the face/tongue/throat, SOB, or low BP? No Did it involve sudden or severe rash/hives, skin peeling, or any reaction on the inside of your mouth or nose? No Did you need to seek medical attention at a hospital or doctor's office? No When did  it last happen?      14 + years If all above answers are "NO", may proceed with cephalosporin use.,    Hydrocodone Nausea And Vomiting   Wellbutrin [Bupropion] Other (See Comments)    shakey   Medications:  Current Outpatient Medications:    doxycycline (VIBRA-TABS) 100 MG tablet, Take 1 tablet (100 mg total) by mouth 2 (two) times daily., Disp:  20 tablet, Rfl: 0   budesonide-formoterol (SYMBICORT) 160-4.5 MCG/ACT inhaler, Inhale 2 puffs into the lungs 2 (two) times daily. Take 2 puffs bid for 2 weeks and then can take 2 puff prn SOB in the same way you took Albuterol, Disp: 1 each, Rfl: 3   diphenhydrAMINE (BENADRYL) 25 mg capsule, Take 25 mg by mouth as needed for allergies., Disp: , Rfl:    DULoxetine (CYMBALTA) 60 MG capsule, TAKE 1 CAPSULE(60 MG) BY MOUTH DAILY, Disp: 90 capsule, Rfl: 0   fluticasone (FLONASE) 50 MCG/ACT nasal spray, Place 2 sprays into both nostrils daily., Disp: 16 g, Rfl: 0   gabapentin (NEURONTIN) 300 MG capsule, TAKE 1 CAPSULE(300 MG) BY MOUTH AT BEDTIME FOR 14 DAYS, Disp: 14 capsule, Rfl: 0   ibuprofen (ADVIL) 800 MG tablet, Take 1 tablet (800 mg total) by mouth every 8 (eight) hours as needed., Disp: 90 tablet, Rfl: 1   levothyroxine (SYNTHROID) 25 MCG tablet, TAKE 1 TABLET(25 MCG) BY MOUTH DAILY, Disp: 90 tablet, Rfl: 3   polyethylene glycol (MIRALAX) 17 g packet, Take 17 g by mouth daily., Disp: 14 each, Rfl: 1   SUMAtriptan (IMITREX) 100 MG tablet, Take 1 tablet (100 mg total) by mouth daily as needed., Disp: 10 tablet, Rfl: 1   triamcinolone cream (KENALOG) 0.1 %, Apply 1 Application topically 2 (two) times daily. Apply to affected areas twice daily. Avoid applying to face, groin, and axilla. Use as directed. Long-term use can cause thinning of the skin., Disp: 80 g, Rfl: 1  Observations/Objective: Patient is well-developed, well-nourished in no acute distress.  Resting comfortably  Head is normocephalic, atraumatic.  No labored breathing.  Speech is clear and coherent with logical content.  Patient is alert and oriented at baseline.  Nasal congestion  Assessment and Plan: 1. Acute bacterial sinusitis - doxycycline (VIBRA-TABS) 100 MG tablet; Take 1 tablet (100 mg total) by mouth 2 (two) times daily.  Dispense: 20 tablet; Refill: 0  - Take meds as prescribed - Use a cool mist humidifier  -Use  saline nose sprays frequently -Force fluids -For any cough or congestion  Use plain Mucinex- regular strength or max strength is fine -For fever or aces or pains- take tylenol or ibuprofen. -Throat lozenges if help -Follow up if symptoms worsen or do not improve  Follow Up Instructions: I discussed the assessment and treatment plan with the patient. The patient was provided an opportunity to ask questions and all were answered. The patient agreed with the plan and demonstrated an understanding of the instructions.  A copy of instructions were sent to the patient via MyChart unless otherwise noted below.    The patient was advised to call back or seek an in-person evaluation if the symptoms worsen or if the condition fails to improve as anticipated.  Time:  I spent 6 minutes with the patient via telehealth technology discussing the above problems/concerns.    Megan Rodney, FNP

## 2023-04-07 NOTE — Progress Notes (Unsigned)
There were no vitals taken for this visit.   Subjective:    Patient ID: Megan Bennett, female    DOB: [birth year], 37 y.o.   MRN: [id number]  HPI: Megan Bennett is a 37 y.o. female presenting on 04/08/2023 for comprehensive medical examination. Current medical complaints include:{Blank single:19197::"none","***"}  She currently lives with: Menopausal Symptoms: {Blank single:19197::"yes","no"}  ANXIETY Patient states her anxiety is improved. Patient states she feels like the Cymbalta is helping her mood.  Doesn't feel like it helps with her pain.  Doing well.  Denies SI.     09/03/2022    4:17 PM 07/22/2022    2:46 PM 02/25/2022    2:04 PM 01/14/2022    3:12 PM  GAD 7 : Generalized Anxiety Score  Nervous, Anxious, on Edge 0 1 1 1   Control/stop worrying 0 0 0 0  Worry too much - different things 0 0 0 0  Trouble relaxing 0 0 0 0  Restless 0 0 0 0  Easily annoyed or irritable 0 0 0 0  Afraid - awful might happen 0 0 0 0  Total GAD 7 Score 0 1 1 1   Anxiety Difficulty Not difficult at all Somewhat difficult  Not difficult at all   [facility] Office Visit from 09/03/2022 in [facility]  PHQ-9 Total Score 0       HYPOTHYROIDISM Thyroid control status:controlled Satisfied with current treatment? yes Medication side effects: no Medication compliance: excellent compliance Etiology of hypothyroidism:  Recent dose adjustment:no Fatigue: yes Cold intolerance: no Heat intolerance: no Weight gain: no Weight loss: no Constipation: no Diarrhea/loose stools: no Palpitations: no Lower extremity edema: no Anxiety/depressed mood: yes  Patient states she is doing accutane with dermatology.  Doing well with it.  Denies changes in her mood.   Depression Screen done today and results listed below:     09/03/2022    4:16 PM 07/22/2022    2:45 PM 02/25/2022    2:04 PM 01/14/2022    3:11 PM 10/15/2021    2:42 PM  Depression screen PHQ 2/9   Decreased Interest 0 0 0 1 0  Down, Depressed, Hopeless 0 0 0 0 0  PHQ - 2 Score 0 0 0 1 0  Altered sleeping 0 1 2 2 1   Tired, decreased energy 0 1 1 0 1  Change in appetite 0 0 0 1 0  Feeling bad or failure about yourself  0 0 0 0 0  Trouble concentrating 0 0 0 1 0  Moving slowly or fidgety/restless 0 0 0 0 0  Suicidal thoughts 0 0 0 0 0  PHQ-9 Score 0 2 3 5 2   Difficult doing work/chores Not difficult at all Not difficult at all  Not difficult at all Somewhat difficult    The patient {has/does not have:19849} a history of falls. I {did/did not:19850} complete a risk assessment for falls. A plan of care for falls {was/was not:19852} documented.   Past Medical History:  Past Medical History:  Diagnosis Date   ADD (attention deficit disorder)    Arthritis    Fibromyalgia    GERD (gastroesophageal reflux disease)    Headache    MIGRAINES    Surgical History:  Past Surgical History:  Procedure Laterality Date   BREAST BIOPSY Left 07/03/2020   [place] bx, q marker, path pending   ESOPHAGOGASTRODUODENOSCOPY     LAPAROSCOPIC TUBAL LIGATION Bilateral 08/03/2018   Procedure: LAPAROSCOPIC TUBAL LIGATION;  Surgeon: Ranae Plumber  C, MD;  Location: [facility] ORS;  Service: Gynecology;  Laterality: Bilateral;   LAPAROSCOPY N/A 08/04/2018   Procedure: LAPAROSCOPY DIAGNOSTIC;  Surgeon: Ward, Elenora Fender, MD;  Location: [facility] ORS;  Service: Gynecology;  Laterality: N/A;   SMALL BOWEL REPAIR N/A 08/04/2018   Procedure: SMALL BOWEL REPAIR;  Surgeon: Ward, Elenora Fender, MD;  Location: [facility] OR[facility];  Service: Gynecology;  Laterality: N/A;   WISDOM TOOTH EXTRACTION Bilateral     Medications:  Current Outpatient Medications on File Prior to Visit  Medication Sig   budesonide-formoterol (SYMBICORT) 160-4.5 MCG/ACT inhaler Inhale 2 puffs into the lungs 2 (two) times daily. Take 2 puffs bid for 2 weeks and then can take 2 puff prn SOB in the same way you took Albuterol   diphenhydrAMINE (BENADRYL) 25 mg capsule  Take 25 mg by mouth as needed for allergies.   doxycycline (VIBRA-TABS) 100 MG tablet Take 1 tablet (100 mg total) by mouth 2 (two) times daily.   DULoxetine (CYMBALTA) 60 MG capsule TAKE 1 CAPSULE(60 MG) BY MOUTH DAILY   fluticasone (FLONASE) 50 MCG/ACT nasal spray Place 2 sprays into both nostrils daily.   gabapentin (NEURONTIN) 300 MG capsule TAKE 1 CAPSULE(300 MG) BY MOUTH AT BEDTIME FOR 14 DAYS   ibuprofen (ADVIL) 800 MG tablet Take 1 tablet (800 mg total) by mouth every 8 (eight) hours as needed.   levothyroxine (SYNTHROID) 25 MCG tablet TAKE 1 TABLET(25 MCG) BY MOUTH DAILY   polyethylene glycol (MIRALAX) 17 g packet Take 17 g by mouth daily.   SUMAtriptan (IMITREX) 100 MG tablet Take 1 tablet (100 mg total) by mouth daily as needed.   triamcinolone cream (KENALOG) 0.1 % Apply 1 Application topically 2 (two) times daily. Apply to affected areas twice daily. Avoid applying to face, groin, and axilla. Use as directed. Long-term use can cause thinning of the skin.   No current facility-administered medications on file prior to visit.    Allergies:  Allergies  Allergen Reactions   Amoxicillin Rash    Did it involve swelling of the face/tongue/throat, SOB, or low BP? No Did it involve sudden or severe rash/hives, skin peeling, or any reaction on the inside of your mouth or nose? No Did you need to seek medical attention at a hospital or doctor's office? No When did it last happen?      14 + years If all above answers are "NO", may proceed with cephalosporin use.,    Hydrocodone Nausea And Vomiting   Wellbutrin [Bupropion] Other (See Comments)    shakey    Social History:  Social History   Socioeconomic History   Marital status: Significant Other    Spouse name: Carollee Herter   Number of children: Not on file   Years of education: Not on file   Highest education level: 12th grade  Occupational History   Not on file  Tobacco Use   Smoking status: Every Day    Current packs/day:  0.50    Average packs/day: 0.5 packs/day for 20.0 years (10.0 ttl pk-yrs)    Types: Cigarettes   Smokeless tobacco: Never  Vaping Use   Vaping status: Never Used  Substance and Sexual Activity   Alcohol use: Yes    Alcohol/week: 1.0 standard drink of alcohol    Types: 1 Glasses of wine per week    Comment: 3-4 times a week   Drug use: Never   Sexual activity: Yes    Birth control/protection: Surgical  Other Topics Concern   Not on file  Social History Narrative   Not on file   Social Determinants of Health   Financial Resource Strain: Low Risk  (09/03/2022)   Overall Financial Resource Strain (CARDIA)    Difficulty of Paying Living Expenses: Not very hard  Food Insecurity: No Food Insecurity (09/03/2022)   Hunger Vital Sign    Worried About Running Out of Food in the Last Year: Never true    Ran Out of Food in the Last Year: Never true  Transportation Needs: No Transportation Needs (09/03/2022)   PRAPARE - Administrator, Civil Service (Medical): No    Lack of Transportation (Non-Medical): No  Physical Activity: Insufficiently Active (09/03/2022)   Exercise Vital Sign    Days of Exercise per Week: 2 days    Minutes of Exercise per Session: 20 min  Stress: No Stress Concern Present (09/03/2022)   [employer] of Occupational Health - Occupational Stress Questionnaire    Feeling of Stress : Not at all  Social Connections: Moderately Integrated (09/03/2022)   Social Connection and Isolation Panel [NHANES]    Frequency of Communication with Friends and Family: More than three times a week    Frequency of Social Gatherings with Friends and Family: Patient declined    Attends Religious Services: 1 to 4 times per year    Active Member of [employer] or Organizations: No    Attends Engineer, structural: Not on file    Marital Status: Living with partner  Intimate Partner Violence: Unknown (06/05/2018)   Humiliation, Afraid, Rape, and Kick questionnaire    Fear of  Current or Ex-Partner: Not asked    Emotionally Abused: Not on file    Physically Abused: Not on file    Sexually Abused: Not on file   Social History   Tobacco Use  Smoking Status Every Day   Current packs/day: 0.50   Average packs/day: 0.5 packs/day for 20.0 years (10.0 ttl pk-yrs)   Types: Cigarettes  Smokeless Tobacco Never   Social History   Substance and Sexual Activity  Alcohol Use Yes   Alcohol/week: 1.0 standard drink of alcohol   Types: 1 Glasses of wine per week   Comment: 3-4 times a week    Family History:  Family History  Problem Relation Age of Onset   ADD / ADHD Mother    Arthritis Mother    Arthritis Father    ADD / ADHD Sister    Allergies Daughter    Breast cancer Maternal Aunt    Breast cancer Paternal Aunt    Arthritis Maternal Grandmother    Arthritis Maternal Grandfather    Cancer Maternal Grandfather     Past medical history, surgical history, medications, allergies, family history and social history reviewed with patient today and changes made to appropriate areas of the chart.   ROS All other ROS negative except what is listed above and in the HPI.      Objective:    There were no vitals taken for this visit.  Wt Readings from Last 3 Encounters:  09/25/22 266 lb (120.7 kg)  09/03/22 266 lb 4.8 oz (120.8 kg)  08/22/22 275 lb (124.7 kg)    Physical Exam  Results for orders placed or performed in visit on 09/03/22  Novel Coronavirus, NAA ([facility])   Specimen: Nasopharyngeal(NP) swabs in vial transport medium  Result Value Ref Range   SARS-CoV-2, NAA Not Detected Not Detected      Assessment & Plan:   Problem List Items Addressed This Visit  Respiratory   Asthma     Endocrine   Acquired hypothyroidism     Other   Fibromyalgia   Anxiety - Primary   Other Visit Diagnoses     Annual physical exam       Screening for ischemic heart disease            Follow up plan: No follow-ups on file.   LABORATORY  TESTING:  - Pap smear: {Blank single:19197::"pap done","not applicable","up to date","done elsewhere"}  IMMUNIZATIONS:   - Tdap: Tetanus vaccination status reviewed: {tetanus status:315746}. - Influenza: {Blank single:19197::"Up to date","Administered today","Postponed to flu season","Refused","Given elsewhere"} - Pneumovax: {Blank single:19197::"Up to date","Administered today","Not applicable","Refused","Given elsewhere"} - Prevnar: {Blank single:19197::"Up to date","Administered today","Not applicable","Refused","Given elsewhere"} - COVID: {Blank single:19197::"Up to date","Administered today","Not applicable","Refused","Given elsewhere"} - HPV: {Blank single:19197::"Up to date","Administered today","Not applicable","Refused","Given elsewhere"} - Shingrix vaccine: {Blank single:19197::"Up to date","Administered today","Not applicable","Refused","Given elsewhere"}  SCREENING: -Mammogram: {Blank single:19197::"Up to date","Ordered today","Not applicable","Refused","Done elsewhere"}  - Colonoscopy: {Blank single:19197::"Up to date","Ordered today","Not applicable","Refused","Done elsewhere"}  - Bone Density: {Blank single:19197::"Up to date","Ordered today","Not applicable","Refused","Done elsewhere"}  -Hearing Test: {Blank single:19197::"Up to date","Ordered today","Not applicable","Refused","Done elsewhere"}  -Spirometry: {Blank single:19197::"Up to date","Ordered today","Not applicable","Refused","Done elsewhere"}   PATIENT COUNSELING:   Advised to take 1 mg of folate supplement per day if capable of pregnancy.   Sexuality: Discussed sexually transmitted diseases, partner selection, use of condoms, avoidance of unintended pregnancy  and contraceptive alternatives.   Advised to avoid cigarette smoking.  I discussed with the patient that most people either abstain from alcohol or drink within safe limits (<=14/week and <=4 drinks/occasion for males, <=7/weeks and <= 3 drinks/occasion for  females) and that the risk for alcohol disorders and other health effects rises proportionally with the number of drinks per week and how often a drinker exceeds daily limits.  Discussed cessation/primary prevention of drug use and availability of treatment for abuse.   Diet: Encouraged to adjust caloric intake to maintain  or achieve ideal body weight, to reduce intake of dietary saturated fat and total fat, to limit sodium intake by avoiding high sodium foods and not adding table salt, and to maintain adequate dietary potassium and calcium preferably from fresh fruits, vegetables, and low-fat dairy products.    stressed the importance of regular exercise  Injury prevention: Discussed safety belts, safety helmets, smoke detector, smoking near bedding or upholstery.   Dental health: Discussed importance of regular tooth brushing, flossing, and dental visits.    NEXT PREVENTATIVE PHYSICAL DUE IN 1 YEAR. No follow-ups on file.

## 2023-04-08 ENCOUNTER — Other Ambulatory Visit (HOSPITAL_COMMUNITY)
Admission: RE | Admit: 2023-04-08 | Discharge: 2023-04-08 | Disposition: A | Discharge status: Home / Self Care | Payer: Medicaid Other | Source: Ambulatory Visit | Attending: Nurse Practitioner | Admitting: Nurse Practitioner

## 2023-04-08 ENCOUNTER — Encounter: Payer: Self-pay | Admitting: Nurse Practitioner

## 2023-04-08 ENCOUNTER — Ambulatory Visit (INDEPENDENT_AMBULATORY_CARE_PROVIDER_SITE_OTHER): Payer: Medicaid Other | Admitting: Nurse Practitioner

## 2023-04-08 VITALS — BP 117/81 | HR 121 | Temp 98.2°F | Ht 67.0 in | Wt 273.0 lb

## 2023-04-08 DIAGNOSIS — E039 Hypothyroidism, unspecified: Secondary | ICD-10-CM

## 2023-04-08 DIAGNOSIS — Z0000 Encounter for general adult medical examination without abnormal findings: Secondary | ICD-10-CM | POA: Insufficient documentation

## 2023-04-08 DIAGNOSIS — M797 Fibromyalgia: Secondary | ICD-10-CM | POA: Diagnosis not present

## 2023-04-08 DIAGNOSIS — Z136 Encounter for screening for cardiovascular disorders: Secondary | ICD-10-CM | POA: Diagnosis not present

## 2023-04-08 DIAGNOSIS — Z23 Encounter for immunization: Secondary | ICD-10-CM | POA: Diagnosis not present

## 2023-04-08 DIAGNOSIS — F419 Anxiety disorder, unspecified: Secondary | ICD-10-CM | POA: Diagnosis not present

## 2023-04-08 DIAGNOSIS — J4530 Mild persistent asthma, uncomplicated: Secondary | ICD-10-CM

## 2023-04-08 LAB — URINALYSIS, ROUTINE W REFLEX MICROSCOPIC
Bilirubin, UA: NEGATIVE
Glucose, UA: NEGATIVE
Leukocytes,UA: NEGATIVE
Nitrite, UA: NEGATIVE
RBC, UA: NEGATIVE
Specific Gravity, UA: >1.03 — ABNORMAL HIGH (ref 1.005–1.030)
Urobilinogen, Ur: 0.2 mg/dL (ref 0.2–1.0)
pH, UA: 6 (ref 5.0–7.5)

## 2023-04-08 MED ORDER — LEVOTHYROXINE SODIUM 25 MCG PO TABS: ORAL_TABLET | ORAL | 3 refills | Status: DC

## 2023-04-08 MED ORDER — BUDESONIDE-FORMOTEROL FUMARATE 160-4.5 MCG/ACT IN AERO: 2.0000 | INHALATION_SPRAY | Freq: Two times a day (BID) | RESPIRATORY_TRACT | 3 refills | Status: DC

## 2023-04-08 MED ORDER — SUMATRIPTAN SUCCINATE 100 MG PO TABS: 100.0000 mg | ORAL_TABLET | Freq: Every day | ORAL | 1 refills | Status: DC | PRN

## 2023-04-08 MED ORDER — DULOXETINE HCL 60 MG PO CPEP: 60.0000 mg | ORAL_CAPSULE | Freq: Two times a day (BID) | ORAL | 1 refills | Status: DC

## 2023-04-08 NOTE — Assessment & Plan Note (Signed)
Chronic.  Controlled.  Continue with current medication regimen.  However, having a lot of pain from Fibromyalgia.  Will increase to Duloxetine 120mg .  Labs ordered today.  Return to clinic in 6 months for reevaluation.  Call sooner if concerns arise.

## 2023-04-08 NOTE — Assessment & Plan Note (Signed)
Chronic.  Ongoing concern.  Worse during certain times of the year.  Will increase Cymbalta to 60mg  BID.  Follow up in 6 months. Call sooner if concerns arise.

## 2023-04-08 NOTE — Assessment & Plan Note (Signed)
Chronic.  Controlled.  Continue with current medication regimen of Levothyroxine 3mg daily.  Labs ordered today.  Return to clinic in 6 months for reevaluation.  Call sooner if concerns arise.

## 2023-04-09 ENCOUNTER — Other Ambulatory Visit: Payer: Self-pay | Admitting: Nurse Practitioner

## 2023-04-09 ENCOUNTER — Telehealth: Payer: Self-pay

## 2023-04-09 LAB — CBC WITH DIFFERENTIAL/PLATELET
Basophils Absolute: 0.1 10*3/uL (ref 0.0–0.2)
Basos: 1 %
EOS (ABSOLUTE): 0.3 10*3/uL (ref 0.0–0.4)
Eos: 3 %
Hematocrit: 43.4 % (ref 34.0–46.6)
Hemoglobin: 14.8 g/dL (ref 11.1–15.9)
Immature Grans (Abs): 0 10*3/uL (ref 0.0–0.1)
Immature Granulocytes: 0 %
Lymphocytes Absolute: 3.4 10*3/uL — ABNORMAL HIGH (ref 0.7–3.1)
Lymphs: 34 %
MCH: 33.3 pg — ABNORMAL HIGH (ref 26.6–33.0)
MCHC: 34.1 g/dL (ref 31.5–35.7)
MCV: 98 fL — ABNORMAL HIGH (ref 79–97)
Monocytes Absolute: 0.7 10*3/uL (ref 0.1–0.9)
Monocytes: 7 %
Neutrophils Absolute: 5.5 10*3/uL (ref 1.4–7.0)
Neutrophils: 55 %
Platelets: 355 10*3/uL (ref 150–450)
RBC: 4.45 x10E6/uL (ref 3.77–5.28)
RDW: 11.9 % (ref 11.7–15.4)
WBC: 10 10*3/uL (ref 3.4–10.8)

## 2023-04-09 LAB — LIPID PANEL
Chol/HDL Ratio: 3.1 ratio (ref 0.0–4.4)
Cholesterol, Total: 178 mg/dL (ref 100–199)
HDL: 58 mg/dL (ref >39)
LDL Chol Calc (NIH): 98 mg/dL (ref 0–99)
Triglycerides: 124 mg/dL (ref 0–149)
VLDL Cholesterol Cal: 22 mg/dL (ref 5–40)

## 2023-04-09 LAB — COMPREHENSIVE METABOLIC PANEL
ALT: 15 [IU]/L (ref 0–32)
AST: 14 [IU]/L (ref 0–40)
Albumin: 4.1 g/dL (ref 3.9–4.9)
Alkaline Phosphatase: 100 [IU]/L (ref 44–121)
BUN/Creatinine Ratio: 21 (ref 9–23)
BUN: 15 mg/dL (ref 6–20)
Bilirubin Total: 0.4 mg/dL (ref 0.0–1.2)
CO2: 18 mmol/L — ABNORMAL LOW (ref 20–29)
Calcium: 9.4 mg/dL (ref 8.7–10.2)
Chloride: 107 mmol/L — ABNORMAL HIGH (ref 96–106)
Creatinine, Ser: 0.72 mg/dL (ref 0.57–1.00)
Globulin, Total: 2.5 g/dL (ref 1.5–4.5)
Glucose: 112 mg/dL — ABNORMAL HIGH (ref 70–99)
Potassium: 4.4 mmol/L (ref 3.5–5.2)
Sodium: 142 mmol/L (ref 134–144)
Total Protein: 6.6 g/dL (ref 6.0–8.5)
eGFR: 109 mL/min/{1.73_m2} (ref >59)

## 2023-04-09 LAB — TSH: TSH: 10.7 u[IU]/mL — ABNORMAL HIGH (ref 0.450–4.500)

## 2023-04-09 LAB — T4, FREE: Free T4: 0.93 ng/dL (ref 0.82–1.77)

## 2023-04-09 MED ORDER — BUDESONIDE-FORMOTEROL FUMARATE 160-4.5 MCG/ACT IN AERO: 2.0000 | INHALATION_SPRAY | Freq: Two times a day (BID) | RESPIRATORY_TRACT | 3 refills | Status: DC

## 2023-04-09 NOTE — Telephone Encounter (Signed)
Symbicort sent to the pharmacy.

## 2023-04-09 NOTE — Telephone Encounter (Signed)
Medication Refill - Medication: ibuprofen (ADVIL) 800 MG tablet   Has the patient contacted their pharmacy? no Pt called directly back to [place] because she says Dr Caren Griffins was supposed to request this but it wasn't on order  Preferred Pharmacy (with phone number or street name): [id number] [place], [place] - [address] AT [facility] OF [address] [facility] Phone: [phone]  Fax: [phone]   Has the patient been seen for an appointment in the last year OR does the patient have an upcoming appointment? yes  Agent: Please be advised that RX refills may take up to 3 business days. We ask that you follow-up with your pharmacy.

## 2023-04-09 NOTE — Telephone Encounter (Signed)
Pharmacy is requesting a drug change for Breyna 160/4.5 because drug is not covered by platient plan. Preferred alternative is Symbicort, Advairhfa or advairdiskus. Please advise

## 2023-04-10 LAB — CYTOLOGY - PAP: Diagnosis: NEGATIVE

## 2023-04-10 MED ORDER — IBUPROFEN 800 MG PO TABS: 800.0000 mg | ORAL_TABLET | Freq: Three times a day (TID) | ORAL | 1 refills | Status: DC | PRN

## 2023-04-10 NOTE — Telephone Encounter (Signed)
Requested medications are due for refill today.  yes  Requested medications are on the active medications list.  yes  Last refill. 10/18/2022 #90 1 rf  Future visit scheduled.   yes  Notes to clinic.  Rx signed by Sharl Ma.    Requested Prescriptions  Pending Prescriptions Disp Refills   ibuprofen (ADVIL) 800 MG tablet 90 tablet 1    Sig: Take 1 tablet (800 mg total) by mouth every 8 (eight) hours as needed.     Analgesics:  NSAIDS Failed - 04/09/2023  2:39 PM      Failed - Manual Review: Labs are only required if the patient has taken medication for more than 8 weeks.      Passed - Cr in normal range and within 360 days    Creatinine, Ser  Date Value Ref Range Status  04/08/2023 0.72 0.57 - 1.00 mg/dL Final   Creatinine, Urine  Date Value Ref Range Status  06/07/2018 16 mg/dL Final         Passed - HGB in normal range and within 360 days    Hemoglobin  Date Value Ref Range Status  04/08/2023 14.8 11.1 - 15.9 g/dL Final         Passed - PLT in normal range and within 360 days    Platelets  Date Value Ref Range Status  04/08/2023 355 150 - 450 x10E3/uL Final         Passed - HCT in normal range and within 360 days    Hematocrit  Date Value Ref Range Status  04/08/2023 43.4 34.0 - 46.6 % Final         Passed - eGFR is 30 or above and within 360 days    GFR calc Af Amer  Date Value Ref Range Status  08/10/2018 >60 >60 mL/min Final   GFR calc non Af Amer  Date Value Ref Range Status  08/10/2018 >60 >60 mL/min Final   eGFR  Date Value Ref Range Status  04/08/2023 109 >59 mL/min/1.73 Final         Passed - Patient is not pregnant      Passed - Valid encounter within last 12 months    Recent Outpatient Visits           2 days ago Annual physical exam   [place] [facility] Larae Grooms, NP   7 months ago Cough, unspecified type   [facility] Gabriel Cirri, NP   8 months ago Acquired hypothyroidism    [place] [facility] Larae Grooms, NP   1 year ago Bilateral carpal tunnel syndrome   [facility] Marjie Skiff, NP   1 year ago Anxiety   [place] Larae Grooms, NP       Future Appointments             In 6 months Larae Grooms, NP [place] [facility]

## 2023-04-11 DIAGNOSIS — Z419 Encounter for procedure for purposes other than remedying health state, unspecified: Secondary | ICD-10-CM | POA: Diagnosis not present

## 2023-04-22 ENCOUNTER — Telehealth: Payer: Self-pay | Admitting: Nurse Practitioner

## 2023-04-22 DIAGNOSIS — G5603 Carpal tunnel syndrome, bilateral upper limbs: Secondary | ICD-10-CM

## 2023-04-22 NOTE — Telephone Encounter (Unsigned)
Copied from CRM [id number]. Topic: Referral - Status >> Apr 22, 2023  1:19 PM Macon Large wrote: Reason for CRM: Pt stated that she still needs the referral to be sent to [facility] for a specialist for carpel tunnel.

## 2023-04-23 NOTE — Telephone Encounter (Signed)
Called and LVM notifying patient that referral has been placed as requested.

## 2023-04-23 NOTE — Telephone Encounter (Signed)
Referral placed.

## 2023-04-30 ENCOUNTER — Encounter: Payer: Medicaid Other | Admitting: Podiatry

## 2023-05-11 DIAGNOSIS — Z419 Encounter for procedure for purposes other than remedying health state, unspecified: Secondary | ICD-10-CM | POA: Diagnosis not present

## 2023-05-14 ENCOUNTER — Ambulatory Visit (INDEPENDENT_AMBULATORY_CARE_PROVIDER_SITE_OTHER): Payer: Medicaid Other | Admitting: Podiatry

## 2023-05-14 DIAGNOSIS — Z91199 Patient's noncompliance with other medical treatment and regimen due to unspecified reason: Secondary | ICD-10-CM

## 2023-05-14 NOTE — Progress Notes (Signed)
Patient was no-show for appointment today 

## 2023-05-22 ENCOUNTER — Ambulatory Visit: Payer: Medicaid Other

## 2023-05-22 ENCOUNTER — Ambulatory Visit: Payer: Medicaid Other | Admitting: Podiatry

## 2023-05-22 ENCOUNTER — Encounter: Payer: Self-pay | Admitting: Podiatry

## 2023-05-22 DIAGNOSIS — M216X1 Other acquired deformities of right foot: Secondary | ICD-10-CM

## 2023-05-22 DIAGNOSIS — M216X2 Other acquired deformities of left foot: Secondary | ICD-10-CM

## 2023-05-22 DIAGNOSIS — M79671 Pain in right foot: Secondary | ICD-10-CM

## 2023-05-22 DIAGNOSIS — T148XXA Other injury of unspecified body region, initial encounter: Secondary | ICD-10-CM

## 2023-05-22 MED ORDER — OXYCODONE-ACETAMINOPHEN 5-325 MG PO TABS
1.0000 | ORAL_TABLET | ORAL | 0 refills | Status: DC | PRN
Start: 2023-05-22 — End: 2023-11-18

## 2023-05-22 MED ORDER — AMMONIUM LACTATE 12 % EX LOTN: 1.0000 | TOPICAL_LOTION | CUTANEOUS | 0 refills | Status: AC | PRN

## 2023-05-22 NOTE — Progress Notes (Signed)
Subjective:  Patient ID: Megan Bennett, female    DOB: Apr 01, 1986,  MRN: [id number]  No chief complaint on file.   37 y.o. female presents with the above complaint.  Patient presents with complaint of right first metatarsophalangeal joint pain.  Patient states painful to touch.  She states she had a recent fall that caused her some issues.  Is most of the right side.  The left side is doing good denies any other acute complaints it appears she may have had some soft tissue contusion to the right side when she fell.  Pain scale 7 out of 10 dull aching nature   Review of Systems: Negative except as noted in the HPI. Denies N/V/F/Ch.  Past Medical History:  Diagnosis Date   ADD (attention deficit disorder)    Arthritis    Fibromyalgia    GERD (gastroesophageal reflux disease)    Headache    MIGRAINES    Current Outpatient Medications:    ammonium lactate (AMLACTIN DAILY) 12 % lotion, Apply 1 Application topically as needed., Disp: 400 g, Rfl: 0   budesonide-formoterol (SYMBICORT) 160-4.5 MCG/ACT inhaler, Inhale 2 puffs into the lungs 2 (two) times daily. Take 2 puffs bid for 2 weeks and then can take 2 puff prn SOB in the same way you took Albuterol, Disp: 1 each, Rfl: 3   diphenhydrAMINE (BENADRYL) 25 mg capsule, Take 25 mg by mouth as needed for allergies., Disp: , Rfl:    DULoxetine (CYMBALTA) 60 MG capsule, Take 1 capsule (60 mg total) by mouth 2 (two) times daily., Disp: 180 capsule, Rfl: 1   fluticasone (FLONASE) 50 MCG/ACT nasal spray, Place 2 sprays into both nostrils daily., Disp: 16 g, Rfl: 0   ibuprofen (ADVIL) 800 MG tablet, Take 1 tablet (800 mg total) by mouth every 8 (eight) hours as needed., Disp: 90 tablet, Rfl: 1   levothyroxine (SYNTHROID) 25 MCG tablet, TAKE 1 TABLET(25 MCG) BY MOUTH DAILY, Disp: 90 tablet, Rfl: 3   oxyCODONE-acetaminophen (PERCOCET) 5-325 MG tablet, Take 1 tablet by mouth every 4 (four) hours as needed for severe pain (pain score 7-10)., Disp:  30 tablet, Rfl: 0   polyethylene glycol (MIRALAX) 17 g packet, Take 17 g by mouth daily., Disp: 14 each, Rfl: 1   SUMAtriptan (IMITREX) 100 MG tablet, Take 1 tablet (100 mg total) by mouth daily as needed., Disp: 10 tablet, Rfl: 1   triamcinolone cream (KENALOG) 0.1 %, Apply 1 Application topically 2 (two) times daily. Apply to affected areas twice daily. Avoid applying to face, groin, and axilla. Use as directed. Long-term use can cause thinning of the skin., Disp: 80 g, Rfl: 1  Social History   Tobacco Use  Smoking Status Every Day   Current packs/day: 0.50   Average packs/day: 0.5 packs/day for 20.0 years (10.0 ttl pk-yrs)   Types: Cigarettes  Smokeless Tobacco Never    Allergies  Allergen Reactions   Amoxicillin Rash    Did it involve swelling of the face/tongue/throat, SOB, or low BP? No Did it involve sudden or severe rash/hives, skin peeling, or any reaction on the inside of your mouth or nose? No Did you need to seek medical attention at a hospital or doctor's office? No When did it last happen?      14 + years If all above answers are "NO", may proceed with cephalosporin use.,    Hydrocodone Nausea And Vomiting   Wellbutrin [Bupropion] Other (See Comments)    shakey   Objective:  There were  no vitals filed for this visit. Body mass index is 42.76 kg/m. Constitutional Well developed. Well nourished.  Vascular Dorsalis pedis pulses palpable bilaterally. Posterior tibial pulses palpable bilaterally. Capillary refill normal to all digits.  No cyanosis or clubbing noted. Pedal hair growth normal.  Neurologic Normal speech. Oriented to person, place, and time. Epicritic sensation to light touch grossly present bilaterally.  Dermatologic Nails well groomed and normal in appearance. No open wounds. No skin lesions.  Orthopedic: Right pain on palpation first metatarsophalangeal joint.  Pain with range of motion of the joint no deep intra-articular pain noted no open  wounds or lesions noted.  No pain on palpation to the left side   Radiographs: Bilateral 3 views of skeletally mature foot x-rays: No breaks noted.  No fractures noted.  No other bony abnormalities identified.  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 Assessment:   1. Plantar flexed metatarsal, left   2. Contusion of soft tissue    Plan:  Patient was evaluated and treated and all questions answered.  Right foot soft tissue contusion -All question concerns were discussed with the patient given the amount of pain that she is experiencing she will benefit from cam boot immobilization her previous cam boot was lost therefore she needs a new one.  New cam boot was dispensed encouraged her to wear it for next 4 weeks we will take about 4 to 6 weeks for the pain to start getting reduced.  Pain medication was sent to the pharmacy  Left fifth floating osteotomy -Clinically healed and no further pain noted  No follow-ups on file.

## 2023-06-11 DIAGNOSIS — Z419 Encounter for procedure for purposes other than remedying health state, unspecified: Secondary | ICD-10-CM | POA: Diagnosis not present

## 2023-06-17 ENCOUNTER — Encounter: Payer: Self-pay | Admitting: Nurse Practitioner

## 2023-07-02 DIAGNOSIS — G5603 Carpal tunnel syndrome, bilateral upper limbs: Secondary | ICD-10-CM | POA: Diagnosis not present

## 2023-07-10 ENCOUNTER — Ambulatory Visit: Payer: Medicaid Other | Admitting: Podiatry

## 2023-07-23 ENCOUNTER — Ambulatory Visit: Payer: Medicaid Other | Admitting: Podiatry

## 2023-07-23 ENCOUNTER — Encounter: Payer: Self-pay | Admitting: Podiatry

## 2023-07-23 ENCOUNTER — Ambulatory Visit (INDEPENDENT_AMBULATORY_CARE_PROVIDER_SITE_OTHER): Payer: Medicaid Other

## 2023-07-23 DIAGNOSIS — M2041 Other hammer toe(s) (acquired), right foot: Secondary | ICD-10-CM

## 2023-07-23 DIAGNOSIS — M84377A Stress fracture, right toe(s), initial encounter for fracture: Secondary | ICD-10-CM

## 2023-07-23 DIAGNOSIS — M10371 Gout due to renal impairment, right ankle and foot: Secondary | ICD-10-CM | POA: Diagnosis not present

## 2023-07-23 MED ORDER — PREDNISONE 5 MG PO TABS: ORAL_TABLET | ORAL | 0 refills | Status: AC

## 2023-07-23 MED ORDER — DICLOFENAC SODIUM 75 MG PO TBEC
75.0000 mg | DELAYED_RELEASE_TABLET | Freq: Two times a day (BID) | ORAL | 0 refills | Status: DC
Start: 2023-07-30 — End: 2023-11-18

## 2023-07-23 NOTE — Patient Instructions (Signed)

## 2023-07-23 NOTE — Progress Notes (Signed)
  Subjective:  Patient ID: Megan Bennett, female    DOB: 08-28-1985,  MRN: [id number]  Chief Complaint  Patient presents with   Toe Pain    "It's still swollen and it's painful.  I ran out of my Percocet."    38 y.o. female presents with the above complaint. History confirmed with patient.  She injured her foot by missing a step and jamming it into the deck in early December.  She saw Dr. Allena Katz for this.  X-rays were taken at that time and did not show any fracture.  He recommended a cam boot and prescribed her Percocet.  Toe is still quite painful and she has not been able to fully resume regular shoe gear and activity despite using the boot for several weeks.  Objective:  Physical Exam: warm, good capillary refill, no trophic changes or ulcerative lesions, normal DP and PT pulses, normal sensory exam, and sharp acute pain to palpation of the distal and proximal phalanx of the hallux and with range of motion of the interphalangeal joint no pain on the nailbed no pain in the MTP or metatarsal.   Radiographs: Multiple views x-ray of the right foot: Radiographs taken today do not show any acute fracture or joint derangement of the interphalangeal or metatarsophalangeal joint Assessment:   1. Stress fracture of phalanx of toe of right foot, initial encounter   2. 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  We reviewed her progress.  She does not have any known history or family history of gout.  I did recommend checking her for this and lab work has been ordered.  Clinic her symptoms are consistent with a stress fracture or intra-articular cartilaginous injury of the interphalangeal joint.  I recommend an MRI to evaluate further.  I would expect simple contusion or minor injury to have healed at this point with immobilization over 2 months after her injury.  MRI has been ordered.  Rx for  methylprednisone taper followed by diclofenac twice daily sent to pharmacy.  She will follow with me after the MRI to reevaluate treatment options.  Return for after MRI to review.

## 2023-07-25 ENCOUNTER — Encounter: Payer: Self-pay | Admitting: Podiatry

## 2023-07-29 ENCOUNTER — Telehealth: Payer: Self-pay

## 2023-07-29 ENCOUNTER — Encounter: Payer: Self-pay | Admitting: Podiatry

## 2023-07-29 NOTE — Telephone Encounter (Signed)
 PA request received for diclofenac 75mg . PA submitted through covermymeds  [employer]  (Key: [id number])

## 2023-07-30 NOTE — Telephone Encounter (Signed)
 PA was approved 07/30/23-07/29/24

## 2023-08-07 ENCOUNTER — Telehealth: Payer: Medicaid Other | Admitting: Family Medicine

## 2023-08-07 DIAGNOSIS — J069 Acute upper respiratory infection, unspecified: Secondary | ICD-10-CM | POA: Diagnosis not present

## 2023-08-07 MED ORDER — AZITHROMYCIN 250 MG PO TABS: ORAL_TABLET | ORAL | 0 refills | Status: AC

## 2023-08-07 NOTE — Progress Notes (Signed)
 Virtual Visit Consent   Megan Bennett, you are scheduled for a virtual visit with a [name] provider today. Just as with appointments in the office, your consent must be obtained to participate. Your consent will be active for this visit and any virtual visit you may have with one of our providers in the next 365 days. If you have a MyChart account, a copy of this consent can be sent to you electronically.  As this is a virtual visit, video technology does not allow for your provider to perform a traditional examination. This may limit your provider's ability to fully assess your condition. If your provider identifies any concerns that need to be evaluated in person or the need to arrange testing (such as labs, EKG, etc.), we will make arrangements to do so. Although advances in technology are sophisticated, we cannot ensure that it will always work on either your end or our end. If the connection with a video visit is poor, the visit may have to be switched to a telephone visit. With either a video or telephone visit, we are not always able to ensure that we have a secure connection.  By engaging in this virtual visit, you consent to the provision of healthcare and authorize for your insurance to be billed (if applicable) for the services provided during this visit. Depending on your insurance coverage, you may receive a charge related to this service.  I need to obtain your verbal consent now. Are you willing to proceed with your visit today? Megan Bennett has provided verbal consent on 08/07/2023 for a virtual visit (video or telephone). Megan Finner, NP  Date: 08/07/2023 3:40 PM   Virtual Visit via Video Note   I, Megan Bennett, connected with  Megan Bennett  ([id number], [birth year]) on 08/07/23 at  3:45 PM EST by a video-enabled telemedicine application and verified that I am speaking with the correct person using two identifiers.  Location: Patient: Virtual  Visit Location Patient: Home Provider: Virtual Visit Location Provider: Home Office   I discussed the limitations of evaluation and management by telemedicine and the availability of in person appointments. The patient expressed understanding and agreed to proceed.    History of Present Illness: Megan Bennett is a 38 y.o. who identifies as a female who was assigned female at birth, and is being seen today for sinus infection  Onset was in last 7 days with worsening symptoms in last 24 hours  facial pressure on left side of face and sinuses Associated symptoms are congestion, mild cough, decreased appetite, chill sensation and sweating  Modifying factors are nyquil  Denies chest pain, shortness of breath  Exposure to sick contacts- unknown COVID test: no Vaccines: not update/ sure   Problems:  Patient Active Problem List   Diagnosis Date Noted   Asthma 09/03/2022   Bilateral carpal tunnel syndrome 02/25/2022   Acquired hypothyroidism 08/13/2021   Anxiety 08/13/2021   Fibromyalgia 03/19/2021   Nicotine dependence, cigarettes, uncomplicated 08/29/2020   S/P laparoscopic surgery 08/04/2018    Allergies:  Allergies  Allergen Reactions   Amoxicillin Rash    Did it involve swelling of the face/tongue/throat, SOB, or low BP? No Did it involve sudden or severe rash/hives, skin peeling, or any reaction on the inside of your mouth or nose? No Did you need to seek medical attention at a hospital or doctor's office? No When did it last happen?      14 + years If all  above answers are "NO", may proceed with cephalosporin use.,    Hydrocodone Nausea And Vomiting   Wellbutrin [Bupropion] Other (See Comments)    shakey   Medications:  Current Outpatient Medications:    ammonium lactate (AMLACTIN DAILY) 12 % lotion, Apply 1 Application topically as needed., Disp: 400 g, Rfl: 0   budesonide-formoterol (SYMBICORT) 160-4.5 MCG/ACT inhaler, Inhale 2 puffs into the lungs 2 (two) times  daily. Take 2 puffs bid for 2 weeks and then can take 2 puff prn SOB in the same way you took Albuterol, Disp: 1 each, Rfl: 3   diclofenac (VOLTAREN) 75 MG EC tablet, Take 1 tablet (75 mg total) by mouth 2 (two) times daily., Disp: 60 tablet, Rfl: 0   diphenhydrAMINE (BENADRYL) 25 mg capsule, Take 25 mg by mouth as needed for allergies., Disp: , Rfl:    DULoxetine (CYMBALTA) 60 MG capsule, Take 1 capsule (60 mg total) by mouth 2 (two) times daily., Disp: 180 capsule, Rfl: 1   fluticasone (FLONASE) 50 MCG/ACT nasal spray, Place 2 sprays into both nostrils daily., Disp: 16 g, Rfl: 0   ibuprofen (ADVIL) 800 MG tablet, Take 1 tablet (800 mg total) by mouth every 8 (eight) hours as needed., Disp: 90 tablet, Rfl: 1   levothyroxine (SYNTHROID) 25 MCG tablet, TAKE 1 TABLET(25 MCG) BY MOUTH DAILY, Disp: 90 tablet, Rfl: 3   oxyCODONE-acetaminophen (PERCOCET) 5-325 MG tablet, Take 1 tablet by mouth every 4 (four) hours as needed for severe pain (pain score 7-10). (Patient not taking: Reported on 07/23/2023), Disp: 30 tablet, Rfl: 0   polyethylene glycol (MIRALAX) 17 g packet, Take 17 g by mouth daily., Disp: 14 each, Rfl: 1   SUMAtriptan (IMITREX) 100 MG tablet, Take 1 tablet (100 mg total) by mouth daily as needed., Disp: 10 tablet, Rfl: 1   triamcinolone cream (KENALOG) 0.1 %, Apply 1 Application topically 2 (two) times daily. Apply to affected areas twice daily. Avoid applying to face, groin, and axilla. Use as directed. Long-term use can cause thinning of the skin., Disp: 80 g, Rfl: 1  Observations/Objective: Patient is well-developed, well-nourished in no acute distress.  Resting comfortably  at home.  Head is normocephalic, atraumatic.  No labored breathing.  Speech is clear and coherent with logical content.  Patient is alert and oriented at baseline.    Assessment and Plan:  1. Upper respiratory tract infection, unspecified type (Primary)  - azithromycin (ZITHROMAX) 250 MG tablet; Take 2 tablets  on day 1, then 1 tablet daily on days 2 through 5  Dispense: 6 tablet; Refill: 0  - Increased rest - Increasing Fluids - Acetaminophen / ibuprofen as needed for fever/pain.  - Salt water gargling, chloraseptic spray and throat lozenges - Mucinex if mucus is present and increasing.  - Saline nasal spray if congestion or if nasal passages feel dry. - Humidifying the air.   Reviewed side effects, risks and benefits of medication.    Patient acknowledged agreement and understanding of the plan.   Past Medical, Surgical, Social History, Allergies, and Medications have been Reviewed.      Follow Up Instructions: I discussed the assessment and treatment plan with the patient. The patient was provided an opportunity to ask questions and all were answered. The patient agreed with the plan and demonstrated an understanding of the instructions.  A copy of instructions were sent to the patient via MyChart unless otherwise noted below.     The patient was advised to call back or seek an in-person  evaluation if the symptoms worsen or if the condition fails to improve as anticipated.    Megan Finner, NP

## 2023-08-07 NOTE — Patient Instructions (Signed)
 Megan Bennett, thank you for joining Freddy Finner, NP for today's virtual visit.  While this provider is not your primary care provider (PCP), if your PCP is located in our provider database this encounter information will be shared with them immediately following your visit.   A [facility] MyChart account gives you access to today's visit and all your visits, tests, and labs performed at [facility] " click here if you don't have a [facility] MyChart account or go to [email or web address]  Consent: (Patient) Megan Bennett provided verbal consent for this virtual visit at the beginning of the encounter.  Current Medications:  Current Outpatient Medications:    azithromycin (ZITHROMAX) 250 MG tablet, Take 2 tablets on day 1, then 1 tablet daily on days 2 through 5, Disp: 6 tablet, Rfl: 0   ammonium lactate (AMLACTIN DAILY) 12 % lotion, Apply 1 Application topically as needed., Disp: 400 g, Rfl: 0   budesonide-formoterol (SYMBICORT) 160-4.5 MCG/ACT inhaler, Inhale 2 puffs into the lungs 2 (two) times daily. Take 2 puffs bid for 2 weeks and then can take 2 puff prn SOB in the same way you took Albuterol, Disp: 1 each, Rfl: 3   diclofenac (VOLTAREN) 75 MG EC tablet, Take 1 tablet (75 mg total) by mouth 2 (two) times daily., Disp: 60 tablet, Rfl: 0   diphenhydrAMINE (BENADRYL) 25 mg capsule, Take 25 mg by mouth as needed for allergies., Disp: , Rfl:    DULoxetine (CYMBALTA) 60 MG capsule, Take 1 capsule (60 mg total) by mouth 2 (two) times daily., Disp: 180 capsule, Rfl: 1   fluticasone (FLONASE) 50 MCG/ACT nasal spray, Place 2 sprays into both nostrils daily., Disp: 16 g, Rfl: 0   ibuprofen (ADVIL) 800 MG tablet, Take 1 tablet (800 mg total) by mouth every 8 (eight) hours as needed., Disp: 90 tablet, Rfl: 1   levothyroxine (SYNTHROID) 25 MCG tablet, TAKE 1 TABLET(25 MCG) BY MOUTH DAILY, Disp: 90 tablet, Rfl: 3   oxyCODONE-acetaminophen (PERCOCET) 5-325 MG  tablet, Take 1 tablet by mouth every 4 (four) hours as needed for severe pain (pain score 7-10). (Patient not taking: Reported on 07/23/2023), Disp: 30 tablet, Rfl: 0   polyethylene glycol (MIRALAX) 17 g packet, Take 17 g by mouth daily., Disp: 14 each, Rfl: 1   SUMAtriptan (IMITREX) 100 MG tablet, Take 1 tablet (100 mg total) by mouth daily as needed., Disp: 10 tablet, Rfl: 1   triamcinolone cream (KENALOG) 0.1 %, Apply 1 Application topically 2 (two) times daily. Apply to affected areas twice daily. Avoid applying to face, groin, and axilla. Use as directed. Long-term use can cause thinning of the skin., Disp: 80 g, Rfl: 1   Medications ordered in this encounter:  Meds ordered this encounter  Medications   azithromycin (ZITHROMAX) 250 MG tablet    Sig: Take 2 tablets on day 1, then 1 tablet daily on days 2 through 5    Dispense:  6 tablet    Refill:  0    Supervising Provider:   Merrilee Jansky [phone]     *If you need refills on other medications prior to your next appointment, please contact your pharmacy*  Follow-Up: Call back or seek an in-person evaluation if the symptoms worsen or if the condition fails to improve as anticipated.  [facility] [phone]  Other Instructions  URI recommendations: - Increased rest - Increasing Fluids - Acetaminophen / ibuprofen as needed for fever/pain.  - Salt water gargling, chloraseptic  spray and throat lozenges - Mucinex if mucus is present and increasing.  - Saline nasal spray if congestion or if nasal passages feel dry. - Humidifying the air.     If you have been instructed to have an in-person evaluation today at a local Urgent Care facility, please use the link below. It will take you to a list of all of our available [facility], including address, phone number and hours of operation. Please do not delay care.  [facility]  If you or a family member do not have a primary care provider,  use the link below to schedule a visit and establish care. When you choose a [facility] primary care physician or advanced practice provider, you gain a long-term partner in health. Find a Primary Care Provider  Learn more about [facility]'s in-office and virtual care options: [facility] - Get Care Now

## 2023-08-11 ENCOUNTER — Ambulatory Visit
Admission: RE | Admit: 2023-08-11 | Discharge: 2023-08-11 | Discharge status: Home / Self Care | Payer: Medicaid Other | Source: Ambulatory Visit | Attending: Podiatry

## 2023-08-11 ENCOUNTER — Encounter: Payer: Self-pay | Admitting: Podiatry

## 2023-08-11 DIAGNOSIS — S92424A Nondisplaced fracture of distal phalanx of right great toe, initial encounter for closed fracture: Secondary | ICD-10-CM | POA: Diagnosis not present

## 2023-08-11 DIAGNOSIS — M79674 Pain in right toe(s): Secondary | ICD-10-CM | POA: Diagnosis not present

## 2023-08-11 DIAGNOSIS — M84377A Stress fracture, right toe(s), initial encounter for fracture: Secondary | ICD-10-CM

## 2023-09-08 ENCOUNTER — Ambulatory Visit (INDEPENDENT_AMBULATORY_CARE_PROVIDER_SITE_OTHER): Admitting: Podiatry

## 2023-09-08 DIAGNOSIS — Z91199 Patient's noncompliance with other medical treatment and regimen due to unspecified reason: Secondary | ICD-10-CM

## 2023-09-10 NOTE — Progress Notes (Signed)
 Patient was no-show for appointment today

## 2023-10-01 ENCOUNTER — Ambulatory Visit: Admitting: Podiatry

## 2023-10-06 ENCOUNTER — Ambulatory Visit: Admitting: Podiatry

## 2023-10-06 ENCOUNTER — Ambulatory Visit (INDEPENDENT_AMBULATORY_CARE_PROVIDER_SITE_OTHER)

## 2023-10-06 ENCOUNTER — Encounter: Payer: Self-pay | Admitting: Podiatry

## 2023-10-06 VITALS — Ht 67.0 in | Wt 273.0 lb

## 2023-10-06 DIAGNOSIS — M84372A Stress fracture, left ankle, initial encounter for fracture: Secondary | ICD-10-CM | POA: Diagnosis not present

## 2023-10-06 DIAGNOSIS — S92902S Unspecified fracture of left foot, sequela: Secondary | ICD-10-CM

## 2023-10-06 DIAGNOSIS — S92424A Nondisplaced fracture of distal phalanx of right great toe, initial encounter for closed fracture: Secondary | ICD-10-CM | POA: Diagnosis not present

## 2023-10-06 DIAGNOSIS — M84377D Stress fracture, right toe(s), subsequent encounter for fracture with routine healing: Secondary | ICD-10-CM

## 2023-10-06 DIAGNOSIS — S92901S Unspecified fracture of right foot, sequela: Secondary | ICD-10-CM

## 2023-10-06 DIAGNOSIS — S93402S Sprain of unspecified ligament of left ankle, sequela: Secondary | ICD-10-CM

## 2023-10-07 ENCOUNTER — Ambulatory Visit: Payer: Self-pay | Admitting: Nurse Practitioner

## 2023-10-07 NOTE — Progress Notes (Deleted)
 There were no vitals taken for this visit.   Subjective:    Patient ID: Megan Bennett, female    DOB: [birth year], 38 y.o.   MRN: [id number]  HPI: Megan Bennett is a 38 y.o. female  No chief complaint on file.  ANXIETY Patient states her anxiety is pretty good.  Patient states she feels like the Cymbalta  is helping her mood.  The cymbalta  helps with her fibromyalgia pain.  Feels like taking two during flares will help her.  Doesn't feel like it helps with her pain.  Doing well.  Denies SI.     09/03/2022    4:17 PM 07/22/2022    2:46 PM 02/25/2022    2:04 PM 01/14/2022    3:12 PM  GAD 7 : Generalized Anxiety Score  Nervous, Anxious, on Edge 0 1 1 1   Control/stop worrying 0 0 0 0  Worry too much - different things 0 0 0 0  Trouble relaxing 0 0 0 0  Restless 0 0 0 0  Easily annoyed or irritable 0 0 0 0  Afraid - awful might happen 0 0 0 0  Total GAD 7 Score 0 1 1 1   Anxiety Difficulty Not difficult at all Somewhat difficult  Not difficult at all   [facility] Visit from 09/03/2022 in [facility]  PHQ-9 Total Score 0       HYPOTHYROIDISM Thyroid  control status:controlled Satisfied with current treatment? yes Medication side effects: no Medication compliance: excellent compliance Etiology of hypothyroidism:  Recent dose adjustment:no Fatigue: yes Cold intolerance: no Heat intolerance: no Weight gain: no Weight loss: no Constipation: no Diarrhea/loose stools: no Palpitations: no Lower extremity edema: no Anxiety/depressed mood: yes   Relevant past medical, surgical, family and social history reviewed and updated as indicated. Interim medical history since our last visit reviewed. Allergies and medications reviewed and updated.  Review of Systems  Per HPI unless specifically indicated above     Objective:    There were no vitals taken for this visit.  Wt Readings from Last 3 Encounters:  10/06/23 273 lb  (123.8 kg)  05/22/23 273 lb (123.8 kg)  04/08/23 273 lb (123.8 kg)    Physical Exam  Results for orders placed or performed in visit on 04/08/23  Cytology - PAP   Collection Time: 04/08/23  3:23 PM  Result Value Ref Range   Adequacy      Satisfactory for evaluation; transformation zone component PRESENT.   Diagnosis      - Negative for intraepithelial lesion or malignancy (NILM)  Urinalysis, Routine w reflex microscopic   Collection Time: 04/08/23  3:39 PM  Result Value Ref Range   Specific Gravity, UA >1.030 (H) 1.005 - 1.030   pH, UA 6.0 5.0 - 7.5   Color, UA Yellow Yellow   Appearance Ur Clear Clear   Leukocytes,UA Negative Negative   Protein,UA Trace (A) Negative/Trace   Glucose, UA Negative Negative   Ketones, UA Trace (A) Negative   RBC, UA Negative Negative   Bilirubin, UA Negative Negative   Urobilinogen, Ur 0.2 0.2 - 1.0 mg/dL   Nitrite, UA Negative Negative   Microscopic Examination Comment   CBC with Differential/Platelet   Collection Time: 04/08/23  3:40 PM  Result Value Ref Range   WBC 10.0 3.4 - 10.8 x10E3/uL   RBC 4.45 3.77 - 5.28 x10E6/uL   Hemoglobin 14.8 11.1 - 15.9 g/dL   Hematocrit 95.6 21.3 - 46.6 %   MCV  98 (H) 79 - 97 fL   MCH 33.3 (H) 26.6 - 33.0 pg   MCHC 34.1 31.5 - 35.7 g/dL   RDW 16.1 09.6 - 04.5 %   Platelets 355 150 - 450 x10E3/uL   Neutrophils 55 Not Estab. %   Lymphs 34 Not Estab. %   Monocytes 7 Not Estab. %   Eos 3 Not Estab. %   Basos 1 Not Estab. %   Neutrophils Absolute 5.5 1.4 - 7.0 x10E3/uL   Lymphocytes Absolute 3.4 (H) 0.7 - 3.1 x10E3/uL   Monocytes Absolute 0.7 0.1 - 0.9 x10E3/uL   EOS (ABSOLUTE) 0.3 0.0 - 0.4 x10E3/uL   Basophils Absolute 0.1 0.0 - 0.2 x10E3/uL   Immature Granulocytes 0 Not Estab. %   Immature Grans (Abs) 0.0 0.0 - 0.1 x10E3/uL  Comprehensive metabolic panel   Collection Time: 04/08/23  3:40 PM  Result Value Ref Range   Glucose 112 (H) 70 - 99 mg/dL   BUN 15 6 - 20 mg/dL   Creatinine, Ser 4.09 0.57  - 1.00 mg/dL   eGFR 811 >91 YN/WGN/5.62   BUN/Creatinine Ratio 21 9 - 23   Sodium 142 134 - 144 mmol/L   Potassium 4.4 3.5 - 5.2 mmol/L   Chloride 107 (H) 96 - 106 mmol/L   CO2 18 (L) 20 - 29 mmol/L   Calcium  9.4 8.7 - 10.2 mg/dL   Total Protein 6.6 6.0 - 8.5 g/dL   Albumin 4.1 3.9 - 4.9 g/dL   Globulin, Total 2.5 1.5 - 4.5 g/dL   Bilirubin Total 0.4 0.0 - 1.2 mg/dL   Alkaline Phosphatase 100 44 - 121 IU/L   AST 14 0 - 40 IU/L   ALT 15 0 - 32 IU/L  Lipid panel   Collection Time: 04/08/23  3:40 PM  Result Value Ref Range   Cholesterol, Total 178 100 - 199 mg/dL   Triglycerides 130 0 - 149 mg/dL   HDL 58 >86 mg/dL   VLDL Cholesterol Cal 22 5 - 40 mg/dL   LDL Chol Calc (NIH) 98 0 - 99 mg/dL   Chol/HDL Ratio 3.1 0.0 - 4.4 ratio  TSH   Collection Time: 04/08/23  3:40 PM  Result Value Ref Range   TSH 10.700 (H) 0.450 - 4.500 uIU/mL  T4, free   Collection Time: 04/08/23  3:40 PM  Result Value Ref Range   Free T4 0.93 0.82 - 1.77 ng/dL      Assessment & Plan:   Problem List Items Addressed This Visit   None    Follow up plan: No follow-ups on file.

## 2023-10-08 NOTE — Progress Notes (Signed)
  Subjective:  Patient ID: Megan Bennett, female    DOB: [birth year],  MRN: [id number]  Chief Complaint  Patient presents with   Foot Injury    RM6: Broken big toe on right foot and left foot and ankle (fell and hurts bad. Top of left foot is burning)after injury falling in hole in back yard    38 y.o. female presents with the above complaint. History confirmed with patient.  She notes improvement has worn the boot is much as possible for the toe fracture.  Says it still swells and hurts some but has improved.  Rolled her ankle last weekend  Objective:  Physical Exam: warm, good capillary refill, no trophic changes or ulcerative lesions, normal DP and PT pulses, normal sensory exam, and today minimal tenderness in the right great toe with some edema still present, left ankle pain over the ATFL no instability  Radiographs: Multiple views x-ray of the right foot taken today show good increase in consolidation across fracture site of the distal phalanx base without joint abnormality, the left ankle x-rays showed no fracture or dislocation Assessment:   1. Nondisplaced fracture of distal phalanx of right great toe, initial encounter for closed fracture   2. Moderate ankle sprain, left, sequela      Plan:  Patient was evaluated and treated and all questions answered.  Discussed the ankle sprain on the left foot and recommended RICE and use of a lace up ankle brace which she has.  She likely would benefit from wearing her CAM boot on the side for 1 week to allow to rest.  Regarding her fracture of the right great toe this has improved and she has increased healing.  Not see indication for surgical intervention at this point.  May transition back to regular shoe gear as tolerated.  Follow-up as needed. Return if symptoms worsen or fail to improve.

## 2023-11-03 ENCOUNTER — Other Ambulatory Visit: Payer: Self-pay | Admitting: Nurse Practitioner

## 2023-11-06 NOTE — Telephone Encounter (Signed)
 Courtesy refill. Patient will need an office visit for additional refills. Requested Prescriptions  Pending Prescriptions Disp Refills   DULoxetine  (CYMBALTA ) 60 MG capsule [Pharmacy Med Name: DULOXETINE  DR 60MG  CAPSULES] 60 capsule 0    Sig: TAKE 1 CAPSULE BY MOUTH TWICE DAILY     Psychiatry: Antidepressants - SNRI - duloxetine  Failed - 11/06/2023 12:08 PM      Failed - Valid encounter within last 6 months    Recent Outpatient Visits   None            Passed - Cr in normal range and within 360 days    Creatinine, Ser  Date Value Ref Range Status  04/08/2023 0.72 0.57 - 1.00 mg/dL Final   Creatinine, Urine  Date Value Ref Range Status  06/07/2018 16 mg/dL Final         Passed - eGFR is 30 or above and within 360 days    GFR calc Af Amer  Date Value Ref Range Status  08/10/2018 >60 >60 mL/min Final   GFR calc non Af Amer  Date Value Ref Range Status  08/10/2018 >60 >60 mL/min Final   eGFR  Date Value Ref Range Status  04/08/2023 109 >59 mL/min/1.73 Final         Passed - Completed PHQ-2 or PHQ-9 in the last 360 days      Passed - Last BP in normal range    BP Readings from Last 1 Encounters:  04/08/23 117/81

## 2023-11-06 NOTE — Telephone Encounter (Signed)
 Called pt to schedule ov. Left message on machine.

## 2023-11-18 ENCOUNTER — Encounter: Payer: Self-pay | Admitting: Nurse Practitioner

## 2023-11-18 ENCOUNTER — Ambulatory Visit: Admitting: Nurse Practitioner

## 2023-11-18 VITALS — BP 120/82 | HR 98 | Ht 67.0 in | Wt 277.0 lb

## 2023-11-18 DIAGNOSIS — Z6841 Body Mass Index (BMI) 40.0 and over, adult: Secondary | ICD-10-CM | POA: Diagnosis not present

## 2023-11-18 DIAGNOSIS — M797 Fibromyalgia: Secondary | ICD-10-CM

## 2023-11-18 DIAGNOSIS — E039 Hypothyroidism, unspecified: Secondary | ICD-10-CM | POA: Diagnosis not present

## 2023-11-18 MED ORDER — WEGOVY 0.25 MG/0.5ML ~~LOC~~ SOAJ: 0.2500 mg | SUBCUTANEOUS | 0 refills | Status: DC

## 2023-11-18 MED ORDER — IBUPROFEN 800 MG PO TABS: 800.0000 mg | ORAL_TABLET | Freq: Three times a day (TID) | ORAL | 1 refills | Status: DC | PRN

## 2023-11-18 MED ORDER — POLYETHYLENE GLYCOL 3350 17 G PO PACK: 17.0000 g | PACK | Freq: Every day | ORAL | 1 refills | Status: AC

## 2023-11-18 MED ORDER — LEVOTHYROXINE SODIUM 25 MCG PO TABS: ORAL_TABLET | ORAL | 3 refills | Status: DC

## 2023-11-18 MED ORDER — WEGOVY 0.5 MG/0.5ML ~~LOC~~ SOAJ: 0.5000 mg | SUBCUTANEOUS | 2 refills | Status: DC

## 2023-11-18 MED ORDER — DULOXETINE HCL 60 MG PO CPEP: 60.0000 mg | ORAL_CAPSULE | Freq: Two times a day (BID) | ORAL | 0 refills | Status: DC

## 2023-11-18 MED ORDER — BUDESONIDE-FORMOTEROL FUMARATE 160-4.5 MCG/ACT IN AERO: 2.0000 | INHALATION_SPRAY | Freq: Two times a day (BID) | RESPIRATORY_TRACT | 3 refills | Status: DC

## 2023-11-18 NOTE — Assessment & Plan Note (Signed)
 Chronic.  Not well controlled.  Patient has tried diet and exercise for weight loss without success.  Will start [facility] 0.25mg  weekly.  Will increase to [facility] 0.5mg  weekly after the first 4 weeks.  Discussed how to inject medication.  Discussed side effects and benefits of medication.  Follow up in 2 months.  Call sooner if concerns arise.

## 2023-11-18 NOTE — Assessment & Plan Note (Signed)
 Chronic.  Controlled.  Continue with current medication regimen of Levothyroxine  25mcg daily.  Has not been taking her medication regularly.  Labs ordered today.  Return to clinic in 6 months for reevaluation.  Call sooner if concerns arise.

## 2023-11-18 NOTE — Progress Notes (Addendum)
 BP 120/82   Pulse 98   Ht 5' 7 (1.702 m)   Wt 277 lb (125.6 kg)   LMP 08/18/2023   BMI 43.38 kg/m    Subjective:    Patient ID: Megan Bennett, female    DOB: 24-Sep-1985, 37 y.o.   MRN: [id number]  HPI: Megan Bennett is a 37 y.o. female  Chief Complaint  Patient presents with   Medical Management of Chronic Issues   ANXIETY Patient states her anxiety goes through the roof at the grocery store.  Any other store she is fine with.  She likes the dose of the Cymbalta .  The cymbalta  helps with her fibromyalgia pain.  Doing well.  Denies SI.     09/03/2022    4:17 PM 07/22/2022    2:46 PM 02/25/2022    2:04 PM 01/14/2022    3:12 PM  GAD 7 : Generalized Anxiety Score  Nervous, Anxious, on Edge 0 1 1 1   Control/stop worrying 0 0 0 0  Worry too much - different things 0 0 0 0  Trouble relaxing 0 0 0 0  Restless 0 0 0 0  Easily annoyed or irritable 0 0 0 0  Afraid - awful might happen 0 0 0 0  Total GAD 7 Score 0 1 1 1   Anxiety Difficulty Not difficult at all Somewhat difficult  Not difficult at all   [facility] Office Visit from 09/03/2022 in [facility]  PHQ-9 Total Score 0       HYPOTHYROIDISM Thyroid  control status:controlled Satisfied with current treatment? yes Medication side effects: no Medication compliance: excellent compliance Etiology of hypothyroidism:  Recent dose adjustment:no Fatigue: yes Cold intolerance: no Heat intolerance: no Weight gain: no Weight loss: no Constipation: no Diarrhea/loose stools: no Palpitations: no Lower extremity edema: no Anxiety/depressed mood: yes  Clinical coverage for weight loss GLP's   Medication being dispensed is Wegovy  3 mL/28 day. Titration doses are 2 mL/28 days.  She is currently working on diet and exercise.  [x]  Product being prescribed is FDA approved for the indication, age, weight (if applicable) and not does not exceed dosing limits per the Prescribing  Information per the clinical conditions for use.  [x]  Patient's baseline weight measured within the last 45 days as required by provider before dispensing.  [x]  Patient is new to therapy and One of the following:   [x]  The beneficiary is 40 years of age or over and has ONE of the following:  [x]  A BMI greater than or equal to 30 kg/m2  []  A BMI greater than or equal to 27 kg/m2 with at least one weight-related comorbidity/risk factor/complication (i.e. hypertension, type 2 diabetes, obstructive sleep apnea, cardiovascular disease, dyslipidemia)  If patient has one weight-related comorbidity/risk factor/complication (i.e. hypertension, type 2 diabetes, obstructive sleep apnea, cardiovascular disease, dyslipidemia), please list fibromyalgia Patient suffers from weight-related comorbidity/risk factor/complication    Last BMI/Weight/Height recorded Estimated body mass index is 43.38 kg/m as calculated from the following:   Height as of this encounter: 5' 7 (1.702 m).   Weight as of this encounter: 277 lb (125.6 kg).    Relevant past medical, surgical, family and social history reviewed and updated as indicated. Interim medical history since our last visit reviewed. Allergies and medications reviewed and updated.  Review of Systems  Constitutional:  Positive for unexpected weight change.  Musculoskeletal:  Positive for arthralgias and myalgias.  Psychiatric/Behavioral:  The patient is nervous/anxious.     Per HPI  unless specifically indicated above     Objective:     BP 120/82   Pulse 98   Ht 5' 7 (1.702 m)   Wt 277 lb (125.6 kg)   LMP 08/18/2023   BMI 43.38 kg/m   Wt Readings from Last 3 Encounters:  11/18/23 277 lb (125.6 kg)  10/06/23 273 lb (123.8 kg)  05/22/23 273 lb (123.8 kg)    Physical Exam Vitals and nursing note reviewed.  Constitutional:      General: She is not in acute distress.    Appearance: Normal appearance. She is obese. She is not ill-appearing,  toxic-appearing or diaphoretic.  HENT:     Head: Normocephalic.     Right Ear: External ear normal.     Left Ear: External ear normal.     Nose: Nose normal.     Mouth/Throat:     Mouth: Mucous membranes are moist.     Pharynx: Oropharynx is clear.   Eyes:     General:        Right eye: No discharge.        Left eye: No discharge.     Extraocular Movements: Extraocular movements intact.     Conjunctiva/sclera: Conjunctivae normal.     Pupils: Pupils are equal, round, and reactive to light.    Cardiovascular:     Rate and Rhythm: Normal rate and regular rhythm.     Heart sounds: No murmur heard. Pulmonary:     Effort: Pulmonary effort is normal. No respiratory distress.     Breath sounds: Normal breath sounds. No wheezing or rales.   Musculoskeletal:     Cervical back: Normal range of motion and neck supple.   Skin:    General: Skin is warm and dry.     Capillary Refill: Capillary refill takes less than 2 seconds.   Neurological:     General: No focal deficit present.     Mental Status: She is alert and oriented to person, place, and time. Mental status is at baseline.   Psychiatric:        Mood and Affect: Mood normal.        Behavior: Behavior normal.        Thought Content: Thought content normal.        Judgment: Judgment normal.     Results for orders placed or performed in visit on 11/18/23  TSH   Collection Time: 11/18/23  1:36 PM  Result Value Ref Range   TSH 7.930 (H) 0.450 - 4.500 uIU/mL  T4, free   Collection Time: 11/18/23  1:36 PM  Result Value Ref Range   Free T4 0.83 0.82 - 1.77 ng/dL      Assessment & Plan:   Problem List Items Addressed This Visit       Endocrine   Acquired hypothyroidism - Primary   Chronic.  Controlled.  Continue with current medication regimen of Levothyroxine  25mcg daily.  Has not been taking her medication regularly.  Labs ordered today.  Return to clinic in 6 months for reevaluation.  Call sooner if concerns arise.        Relevant Medications   levothyroxine  (SYNTHROID ) 25 MCG tablet   Other Relevant Orders   TSH (Completed)   T4, free (Completed)     Other   Fibromyalgia   Chronic.  Controlled.  Continue with current medication regimen of Duloxetine .  Labs ordered today.  Return to clinic in 6 months for reevaluation.  Call sooner if concerns arise.  Relevant Medications   DULoxetine  (CYMBALTA ) 60 MG capsule   ibuprofen  (ADVIL ) 800 MG tablet   BMI 40.0-44.9, adult (HCC)   Chronic.  Not well controlled.  Patient has tried diet and exercise for weight loss without success.  Will start Wegovy  0.25mg  weekly.  Will increase to Wegovy  0.5mg  weekly after the first 4 weeks.  Discussed how to inject medication.  Discussed side effects and benefits of medication.  Follow up in 2 months.  Call sooner if concerns arise.        Relevant Medications   Semaglutide -Weight Management (WEGOVY ) 0.25 MG/0.5ML SOAJ   Semaglutide -Weight Management (WEGOVY ) 0.5 MG/0.5ML SOAJ     Follow up plan: Return in about 2 months (around 01/18/2024) for Weight Managment (virtual).

## 2023-11-18 NOTE — Assessment & Plan Note (Signed)
Chronic.  Controlled.  Continue with current medication regimen of Duloxetine.  Labs ordered today.  Return to clinic in 6 months for reevaluation.  Call sooner if concerns arise.   

## 2023-11-19 ENCOUNTER — Ambulatory Visit: Payer: Self-pay | Admitting: Nurse Practitioner

## 2023-11-19 LAB — TSH: TSH: 7.93 u[IU]/mL — ABNORMAL HIGH (ref 0.450–4.500)

## 2023-11-19 LAB — T4, FREE: Free T4: 0.83 ng/dL (ref 0.82–1.77)

## 2023-11-21 ENCOUNTER — Telehealth: Payer: Self-pay | Admitting: Pharmacy Technician

## 2023-11-21 ENCOUNTER — Other Ambulatory Visit (HOSPITAL_COMMUNITY): Payer: Self-pay

## 2023-11-21 NOTE — Telephone Encounter (Signed)
 Pharmacy Patient Advocate Encounter   Received notification from Onbase that prior authorization for Wegovy  0.25mg /0.28ml is required/requested.   Insurance verification completed.   The patient is insured through [facility] .   Per test claim: PA required; PA started via CoverMyMeds. KEY [id number] . Please see clinical question(s) below that I am not finding the answer to in their chart and advise.    Can provider please add this to office visit notes because without it, this PA will get denied.

## 2023-11-21 NOTE — Telephone Encounter (Signed)
 Pharmacy Patient Advocate Encounter  Received notification from [facility] that Prior Authorization for Wegovy  0.25MG /0.5ML auto-injectors has been APPROVED from 11/21/23 to 05/19/24. Ran test claim, Copay is $4.00. This test claim was processed through [facility]- copay amounts may vary at other pharmacies due to pharmacy/plan contracts, or as the patient moves through the different stages of their insurance plan.   PA #/Case ID/Reference #: [id number]

## 2023-11-21 NOTE — Telephone Encounter (Signed)
I updated the note

## 2023-11-21 NOTE — Telephone Encounter (Signed)
 Thank you :)

## 2023-11-21 NOTE — Telephone Encounter (Signed)
 Pharmacy Patient Advocate Encounter   Received notification from CoverMyMeds that prior authorization for Wegovy  0.25MG /0.5ML auto-injectors is required/requested.   Insurance verification completed.   The patient is insured through [facility] .   Per test claim: PA required; PA submitted to above mentioned insurance via CoverMyMeds Key/confirmation #/EOC [facility] Status is pending

## 2023-12-18 ENCOUNTER — Other Ambulatory Visit: Payer: Self-pay | Admitting: Nurse Practitioner

## 2023-12-20 NOTE — Telephone Encounter (Signed)
 Reordered 11/18/23 #60  Requested Prescriptions  Refused Prescriptions Disp Refills   DULoxetine  (CYMBALTA ) 60 MG capsule [Pharmacy Med Name: DULOXETINE  DR 60MG  CAPSULES] 60 capsule 0    Sig: TAKE 1 CAPSULE(60 MG) BY MOUTH TWICE DAILY     Psychiatry: Antidepressants - SNRI - duloxetine  Passed - 12/20/2023 11:54 AM      Passed - Cr in normal range and within 360 days    Creatinine, Ser  Date Value Ref Range Status  04/08/2023 0.72 0.57 - 1.00 mg/dL Final   Creatinine, Urine  Date Value Ref Range Status  06/07/2018 16 mg/dL Final         Passed - eGFR is 30 or above and within 360 days    GFR calc Af Amer  Date Value Ref Range Status  08/10/2018 >60 >60 mL/min Final   GFR calc non Af Amer  Date Value Ref Range Status  08/10/2018 >60 >60 mL/min Final   eGFR  Date Value Ref Range Status  04/08/2023 109 >59 mL/min/1.73 Final         Passed - Completed PHQ-2 or PHQ-9 in the last 360 days      Passed - Last BP in normal range    BP Readings from Last 1 Encounters:  11/18/23 120/82         Passed - Valid encounter within last 6 months    Recent Outpatient Visits           1 month ago Acquired hypothyroidism   [place] [facility] Melvin Pao, NP

## 2023-12-30 ENCOUNTER — Other Ambulatory Visit: Payer: Self-pay | Admitting: Nurse Practitioner

## 2023-12-30 NOTE — Telephone Encounter (Unsigned)
 Copied from CRM [id number]. Topic: Clinical - Medication Refill >> Dec 30, 2023  4:38 PM Wess RAMAN wrote: Medication: budesonide -formoterol  (SYMBICORT ) 160-4.5 MCG/ACT inhaler   Has the patient contacted their pharmacy? Yes (Agent: If no, request that the patient contact the pharmacy for the refill. If patient does not wish to contact the pharmacy document the reason why and proceed with request.) (Agent: If yes, when and what did the pharmacy advise?)  This is the patient's preferred pharmacy:  [id number] [place], [place] - [address] AT [facility] [address] [place] [address] Phone: [phone] Fax: [phone]  Is this the correct pharmacy for this prescription? Yes If no, delete pharmacy and type the correct one.   Has the prescription been filled recently? Yes  Is the patient out of the medication? Yes  Has the patient been seen for an appointment in the last year OR does the patient have an upcoming appointment? Yes  Can we respond through MyChart? Yes  Agent: Please be advised that Rx refills may take up to 3 business days. We ask that you follow-up with your pharmacy.

## 2024-01-01 NOTE — Telephone Encounter (Signed)
 Requested Prescriptions  Refused Prescriptions Disp Refills   budesonide -formoterol  (SYMBICORT ) 160-4.5 MCG/ACT inhaler 1 each 3    Sig: Inhale 2 puffs into the lungs 2 (two) times daily. Take 2 puffs bid for 2 weeks and then can take 2 puff prn SOB in the same way you took Albuterol      Pulmonology:  Combination Products Passed - 01/01/2024  3:10 PM      Passed - Valid encounter within last 12 months    Recent Outpatient Visits           1 month ago Acquired hypothyroidism   [place] [facility] Melvin Pao, NP

## 2024-01-26 ENCOUNTER — Encounter: Payer: Self-pay | Admitting: Nurse Practitioner

## 2024-01-26 ENCOUNTER — Telehealth (INDEPENDENT_AMBULATORY_CARE_PROVIDER_SITE_OTHER): Admitting: Nurse Practitioner

## 2024-01-26 DIAGNOSIS — Z713 Dietary counseling and surveillance: Secondary | ICD-10-CM

## 2024-01-26 DIAGNOSIS — Z6841 Body Mass Index (BMI) 40.0 and over, adult: Secondary | ICD-10-CM

## 2024-01-26 MED ORDER — DULOXETINE HCL 60 MG PO CPEP: 60.0000 mg | ORAL_CAPSULE | Freq: Two times a day (BID) | ORAL | 1 refills | Status: DC

## 2024-01-26 MED ORDER — WEGOVY 1 MG/0.5ML ~~LOC~~ SOAJ: 1.0000 mg | SUBCUTANEOUS | 2 refills | Status: DC

## 2024-01-26 NOTE — Progress Notes (Signed)
 Wt 274 lb (124.3 kg) Comment: patient obtained  BMI 42.91 kg/m    Subjective:    Patient ID: Megan Bennett, female    DOB: Jan 29, 1986, 38 y.o.   MRN: [id number]  HPI: Megan Bennett is a 38 y.o. female  Chief Complaint  Patient presents with   Weight Management Screening   WEIGHT GAIN Patient has been a lot more active.  Has lost about 3lbs since starting wegovy .  She is drinking a lot of water.  She does have a little bit of nausea and constipation.  Duration: years Previous attempts at weight loss: yes Complications of obesity: hypothyroid and fibromyalgia  Peak weight: 277lb Weight loss goal:  Weight loss to date:  Requesting obesity pharmacotherapy: no Current weight loss supplements/medications: yes Previous weight loss supplements/meds: yes Calories:   Relevant past medical, surgical, family and social history reviewed and updated as indicated. Interim medical history since our last visit reviewed. Allergies and medications reviewed and updated.  Review of Systems  Constitutional:  Negative for unexpected weight change.    Per HPI unless specifically indicated above     Objective:    Wt 274 lb (124.3 kg) Comment: patient obtained  BMI 42.91 kg/m   Wt Readings from Last 3 Encounters:  01/26/24 274 lb (124.3 kg)  11/18/23 277 lb (125.6 kg)  10/06/23 273 lb (123.8 kg)    Physical Exam Vitals and nursing note reviewed.  HENT:     Head: Normocephalic.     Right Ear: Hearing normal.     Left Ear: Hearing normal.     Nose: Nose normal.  Eyes:     Pupils: Pupils are equal, round, and reactive to light.  Pulmonary:     Effort: Pulmonary effort is normal. No respiratory distress.  Neurological:     Mental Status: She is alert.  Psychiatric:        Mood and Affect: Mood normal.        Behavior: Behavior normal.        Thought Content: Thought content normal.        Judgment: Judgment normal.     Results for orders placed or performed  in visit on 11/18/23  TSH   Collection Time: 11/18/23  1:36 PM  Result Value Ref Range   TSH 7.930 (H) 0.450 - 4.500 uIU/mL  T4, free   Collection Time: 11/18/23  1:36 PM  Result Value Ref Range   Free T4 0.83 0.82 - 1.77 ng/dL      Assessment & Plan:   Problem List Items Addressed This Visit       Other   BMI 40.0-44.9, adult (HCC) - Primary   Chronic.  Not well controlled.  Patient has tried diet and exercise for weight loss without success.  Will increase to Wegovy  1mg  weekly.  Discussed side effects and benefits of medication.  Follow up in 6 weeks.  Call sooner if concerns arise.       Relevant Medications   semaglutide -weight management (WEGOVY ) 1 MG/0.5ML SOAJ SQ injection     Follow up plan: Return in about 6 weeks (around 03/08/2024) for Weight Managment (virtual).   This visit was completed via MyChart due to the restrictions of the COVID-19 pandemic. All issues as above were discussed and addressed. Physical exam was done as above through visual confirmation on MyChart. If it was felt that the patient should be evaluated in the office, they were directed there. The patient verbally consented to this visit. Location  of the patient: Home Location of the provider: Office Those involved with this call:  Provider: Darice Petty, NP CMA: Cena Maffucci, CMA Front Desk/Registration: Claretta Maiden This encounter was conducted via video.  I spent 30 minutes dedicated to the care of this patient on the date of this encounter to include previsit review of plan of care, medications and follow up, face to face time with the patient, and post visit ordering of testing.

## 2024-01-26 NOTE — Progress Notes (Signed)
 Scheduled

## 2024-01-26 NOTE — Assessment & Plan Note (Signed)
 Chronic.  Not well controlled.  Patient has tried diet and exercise for weight loss without success.  Will increase to Wegovy  1mg  weekly.  Discussed side effects and benefits of medication.  Follow up in 6 weeks.  Call sooner if concerns arise.

## 2024-03-08 ENCOUNTER — Telehealth (INDEPENDENT_AMBULATORY_CARE_PROVIDER_SITE_OTHER): Admitting: Nurse Practitioner

## 2024-03-08 ENCOUNTER — Encounter: Payer: Self-pay | Admitting: Nurse Practitioner

## 2024-03-08 DIAGNOSIS — R051 Acute cough: Secondary | ICD-10-CM

## 2024-03-08 DIAGNOSIS — Z6841 Body Mass Index (BMI) 40.0 and over, adult: Secondary | ICD-10-CM | POA: Diagnosis not present

## 2024-03-08 MED ORDER — WEGOVY 1.7 MG/0.75ML ~~LOC~~ SOAJ: 1.7000 mg | SUBCUTANEOUS | 0 refills | Status: DC

## 2024-03-08 MED ORDER — DULOXETINE HCL 60 MG PO CPEP: 60.0000 mg | ORAL_CAPSULE | Freq: Two times a day (BID) | ORAL | 1 refills | Status: DC

## 2024-03-08 MED ORDER — AZITHROMYCIN 250 MG PO TABS: ORAL_TABLET | ORAL | 0 refills | Status: AC

## 2024-03-08 NOTE — Progress Notes (Signed)
 There were no vitals taken for this visit.   Subjective:    Patient ID: Megan Bennett, female    DOB: 10/11/1985, 38 y.o.   MRN: [id number]  HPI: Megan Bennett is a 38 y.o. female  Chief Complaint  Patient presents with   weight managment    URI    Hurts to breathe ongoing for about 5 days been around sick family members    WEIGHT GAIN Patient has been a lot more active. Wegovy  is no longer going to be covered after October 1st.  Current weight of 268.8 Duration: years Previous attempts at weight loss: yes Complications of obesity: hypothyroid and fibromyalgia  Peak weight: 277lb Weight loss goal:  Weight loss to date:  Requesting obesity pharmacotherapy: no Current weight loss supplements/medications: yes Previous weight loss supplements/meds: yes Calories:   UPPER RESPIRATORY TRACT INFECTION Worst symptom: symptoms for 5 day Fever: no Cough: yes Shortness of breath: yes Wheezing: yes Chest pain: yes Chest tightness: no Chest congestion: no Nasal congestion: yes Runny nose: yes Post nasal drip: yes Sneezing: no Sore throat: yes Swollen glands: no Sinus pressure: yes Headache: yes Face pain: yes Toothache: yes Ear pain: no bilateral Ear pressure: no bilateral Eyes red/itching:no Eye drainage/crusting: no  Vomiting: no Rash: no Fatigue: no Sick contacts: yes Strep contacts: no  Context: stable Recurrent sinusitis: no Relief with OTC cold/cough medications: yes  Treatments attempted: cold/sinus     Relevant past medical, surgical, family and social history reviewed and updated as indicated. Interim medical history since our last visit reviewed. Allergies and medications reviewed and updated.  Review of Systems  Constitutional:  Negative for fatigue, fever and unexpected weight change.  HENT:  Positive for congestion, dental problem, postnasal drip, rhinorrhea, sinus pressure, sinus pain, sneezing and sore throat. Negative for ear  pain.   Respiratory:  Positive for cough, shortness of breath and wheezing.   Cardiovascular:  Positive for chest pain.  Gastrointestinal:  Negative for vomiting.  Skin:  Negative for rash.  Neurological:  Positive for headaches.    Per HPI unless specifically indicated above     Objective:    There were no vitals taken for this visit.  Wt Readings from Last 3 Encounters:  01/26/24 274 lb (124.3 kg)  11/18/23 277 lb (125.6 kg)  10/06/23 273 lb (123.8 kg)    Physical Exam Vitals and nursing note reviewed.  HENT:     Head: Normocephalic.     Right Ear: Hearing normal.     Left Ear: Hearing normal.     Nose: Nose normal.  Eyes:     Pupils: Pupils are equal, round, and reactive to light.  Pulmonary:     Effort: Pulmonary effort is normal. No respiratory distress.  Neurological:     Mental Status: She is alert.  Psychiatric:        Mood and Affect: Mood normal.        Behavior: Behavior normal.        Thought Content: Thought content normal.        Judgment: Judgment normal.     Results for orders placed or performed in visit on 11/18/23  TSH   Collection Time: 11/18/23  1:36 PM  Result Value Ref Range   TSH 7.930 (H) 0.450 - 4.500 uIU/mL  T4, free   Collection Time: 11/18/23  1:36 PM  Result Value Ref Range   Free T4 0.83 0.82 - 1.77 ng/dL      Assessment & Plan:  Problem List Items Addressed This Visit       Other   BMI 40.0-44.9, adult (HCC)   Chronic. Not well controlled. Will increase dose of Wegovy  to 1.7mg  weekly.  Will not have coverage of medication after October 1.      Relevant Medications   semaglutide -weight management (WEGOVY ) 1.7 MG/0.75ML SOAJ SQ injection   Other Visit Diagnoses       Acute cough    -  Primary   Will treat with zpack.  Complete course of medication. Follow up if not improved.         Follow up plan: Return in about 3 months (around 06/07/2024) for HTN, HLD, DM2 FU.   This visit was completed via MyChart due to  the restrictions of the COVID-19 pandemic. All issues as above were discussed and addressed. Physical exam was done as above through visual confirmation on MyChart. If it was felt that the patient should be evaluated in the office, they were directed there. The patient verbally consented to this visit. Location of the patient: Home Location of the provider: Office Those involved with this call:  Provider: Darice Petty, NP CMA: Cena Maffucci, CMA Front Desk/Registration: Claretta Maiden This encounter was conducted via video.  I spent 30 minutes dedicated to the care of this patient on the date of this encounter to include previsit review of plan of care, medications and follow up, face to face time with the patient, and post visit ordering of testing.

## 2024-03-08 NOTE — Assessment & Plan Note (Signed)
 Chronic. Not well controlled. Will increase dose of Wegovy  to 1.7mg  weekly.  Will not have coverage of medication after October 1.

## 2024-05-13 ENCOUNTER — Other Ambulatory Visit (HOSPITAL_COMMUNITY): Payer: Self-pay

## 2024-05-13 ENCOUNTER — Other Ambulatory Visit: Payer: Self-pay | Admitting: Nurse Practitioner

## 2024-05-15 NOTE — Telephone Encounter (Signed)
 Requested Prescriptions  Pending Prescriptions Disp Refills   ibuprofen  (ADVIL ) 800 MG tablet [Pharmacy Med Name: IBUPROFEN  800MG  TABLETS] 90 tablet 1    Sig: TAKE 1 TABLET(800 MG) BY MOUTH EVERY 8 HOURS AS NEEDED     Analgesics:  NSAIDS Failed - 05/15/2024  8:42 PM      Failed - Manual Review: Labs are only required if the patient has taken medication for more than 8 weeks.      Failed - Cr in normal range and within 360 days    Creatinine, Ser  Date Value Ref Range Status  04/08/2023 0.72 0.57 - 1.00 mg/dL Final   Creatinine, Urine  Date Value Ref Range Status  06/07/2018 16 mg/dL Final         Failed - HGB in normal range and within 360 days    Hemoglobin  Date Value Ref Range Status  04/08/2023 14.8 11.1 - 15.9 g/dL Final         Failed - PLT in normal range and within 360 days    Platelets  Date Value Ref Range Status  04/08/2023 355 150 - 450 x10E3/uL Final         Failed - HCT in normal range and within 360 days    Hematocrit  Date Value Ref Range Status  04/08/2023 43.4 34.0 - 46.6 % Final         Failed - eGFR is 30 or above and within 360 days    GFR calc Af Amer  Date Value Ref Range Status  08/10/2018 >60 >60 mL/min Final   GFR calc non Af Amer  Date Value Ref Range Status  08/10/2018 >60 >60 mL/min Final   eGFR  Date Value Ref Range Status  04/08/2023 109 >59 mL/min/1.73 Final         Passed - Patient is not pregnant      Passed - Valid encounter within last 12 months    Recent Outpatient Visits           2 months ago Acute cough   [facility] [facility] Melvin Pao, NP   3 months ago Morbid obesity [facility])   [place] [facility] Melvin Pao, NP   5 months ago Acquired hypothyroidism   [place] [facility] Melvin Pao, NP              Refused Prescriptions Disp Refills   SUMAtriptan  (IMITREX ) 100 MG tablet [Pharmacy Med Name: SUMATRIPTAN  100MG  TABLETS] 10 tablet 1    Sig:  TAKE 1 TABLET(100 MG) BY MOUTH DAILY AS NEEDED     Neurology:  Migraine Therapy - Triptan Passed - 05/15/2024  8:42 PM      Passed - Last BP in normal range    BP Readings from Last 1 Encounters:  11/18/23 120/82         Passed - Valid encounter within last 12 months    Recent Outpatient Visits           2 months ago Acute cough   [place] [facility] Melvin Pao, NP   3 months ago Morbid obesity [facility])   [place] [facility] Melvin Pao, NP   5 months ago Acquired hypothyroidism   [place] [facility] Melvin Pao, NP

## 2024-05-15 NOTE — Telephone Encounter (Signed)
 Requested medications are due for refill today.  yes  Requested medications are on the active medications list.  yes  Last refill. 11/18/2023 #90 1 rf  Future visit scheduled.   yes  Notes to clinic.  Expired labs    Requested Prescriptions  Pending Prescriptions Disp Refills   ibuprofen  (ADVIL ) 800 MG tablet [Pharmacy Med Name: IBUPROFEN  800MG  TABLETS] 90 tablet 1    Sig: TAKE 1 TABLET(800 MG) BY MOUTH EVERY 8 HOURS AS NEEDED     Analgesics:  NSAIDS Failed - 05/15/2024  8:42 PM      Failed - Manual Review: Labs are only required if the patient has taken medication for more than 8 weeks.      Failed - Cr in normal range and within 360 days    Creatinine, Ser  Date Value Ref Range Status  04/08/2023 0.72 0.57 - 1.00 mg/dL Final   Creatinine, Urine  Date Value Ref Range Status  06/07/2018 16 mg/dL Final         Failed - HGB in normal range and within 360 days    Hemoglobin  Date Value Ref Range Status  04/08/2023 14.8 11.1 - 15.9 g/dL Final         Failed - PLT in normal range and within 360 days    Platelets  Date Value Ref Range Status  04/08/2023 355 150 - 450 x10E3/uL Final         Failed - HCT in normal range and within 360 days    Hematocrit  Date Value Ref Range Status  04/08/2023 43.4 34.0 - 46.6 % Final         Failed - eGFR is 30 or above and within 360 days    GFR calc Af Amer  Date Value Ref Range Status  08/10/2018 >60 >60 mL/min Final   GFR calc non Af Amer  Date Value Ref Range Status  08/10/2018 >60 >60 mL/min Final   eGFR  Date Value Ref Range Status  04/08/2023 109 >59 mL/min/1.73 Final         Passed - Patient is not pregnant      Passed - Valid encounter within last 12 months    Recent Outpatient Visits           2 months ago Acute cough    [facility] Melvin Pao, NP   3 months ago Morbid obesity [facility])   [place] [facility] Melvin Pao, NP   5 months ago Acquired  hypothyroidism   [facility] Melvin Pao, NP              Signed Prescriptions Disp Refills   SUMAtriptan  (IMITREX ) 100 MG tablet 10 tablet 0    Sig: TAKE 1 TABLET(100 MG) BY MOUTH DAILY AS NEEDED     Neurology:  Migraine Therapy - Triptan Passed - 05/15/2024  8:42 PM      Passed - Last BP in normal range    BP Readings from Last 1 Encounters:  11/18/23 120/82         Passed - Valid encounter within last 12 months    Recent Outpatient Visits           2 months ago Acute cough   [facility] Melvin Pao, NP   3 months ago Morbid obesity [facility])   [place] [facility] Melvin Pao, NP   5 months ago Acquired hypothyroidism    [facility] Melvin Pao, NP

## 2024-05-19 ENCOUNTER — Other Ambulatory Visit (HOSPITAL_COMMUNITY): Payer: Self-pay

## 2024-05-20 ENCOUNTER — Other Ambulatory Visit (HOSPITAL_COMMUNITY): Payer: Self-pay

## 2024-06-08 ENCOUNTER — Encounter: Payer: Self-pay | Admitting: Nurse Practitioner

## 2024-06-08 ENCOUNTER — Ambulatory Visit: Admitting: Nurse Practitioner

## 2024-06-08 ENCOUNTER — Other Ambulatory Visit (HOSPITAL_COMMUNITY): Payer: Self-pay

## 2024-06-08 VITALS — BP 107/77 | HR 91 | Temp 97.6°F | Ht 67.01 in | Wt 251.4 lb

## 2024-06-08 DIAGNOSIS — F419 Anxiety disorder, unspecified: Secondary | ICD-10-CM

## 2024-06-08 DIAGNOSIS — Z1231 Encounter for screening mammogram for malignant neoplasm of breast: Secondary | ICD-10-CM

## 2024-06-08 DIAGNOSIS — Z23 Encounter for immunization: Secondary | ICD-10-CM

## 2024-06-08 DIAGNOSIS — Z6841 Body Mass Index (BMI) 40.0 and over, adult: Secondary | ICD-10-CM

## 2024-06-08 DIAGNOSIS — E039 Hypothyroidism, unspecified: Secondary | ICD-10-CM | POA: Diagnosis not present

## 2024-06-08 DIAGNOSIS — M797 Fibromyalgia: Secondary | ICD-10-CM | POA: Diagnosis not present

## 2024-06-08 MED ORDER — BUDESONIDE-FORMOTEROL FUMARATE 160-4.5 MCG/ACT IN AERO: 2.0000 | INHALATION_SPRAY | Freq: Two times a day (BID) | RESPIRATORY_TRACT | 3 refills | Status: AC

## 2024-06-08 MED ORDER — LEVOTHYROXINE SODIUM 25 MCG PO TABS: ORAL_TABLET | ORAL | 3 refills | Status: AC

## 2024-06-08 MED ORDER — SUMATRIPTAN SUCCINATE 100 MG PO TABS: 100.0000 mg | ORAL_TABLET | ORAL | 2 refills | Status: AC | PRN

## 2024-06-08 MED ORDER — IBUPROFEN 800 MG PO TABS: 800.0000 mg | ORAL_TABLET | Freq: Three times a day (TID) | ORAL | 1 refills | Status: AC | PRN

## 2024-06-08 MED ORDER — DULOXETINE HCL 60 MG PO CPEP: 60.0000 mg | ORAL_CAPSULE | Freq: Two times a day (BID) | ORAL | 1 refills | Status: AC

## 2024-06-08 NOTE — Progress Notes (Signed)
 "  BP 107/77 (BP Location: Left Arm, Patient Position: Sitting, Cuff Size: Large)   Pulse 91   Temp 97.6 F (36.4 C) (Oral)   Ht 5' 7.01 (1.702 m)   Wt 251 lb 6.4 oz (114 kg)   SpO2 96%   BMI 39.37 kg/m    Subjective:    Patient ID: Megan Bennett, female    DOB: [birth year], 38 y.o.   MRN: [id number]  HPI: Megan Bennett is a 38 y.o. female  Chief Complaint  Patient presents with   office visit    Patient stated she is very sore all over from her fibromyalgia and she is tired.   Medication Refill   ANXIETY Patient states her anxiety goes through the roof at the grocery store.  Any other store she is fine with.  She likes the dose of the Cymbalta .  The cymbalta  helps with her fibromyalgia pain. Currently in a flare.  She has tried gabapentin , Pregabalin and flexeril in the past.  None of which were helpful for symptoms.  Denies SI.     09/03/2022    4:17 PM 07/22/2022    2:46 PM 02/25/2022    2:04 PM 01/14/2022    3:12 PM  GAD 7 : Generalized Anxiety Score  Nervous, Anxious, on Edge 0 1 1 1   Control/stop worrying 0 0 0 0  Worry too much - different things 0 0 0 0  Trouble relaxing 0 0 0 0  Restless 0 0 0 0  Easily annoyed or irritable 0 0 0 0  Afraid - awful might happen 0 0 0 0  Total GAD 7 Score 0 1 1 1   Anxiety Difficulty Not difficult at all Somewhat difficult  Not difficult at all   [facility] Office Visit from 09/03/2022 in [facility]  PHQ-9 Total Score 0       HYPOTHYROIDISM Thyroid  control status:controlled Satisfied with current treatment? yes Medication side effects: no Medication compliance: excellent compliance Etiology of hypothyroidism:  Recent dose adjustment:no Fatigue: yes Cold intolerance: no Heat intolerance: no Weight gain: no Weight loss: no Constipation: no Diarrhea/loose stools: no Palpitations: no Lower extremity edema: no Anxiety/depressed mood: yes    Relevant past medical, surgical,  family and social history reviewed and updated as indicated. Interim medical history since our last visit reviewed. Allergies and medications reviewed and updated.  Review of Systems  Constitutional:  Positive for unexpected weight change.  Musculoskeletal:  Positive for arthralgias and myalgias.  Psychiatric/Behavioral:  The patient is nervous/anxious.     Per HPI unless specifically indicated above     Objective:     BP 107/77 (BP Location: Left Arm, Patient Position: Sitting, Cuff Size: Large)   Pulse 91   Temp 97.6 F (36.4 C) (Oral)   Ht 5' 7.01 (1.702 m)   Wt 251 lb 6.4 oz (114 kg)   SpO2 96%   BMI 39.37 kg/m   Wt Readings from Last 3 Encounters:  06/08/24 251 lb 6.4 oz (114 kg)  01/26/24 274 lb (124.3 kg)  11/18/23 277 lb (125.6 kg)    Physical Exam Vitals and nursing note reviewed.  Constitutional:      General: She is not in acute distress.    Appearance: Normal appearance. She is obese. She is diaphoretic. She is not ill-appearing or toxic-appearing.  HENT:     Head: Normocephalic.     Right Ear: External ear normal.     Left Ear: External ear normal.  Nose: Nose normal.     Mouth/Throat:     Mouth: Mucous membranes are moist.     Pharynx: Oropharynx is clear.  Eyes:     General:        Right eye: No discharge.        Left eye: No discharge.     Extraocular Movements: Extraocular movements intact.     Conjunctiva/sclera: Conjunctivae normal.     Pupils: Pupils are equal, round, and reactive to light.  Cardiovascular:     Rate and Rhythm: Normal rate and regular rhythm.     Heart sounds: No murmur heard. Pulmonary:     Effort: Pulmonary effort is normal. No respiratory distress.     Breath sounds: Normal breath sounds. No wheezing or rales.  Musculoskeletal:     Cervical back: Normal range of motion and neck supple.  Skin:    General: Skin is warm.     Capillary Refill: Capillary refill takes less than 2 seconds.  Neurological:     General: No  focal deficit present.     Mental Status: She is alert and oriented to person, place, and time. Mental status is at baseline.  Psychiatric:        Mood and Affect: Mood normal.        Behavior: Behavior normal.        Thought Content: Thought content normal.        Judgment: Judgment normal.     Results for orders placed or performed in visit on 11/18/23  TSH   Collection Time: 11/18/23  1:36 PM  Result Value Ref Range   TSH 7.930 (H) 0.450 - 4.500 uIU/mL  T4, free   Collection Time: 11/18/23  1:36 PM  Result Value Ref Range   Free T4 0.83 0.82 - 1.77 ng/dL      Assessment & Plan:   Problem List Items Addressed This Visit       Endocrine   Acquired hypothyroidism   Chronic.  Controlled.  Continue with current medication regimen of levothyroxine  daily.  Labs ordered today.  Return to clinic in 6 months for reevaluation.  Call sooner if concerns arise.        Relevant Medications   levothyroxine  (SYNTHROID ) 25 MCG tablet   Other Relevant Orders   Comp Met (CMET)   T4, free     Other   Fibromyalgia   Chronic. Ongoing issue.  Exacerbated due to recent weather changes.  Would like to continue with the Duloxetine .  Refills sent today.  Pregabalin, Gabapentin , and Flexeril have not been helpful in the past. Continue with Duloxetine  60mg  BID.  Follow up in 6 months.  Call sooner if concerns arise.      Relevant Medications   ibuprofen  (ADVIL ) 800 MG tablet   DULoxetine  (CYMBALTA ) 60 MG capsule   Anxiety   Chronic.  Controlled.  Continue with current medication regimen of Duloxeting 60mg  BID.  Labs ordered today.  Return to clinic in 6 months for reevaluation.  Call sooner if concerns arise.        Relevant Medications   DULoxetine  (CYMBALTA ) 60 MG capsule   BMI 40.0-44.9, adult (HCC) - Primary   Recommended eating smaller high protein, low fat meals more frequently and exercising 30 mins a day 5 times a week with a goal of 10-15lb weight loss in the next 3 months. Had  been doing really well on Wegovy  until        Other Visit Diagnoses  Need for prophylactic vaccination with Streptococcus pneumoniae (Pneumococcus) and Influenza vaccines       Relevant Orders   Pneumococcal conjugate vaccine 20-valent (Prevnar 20) (Completed)     Encounter for screening mammogram for malignant neoplasm of breast       Relevant Orders   MM 3D SCREENING MAMMOGRAM BILATERAL BREAST        Follow up plan: Return in about 6 months (around 12/07/2024) for Physical and Fasting labs.      "

## 2024-06-08 NOTE — Assessment & Plan Note (Signed)
 Chronic. Ongoing issue.  Exacerbated due to recent weather changes.  Would like to continue with the Duloxetine .  Refills sent today.  Pregabalin, Gabapentin , and Flexeril have not been helpful in the past. Continue with Duloxetine  60mg  BID.  Follow up in 6 months.  Call sooner if concerns arise.

## 2024-06-08 NOTE — Assessment & Plan Note (Signed)
 Recommended eating smaller high protein, low fat meals more frequently and exercising 30 mins a day 5 times a week with a goal of 10-15lb weight loss in the next 3 months. Had been doing really well on Wegovy  until

## 2024-06-08 NOTE — Assessment & Plan Note (Signed)
 Chronic.  Controlled.  Continue with current medication regimen of Duloxeting 60mg  BID.  Labs ordered today.  Return to clinic in 6 months for reevaluation.  Call sooner if concerns arise.

## 2024-06-08 NOTE — Patient Instructions (Signed)
 Please call to schedule your mammogram and/or bone density: [facility] at [facility]  Address: [address], [place], [place] [address] Phone: [phone]  [facility] [address] [place],  [place]  [address] Phone: [phone]

## 2024-06-08 NOTE — Assessment & Plan Note (Signed)
Chronic.  Controlled.  Continue with current medication regimen of levothyroxine daily.  Labs ordered today.  Return to clinic in 6 months for reevaluation.  Call sooner if concerns arise.   

## 2024-06-09 ENCOUNTER — Ambulatory Visit: Payer: Self-pay | Admitting: Nurse Practitioner

## 2024-06-09 LAB — COMPREHENSIVE METABOLIC PANEL WITH GFR
ALT: 12 IU/L (ref 0–32)
AST: 12 IU/L (ref 0–40)
Albumin: 3.9 g/dL (ref 3.9–4.9)
Alkaline Phosphatase: 85 IU/L (ref 41–116)
BUN/Creatinine Ratio: 18 (ref 9–23)
BUN: 11 mg/dL (ref 6–24)
Bilirubin Total: 0.3 mg/dL (ref 0.0–1.2)
CO2: 24 mmol/L (ref 20–29)
Calcium: 9.4 mg/dL (ref 8.7–10.2)
Chloride: 102 mmol/L (ref 96–106)
Creatinine, Ser: 0.62 mg/dL (ref 0.57–1.00)
Globulin, Total: 3 g/dL (ref 1.5–4.5)
Glucose: 86 mg/dL (ref 70–99)
Potassium: 4.1 mmol/L (ref 3.5–5.2)
Sodium: 138 mmol/L (ref 134–144)
Total Protein: 6.9 g/dL (ref 6.0–8.5)
eGFR: 115 mL/min/1.73

## 2024-06-09 LAB — T4, FREE: Free T4: 0.85 ng/dL (ref 0.82–1.77)

## 2024-06-30 ENCOUNTER — Encounter: Payer: Self-pay | Admitting: Nurse Practitioner

## 2024-07-01 ENCOUNTER — Telehealth: Payer: Self-pay

## 2024-07-02 NOTE — Telephone Encounter (Signed)
 Patient has been scheduled.

## 2024-07-12 ENCOUNTER — Ambulatory Visit: Admitting: Nurse Practitioner

## 2024-07-14 ENCOUNTER — Encounter: Payer: Self-pay | Admitting: Nurse Practitioner

## 2024-07-14 ENCOUNTER — Telehealth: Admitting: Nurse Practitioner

## 2024-07-14 VITALS — Ht 67.01 in | Wt 251.0 lb

## 2024-07-14 DIAGNOSIS — Z6841 Body Mass Index (BMI) 40.0 and over, adult: Secondary | ICD-10-CM

## 2024-07-14 MED ORDER — WEGOVY 0.25 MG/0.5ML ~~LOC~~ SOAJ: 0.2500 mg | SUBCUTANEOUS | 0 refills | Status: AC

## 2024-07-14 NOTE — Assessment & Plan Note (Signed)
 Chronic.  Not well controlled.  Patient has tried diet and exercise for weight loss without success.  Will start [facility] 0.25mg  weekly.  Will increase to [facility] 0.5mg  weekly after the first 4 weeks.  Discussed how to inject medication.  Discussed side effects and benefits of medication.  Follow up in 2 months.  Call sooner if concerns arise.

## 2024-07-14 NOTE — Progress Notes (Signed)
 "  Ht 5' 7.01 (1.702 m)   Wt 251 lb (113.9 kg)   BMI 39.30 kg/m    Subjective:    Patient ID: Megan Bennett, female    DOB: Aug 28, 1985, 39 y.o.   MRN: [id number]  HPI: Megan Bennett is a 39 y.o. female  Chief Complaint  Patient presents with   Weight Management Screening    Wants to speak about wegovy . She stated she is having joint pain and back pain.   WEIGHT GAIN Duration: years Previous attempts at weight loss: yes Complications of obesity: hypothyroid Peak weight: 277 lb Weight loss goal:  Weight loss to date:  Requesting obesity pharmacotherapy: yes Current weight loss supplements/medications: no Previous weight loss supplements/meds: yes Calories:   Clinical coverage for weight loss GLP's   Medication being dispensed is Zepbound 2 mL/28 days. Titration doses are 2 mL/28 days.   [x]  Product being prescribed is FDA approved for the indication, age, weight (if applicable) and not does not exceed dosing limits per the Prescribing Information per the clinical conditions for use.  [x]  Patient's baseline weight measured within the last 45 days as required by provider before dispensing.  []  Patient has participated in 6 months or greater of structured nutrition therapy and structured physical therapy as directed by their physician, unless physical therapy is contraindicated based on patient's medical history and will continue structured nutrition therapy and structured physical therapy while using any pharmacological interventions including GLP-1's.   [x]  Patient is attempting a repeat weight loss course of therapy and One of the following:   [x]  The beneficiary is 41 years of age or over and has ONE of the following:  [x]  A BMI greater than or equal to 30 kg/m2  []  A BMI greater than or equal to 27 kg/m2 with at least one weight-related comorbidity/risk factor/complication (i.e. hypertension, type 2 diabetes, obstructive sleep apnea, cardiovascular  disease, dyslipidemia)  If patient has one weight-related comorbidity/risk factor/complication (i.e. hypertension, type 2 diabetes, obstructive sleep apnea, cardiovascular disease, dyslipidemia), please list, Patient suffers from weight-related comorbidity/risk factor/complication hypothyroid Last BMI/Weight/Height recorded .QONTJFA[88   .QONTJFA[85  BMI Readings from Last 1 Encounters:  07/14/24 39.30 kg/m       Relevant past medical, surgical, family and social history reviewed and updated as indicated. Interim medical history since our last visit reviewed. Allergies and medications reviewed and updated.  Review of Systems  Constitutional:  Positive for unexpected weight change.    Per HPI unless specifically indicated above     Objective:    Ht 5' 7.01 (1.702 m)   Wt 251 lb (113.9 kg)   BMI 39.30 kg/m   Wt Readings from Last 3 Encounters:  07/14/24 251 lb (113.9 kg)  06/08/24 251 lb 6.4 oz (114 kg)  01/26/24 274 lb (124.3 kg)    Physical Exam Vitals and nursing note reviewed.  HENT:     Head: Normocephalic.     Right Ear: Hearing normal.     Left Ear: Hearing normal.     Nose: Nose normal.  Eyes:     Pupils: Pupils are equal, round, and reactive to light.  Pulmonary:     Effort: Pulmonary effort is normal. No respiratory distress.  Neurological:     Mental Status: She is alert.  Psychiatric:        Mood and Affect: Mood normal.        Behavior: Behavior normal.        Thought Content: Thought content normal.  Judgment: Judgment normal.     Results for orders placed or performed in visit on 06/08/24  Comp Met (CMET)   Collection Time: 06/08/24 11:21 AM  Result Value Ref Range   Glucose 86 70 - 99 mg/dL   BUN 11 6 - 24 mg/dL   Creatinine, Ser 9.37 0.57 - 1.00 mg/dL   eGFR 884 >40 fO/fpw/8.26   BUN/Creatinine Ratio 18 9 - 23   Sodium 138 134 - 144 mmol/L   Potassium 4.1 3.5 - 5.2 mmol/L   Chloride 102 96 - 106 mmol/L   CO2 24 20 - 29 mmol/L    Calcium  9.4 8.7 - 10.2 mg/dL   Total Protein 6.9 6.0 - 8.5 g/dL   Albumin 3.9 3.9 - 4.9 g/dL   Globulin, Total 3.0 1.5 - 4.5 g/dL   Bilirubin Total 0.3 0.0 - 1.2 mg/dL   Alkaline Phosphatase 85 41 - 116 IU/L   AST 12 0 - 40 IU/L   ALT 12 0 - 32 IU/L  T4, free   Collection Time: 06/08/24 11:21 AM  Result Value Ref Range   Free T4 0.85 0.82 - 1.77 ng/dL      Assessment & Plan:   Problem List Items Addressed This Visit       Other   BMI 40.0-44.9, adult (HCC) - Primary   Chronic.  Not well controlled.  Patient has tried diet and exercise for weight loss without success.  Will start Wegovy  0.25mg  weekly.  Will increase to Wegovy  0.5mg  weekly after the first 4 weeks.  Discussed how to inject medication.  Discussed side effects and benefits of medication.  Follow up in 2 months.  Call sooner if concerns arise.        Relevant Medications   semaglutide -weight management (WEGOVY ) 0.25 MG/0.5ML SOAJ SQ injection     Follow up plan: Return in about 2 months (around 09/11/2024) for Weight Managment (virtual).   This visit was completed via MyChart due to the restrictions of the COVID-19 pandemic. All issues as above were discussed and addressed. Physical exam was done as above through visual confirmation on MyChart. If it was felt that the patient should be evaluated in the office, they were directed there. The patient verbally consented to this visit. Location of the patient: Home Location of the provider: Office Those involved with this call:  Provider: Darice Petty, NP CMA: Joya Louder, CMA Front Desk/Registration: Claretta Maiden This encounter was conducted via video.  I spent 30 minutes dedicated to the care of this patient on the date of this encounter to include previsit review of symptoms, plan of care and follow up, face to face time with the patient, and post visit ordering of testing.      "

## 2024-12-07 ENCOUNTER — Encounter: Admitting: Nurse Practitioner
